# Patient Record
Sex: Female | Born: 1955 | Race: White | Hispanic: No | Smoking: Never smoker
Health system: Southern US, Community
[De-identification: ages and names within clinical notes are randomized; demographics above are authoritative.]

## PROBLEM LIST (undated history)

## (undated) DIAGNOSIS — IMO0002 Reserved for concepts with insufficient information to code with codable children: Secondary | ICD-10-CM

## (undated) DIAGNOSIS — I10 Essential (primary) hypertension: Secondary | ICD-10-CM

## (undated) DIAGNOSIS — T7840XA Allergy, unspecified, initial encounter: Secondary | ICD-10-CM

## (undated) DIAGNOSIS — Z01419 Encounter for gynecological examination (general) (routine) without abnormal findings: Secondary | ICD-10-CM

## (undated) DIAGNOSIS — E785 Hyperlipidemia, unspecified: Secondary | ICD-10-CM

## (undated) HISTORY — DX: Hyperlipidemia, unspecified: E78.5

## (undated) HISTORY — DX: Gilbert syndrome: E80.4

## (undated) HISTORY — DX: Essential (primary) hypertension: I10

## (undated) HISTORY — DX: Reserved for concepts with insufficient information to code with codable children: IMO0002

## (undated) HISTORY — PX: FRACTURE SURGERY: SHX138

## (undated) HISTORY — PX: TUBAL LIGATION: SHX77

## (undated) HISTORY — DX: Allergy, unspecified, initial encounter: T78.40XA

## (undated) HISTORY — DX: Encounter for gynecological examination (general) (routine) without abnormal findings: Z01.419

## (undated) HISTORY — PX: OTHER SURGICAL HISTORY: SHX169

---

## 2010-11-09 ENCOUNTER — Encounter: Payer: Self-pay | Admitting: Family Medicine

## 2010-11-09 ENCOUNTER — Ambulatory Visit (INDEPENDENT_AMBULATORY_CARE_PROVIDER_SITE_OTHER): Payer: BC Managed Care – PPO | Admitting: Family Medicine

## 2010-11-09 VITALS — BP 114/78 | HR 93 | Temp 97.3°F | Ht 66.0 in | Wt 135.0 lb

## 2010-11-09 DIAGNOSIS — E876 Hypokalemia: Secondary | ICD-10-CM

## 2010-11-09 DIAGNOSIS — Z0000 Encounter for general adult medical examination without abnormal findings: Secondary | ICD-10-CM

## 2010-11-09 DIAGNOSIS — Z23 Encounter for immunization: Secondary | ICD-10-CM

## 2010-11-09 LAB — CBC WITH DIFFERENTIAL/PLATELET
Basophils Relative: 0.7 % (ref 0.0–3.0)
Eosinophils Relative: 1.6 % (ref 0.0–5.0)
Hemoglobin: 14.9 g/dL (ref 12.0–15.0)
Lymphocytes Relative: 30.1 % (ref 12.0–46.0)
Monocytes Relative: 4.2 % (ref 3.0–12.0)
Neutro Abs: 3.8 10*3/uL (ref 1.4–7.7)
RBC: 4.61 Mil/uL (ref 3.87–5.11)
WBC: 6 10*3/uL (ref 4.5–10.5)

## 2010-11-09 LAB — BASIC METABOLIC PANEL
BUN: 16 mg/dL (ref 6–23)
GFR: 87.18 mL/min (ref >60.00)
Potassium: 2.9 mEq/L — ABNORMAL LOW (ref 3.5–5.1)

## 2010-11-09 LAB — TSH: TSH: 1.29 u[IU]/mL (ref 0.35–5.50)

## 2010-11-09 LAB — POCT URINALYSIS DIPSTICK
Bilirubin, UA: NEGATIVE
Ketones, UA: NEGATIVE
Leukocytes, UA: NEGATIVE
pH, UA: 7

## 2010-11-09 LAB — LIPID PANEL
HDL: 74.9 mg/dL (ref >39.00)
VLDL: 16 mg/dL (ref 0.0–40.0)

## 2010-11-09 LAB — HEPATIC FUNCTION PANEL
AST: 20 U/L (ref 0–37)
Total Bilirubin: 2.8 mg/dL — ABNORMAL HIGH (ref 0.3–1.2)

## 2010-11-09 MED ORDER — INDAPAMIDE 1.25 MG PO TABS: 1.2500 mg | ORAL_TABLET | Freq: Two times a day (BID) | ORAL | Status: DC

## 2010-11-09 NOTE — Progress Notes (Signed)
  Subjective:    Patient ID: Megan Middleton, female    DOB: 18-Apr-1955, 55 y.o.   MRN: [id number]  HPI 55 yr old female here to establish and for a cpx. She and her husband moved here from [place] last July. 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 She feels fine and has no complaints.    Review of Systems  Constitutional: Negative.   HENT: Negative.   Eyes: Negative.   Respiratory: Negative.   Cardiovascular: Negative.   Gastrointestinal: Negative.   Genitourinary: Negative for dysuria, urgency, frequency, hematuria, flank pain, decreased urine volume, enuresis, difficulty urinating, pelvic pain and dyspareunia.  Musculoskeletal: Negative.   Skin: Negative.   Neurological: Negative.   Hematological: Negative.   Psychiatric/Behavioral: Negative.        Objective:   Physical Exam  Constitutional: She is oriented to person, place, and time. She appears well-developed and well-nourished. No distress.  HENT:  Head: Normocephalic and atraumatic.  Right Ear: External ear normal.  Left Ear: External ear normal.  Nose: Nose normal.  Mouth/Throat: Oropharynx is clear and moist. No oropharyngeal exudate.  Eyes: Conjunctivae and EOM are normal. Pupils are equal, round, and reactive to light. No scleral icterus.  Neck: Normal range of motion. Neck supple. No JVD present. No thyromegaly present.  Cardiovascular: Normal rate, regular rhythm, normal heart sounds and intact distal pulses.  Exam reveals no gallop and no friction rub.   No murmur heard.      EKG normal except for LVH  Pulmonary/Chest: Effort normal and breath sounds normal. No respiratory distress. She has no wheezes. She has no rales. She exhibits no tenderness.  Abdominal: Soft. Bowel sounds are normal. She exhibits no distension and no mass. There is no tenderness. There is no rebound and no guarding.  Musculoskeletal: Normal range of motion. She exhibits no edema and no tenderness.    Lymphadenopathy:    She has no cervical adenopathy.  Neurological: She is alert and oriented to person, place, and time. She has normal reflexes. No cranial nerve deficit. She exhibits normal muscle tone. Coordination normal.  Skin: Skin is warm and dry. No rash noted. No erythema.  Psychiatric: She has a normal mood and affect. Her behavior is normal. Judgment and thought content normal.          Assessment & Plan:  Well exam. Get fasting labs today

## 2010-11-09 NOTE — Progress Notes (Signed)
Addended by: Aniceto Boss A on: 11/09/2010 04:16 PM   Modules accepted: Orders

## 2010-11-11 ENCOUNTER — Telehealth: Payer: Self-pay | Admitting: Family Medicine

## 2010-11-11 ENCOUNTER — Encounter: Payer: Self-pay | Admitting: Family Medicine

## 2010-11-11 MED ORDER — POTASSIUM CHLORIDE 20 MEQ PO PACK: 20.0000 meq | PACK | Freq: Every day | ORAL | Status: DC

## 2010-11-11 MED ORDER — POTASSIUM CHLORIDE 20 MEQ PO PACK: 20.0000 meq | PACK | Freq: Two times a day (BID) | ORAL | Status: DC

## 2010-11-11 NOTE — Progress Notes (Signed)
Addended by: Aniceto Boss A on: 11/11/2010 12:26 PM   Modules accepted: Orders

## 2010-11-11 NOTE — Telephone Encounter (Signed)
Message copied by Baldemar Friday on Fri Nov 11, 2010 12:26 PM ------      Message from: Gershon Crane A      Created: Thu Nov 10, 2010  1:25 PM       Normal except low potassium. Call in Klor-con 20 mEq a day for one year. Recheck a BMET in 30 days.

## 2010-11-11 NOTE — Telephone Encounter (Signed)
I spoke with pt and gave results, put a copy in mail, put a future lab order in and sent script e-scribe.

## 2010-11-14 ENCOUNTER — Other Ambulatory Visit: Payer: Self-pay | Admitting: *Deleted

## 2010-11-14 MED ORDER — POTASSIUM CHLORIDE CRYS ER 20 MEQ PO TBCR: 20.0000 meq | EXTENDED_RELEASE_TABLET | Freq: Every day | ORAL | Status: DC

## 2010-11-14 NOTE — Progress Notes (Signed)
Per patient request and VO Dr Clent Ridges, changed therapy to tablet form/SLS

## 2010-11-15 ENCOUNTER — Telehealth: Payer: Self-pay | Admitting: *Deleted

## 2010-11-15 NOTE — Telephone Encounter (Signed)
Request for lab results to be mailed. Done. Pt informed.

## 2010-12-05 ENCOUNTER — Ambulatory Visit: Payer: BC Managed Care – PPO | Admitting: Family Medicine

## 2010-12-08 ENCOUNTER — Other Ambulatory Visit: Payer: Self-pay | Admitting: Obstetrics and Gynecology

## 2010-12-08 DIAGNOSIS — N6009 Solitary cyst of unspecified breast: Secondary | ICD-10-CM

## 2010-12-09 ENCOUNTER — Other Ambulatory Visit (INDEPENDENT_AMBULATORY_CARE_PROVIDER_SITE_OTHER): Payer: BC Managed Care – PPO

## 2010-12-09 DIAGNOSIS — E876 Hypokalemia: Secondary | ICD-10-CM

## 2010-12-09 LAB — BASIC METABOLIC PANEL
BUN: 16 mg/dL (ref 6–23)
CO2: 30 mEq/L (ref 19–32)
Calcium: 9.6 mg/dL (ref 8.4–10.5)
Chloride: 100 mEq/L (ref 96–112)
Creatinine, Ser: 0.8 mg/dL (ref 0.4–1.2)

## 2010-12-12 ENCOUNTER — Other Ambulatory Visit: Payer: Self-pay | Admitting: Family Medicine

## 2010-12-12 MED ORDER — POTASSIUM CHLORIDE CRYS ER 20 MEQ PO TBCR: 20.0000 meq | EXTENDED_RELEASE_TABLET | Freq: Two times a day (BID) | ORAL | Status: DC

## 2010-12-12 NOTE — Progress Notes (Signed)
Quick Note:  Pt informed on home VM, will send to pt pharmacy ______

## 2011-01-02 ENCOUNTER — Ambulatory Visit
Admission: RE | Admit: 2011-01-02 | Discharge: 2011-01-02 | Disposition: A | Discharge status: Home / Self Care | Payer: BC Managed Care – PPO | Source: Ambulatory Visit | Attending: Obstetrics and Gynecology | Admitting: Obstetrics and Gynecology

## 2011-01-02 DIAGNOSIS — N6009 Solitary cyst of unspecified breast: Secondary | ICD-10-CM

## 2011-04-13 ENCOUNTER — Telehealth: Payer: Self-pay | Admitting: Family Medicine

## 2011-04-13 DIAGNOSIS — E876 Hypokalemia: Secondary | ICD-10-CM

## 2011-04-13 NOTE — Telephone Encounter (Signed)
Pt is sch for 05-15-2011

## 2011-04-13 NOTE — Telephone Encounter (Signed)
Pt stated she suppose to have a repeated BMET in 6 month from 11-2010. Can I sch? Pt potassium was low.

## 2011-04-13 NOTE — Telephone Encounter (Signed)
I went ahead and ordered the test, if you could just schedule her. Thanks

## 2011-05-04 ENCOUNTER — Other Ambulatory Visit: Payer: BC Managed Care – PPO

## 2011-05-11 ENCOUNTER — Other Ambulatory Visit (INDEPENDENT_AMBULATORY_CARE_PROVIDER_SITE_OTHER): Payer: BC Managed Care – PPO

## 2011-05-11 DIAGNOSIS — E876 Hypokalemia: Secondary | ICD-10-CM

## 2011-05-11 LAB — BASIC METABOLIC PANEL
BUN: 16 mg/dL (ref 6–23)
Chloride: 100 mEq/L (ref 96–112)
Creatinine, Ser: 0.7 mg/dL (ref 0.4–1.2)
Glucose, Bld: 91 mg/dL (ref 70–99)
Potassium: 3.5 mEq/L (ref 3.5–5.1)

## 2011-05-15 ENCOUNTER — Other Ambulatory Visit: Payer: BC Managed Care – PPO

## 2011-05-16 NOTE — Progress Notes (Signed)
Quick Note:  Pt informed ______ 

## 2011-11-17 ENCOUNTER — Other Ambulatory Visit: Payer: Self-pay | Admitting: Obstetrics and Gynecology

## 2011-11-17 DIAGNOSIS — Z1231 Encounter for screening mammogram for malignant neoplasm of breast: Secondary | ICD-10-CM

## 2011-11-24 ENCOUNTER — Other Ambulatory Visit: Payer: Self-pay | Admitting: Family Medicine

## 2011-12-05 ENCOUNTER — Encounter: Payer: Self-pay | Admitting: Family Medicine

## 2011-12-05 ENCOUNTER — Ambulatory Visit (INDEPENDENT_AMBULATORY_CARE_PROVIDER_SITE_OTHER): Payer: BC Managed Care – PPO | Admitting: Family Medicine

## 2011-12-05 VITALS — BP 118/78 | HR 85 | Temp 98.2°F | Ht 67.0 in | Wt 138.0 lb

## 2011-12-05 DIAGNOSIS — Z0000 Encounter for general adult medical examination without abnormal findings: Secondary | ICD-10-CM

## 2011-12-05 LAB — BASIC METABOLIC PANEL
Chloride: 101 mEq/L (ref 96–112)
Creatinine, Ser: 0.7 mg/dL (ref 0.4–1.2)
Potassium: 3.3 mEq/L — ABNORMAL LOW (ref 3.5–5.1)

## 2011-12-05 LAB — HEPATIC FUNCTION PANEL
ALT: 15 U/L (ref 0–35)
AST: 23 U/L (ref 0–37)
Bilirubin, Direct: 0.2 mg/dL (ref 0.0–0.3)
Total Bilirubin: 1.7 mg/dL — ABNORMAL HIGH (ref 0.3–1.2)

## 2011-12-05 LAB — LIPID PANEL
Total CHOL/HDL Ratio: 4
VLDL: 33.4 mg/dL (ref 0.0–40.0)

## 2011-12-05 LAB — CBC WITH DIFFERENTIAL/PLATELET
Basophils Absolute: 0.1 10*3/uL (ref 0.0–0.1)
Basophils Relative: 1.2 % (ref 0.0–3.0)
Eosinophils Relative: 2.4 % (ref 0.0–5.0)
HCT: 42.8 % (ref 36.0–46.0)
Hemoglobin: 14.4 g/dL (ref 12.0–15.0)
Lymphs Abs: 1.9 10*3/uL (ref 0.7–4.0)
Monocytes Relative: 4.9 % (ref 3.0–12.0)
Neutro Abs: 4.3 10*3/uL (ref 1.4–7.7)
RBC: 4.5 Mil/uL (ref 3.87–5.11)
RDW: 12.4 % (ref 11.5–14.6)

## 2011-12-05 LAB — TSH: TSH: 1.58 u[IU]/mL (ref 0.35–5.50)

## 2011-12-05 LAB — POCT URINALYSIS DIPSTICK
Ketones, UA: NEGATIVE
Protein, UA: NEGATIVE
Spec Grav, UA: 1.01

## 2011-12-05 MED ORDER — INDAPAMIDE 1.25 MG PO TABS: 2.5000 mg | ORAL_TABLET | Freq: Every day | ORAL | Status: DC

## 2011-12-05 NOTE — Progress Notes (Signed)
  Subjective:    Patient ID: Megan Middleton, female    DOB: 23-Jul-1955, 56 y.o.   MRN: [id number]  HPI 56 yr old female for a cpx. She feels well and has no concerns.    Review of Systems  Constitutional: Negative.  Negative for fever, diaphoresis, activity change, appetite change, fatigue and unexpected weight change.  HENT: Negative.  Negative for hearing loss, ear pain, nosebleeds, congestion, sore throat, trouble swallowing, neck pain, neck stiffness, voice change and tinnitus.   Eyes: Negative.  Negative for photophobia, pain, discharge, redness and visual disturbance.  Respiratory: Negative.  Negative for apnea, cough, choking, chest tightness, shortness of breath, wheezing and stridor.   Cardiovascular: Negative.  Negative for chest pain, palpitations and leg swelling.  Gastrointestinal: Negative.  Negative for nausea, vomiting, abdominal pain, diarrhea, constipation, blood in stool, abdominal distention and rectal pain.  Genitourinary: Negative.  Negative for dysuria, urgency, frequency, hematuria, flank pain, vaginal bleeding, vaginal discharge, enuresis, difficulty urinating, vaginal pain and menstrual problem.  Musculoskeletal: Negative.  Negative for myalgias, back pain, joint swelling, arthralgias and gait problem.  Skin: Negative.  Negative for color change, pallor, rash and wound.  Neurological: Negative.  Negative for dizziness, tremors, seizures, syncope, speech difficulty, weakness, light-headedness, numbness and headaches.  Hematological: Negative.  Negative for adenopathy. Does not bruise/bleed easily.  Psychiatric/Behavioral: Negative.  Negative for hallucinations, behavioral problems, confusion, sleep disturbance, dysphoric mood and agitation. The patient is not nervous/anxious.        Objective:   Physical Exam  Constitutional: She appears well-developed and well-nourished. No distress.  HENT:  Head: Normocephalic and atraumatic.  Right Ear: External ear normal.    Left Ear: External ear normal.  Nose: Nose normal.  Mouth/Throat: Oropharynx is clear and moist. No oropharyngeal exudate.  Eyes: Conjunctivae normal and EOM are normal. Pupils are equal, round, and reactive to light. Right eye exhibits no discharge. Left eye exhibits no discharge. No scleral icterus.  Neck: Normal range of motion. Neck supple. No JVD present. No thyromegaly present.  Cardiovascular: Normal rate, regular rhythm, normal heart sounds and intact distal pulses.  Exam reveals no gallop and no friction rub.   No murmur heard. Pulmonary/Chest: Effort normal and breath sounds normal. No stridor. No respiratory distress. She has no wheezes. She has no rales. She exhibits no tenderness.  Abdominal: Soft. Normal appearance and bowel sounds are normal. She exhibits no distension, no abdominal bruit, no ascites and no mass. There is no hepatosplenomegaly. There is no tenderness. There is no rigidity, no rebound and no guarding. No hernia.  Genitourinary: Rectum normal, vagina normal and uterus normal. No breast swelling, tenderness, discharge or bleeding. Cervix exhibits no motion tenderness, no discharge and no friability. Right adnexum displays no mass, no tenderness and no fullness. Left adnexum displays no mass, no tenderness and no fullness. No erythema, tenderness or bleeding around the vagina. No vaginal discharge found.  Musculoskeletal: Normal range of motion. She exhibits no edema and no tenderness.  Lymphadenopathy:    She has no cervical adenopathy.  Neurological: She is alert. She has normal reflexes. No cranial nerve deficit. She exhibits normal muscle tone. Coordination normal.  Skin: Skin is warm and dry. No rash noted. She is not diaphoretic. No erythema. No pallor.  Psychiatric: She has a normal mood and affect. Her behavior is normal. Judgment and thought content normal.          Assessment & Plan:  Well exam

## 2011-12-07 ENCOUNTER — Encounter: Payer: Self-pay | Admitting: Family Medicine

## 2011-12-07 MED ORDER — POTASSIUM CHLORIDE CRYS ER 20 MEQ PO TBCR
20.0000 meq | EXTENDED_RELEASE_TABLET | Freq: Every day | ORAL | Status: DC
Start: 2011-12-07 — End: 2012-12-05

## 2011-12-07 MED ORDER — POTASSIUM CHLORIDE CRYS ER 20 MEQ PO TBCR: 20.0000 meq | EXTENDED_RELEASE_TABLET | Freq: Every day | ORAL | Status: DC

## 2011-12-07 NOTE — Addendum Note (Signed)
Addended by: Aniceto Boss A on: 12/07/2011 11:28 AM   Modules accepted: Orders

## 2011-12-07 NOTE — Progress Notes (Signed)
Quick Note:  I spoke with pt and called in script, also put a copy of results in mail. ______

## 2011-12-07 NOTE — Addendum Note (Signed)
Addended by: Aniceto Boss A on: 12/07/2011 11:41 AM   Modules accepted: Orders

## 2012-01-03 ENCOUNTER — Ambulatory Visit: Payer: BC Managed Care – PPO

## 2012-01-19 ENCOUNTER — Ambulatory Visit
Admission: RE | Admit: 2012-01-19 | Discharge: 2012-01-19 | Disposition: A | Discharge status: Home / Self Care | Payer: BC Managed Care – PPO | Source: Ambulatory Visit | Attending: Obstetrics and Gynecology | Admitting: Obstetrics and Gynecology

## 2012-01-19 DIAGNOSIS — Z1231 Encounter for screening mammogram for malignant neoplasm of breast: Secondary | ICD-10-CM

## 2012-02-22 ENCOUNTER — Other Ambulatory Visit: Payer: Self-pay | Admitting: Family Medicine

## 2012-10-15 ENCOUNTER — Other Ambulatory Visit: Payer: Self-pay

## 2012-10-15 DIAGNOSIS — Z1231 Encounter for screening mammogram for malignant neoplasm of breast: Secondary | ICD-10-CM

## 2012-11-28 ENCOUNTER — Other Ambulatory Visit: Payer: Self-pay

## 2012-12-05 ENCOUNTER — Encounter: Payer: Self-pay | Admitting: Family Medicine

## 2012-12-05 ENCOUNTER — Ambulatory Visit (INDEPENDENT_AMBULATORY_CARE_PROVIDER_SITE_OTHER): Payer: BC Managed Care – PPO | Admitting: Family Medicine

## 2012-12-05 VITALS — BP 126/80 | HR 82 | Temp 98.4°F | Ht 66.5 in | Wt 138.0 lb

## 2012-12-05 DIAGNOSIS — Z0000 Encounter for general adult medical examination without abnormal findings: Secondary | ICD-10-CM

## 2012-12-05 DIAGNOSIS — E785 Hyperlipidemia, unspecified: Secondary | ICD-10-CM | POA: Insufficient documentation

## 2012-12-05 DIAGNOSIS — I10 Essential (primary) hypertension: Secondary | ICD-10-CM

## 2012-12-05 LAB — HEPATIC FUNCTION PANEL
ALT: 18 U/L (ref 0–35)
AST: 22 U/L (ref 0–37)
Albumin: 4.6 g/dL (ref 3.5–5.2)
Alkaline Phosphatase: 78 U/L (ref 39–117)
Bilirubin, Direct: 0.2 mg/dL (ref 0.0–0.3)
Total Protein: 7.5 g/dL (ref 6.0–8.3)

## 2012-12-05 LAB — CBC WITH DIFFERENTIAL/PLATELET
Basophils Absolute: 0 10*3/uL (ref 0.0–0.1)
Basophils Relative: 0.8 % (ref 0.0–3.0)
Eosinophils Absolute: 0.1 10*3/uL (ref 0.0–0.7)
Lymphocytes Relative: 32.6 % (ref 12.0–46.0)
Lymphs Abs: 1.8 10*3/uL (ref 0.7–4.0)
MCHC: 33.8 g/dL (ref 30.0–36.0)
MCV: 93.7 fl (ref 78.0–100.0)
Monocytes Absolute: 0.3 10*3/uL (ref 0.1–1.0)
Monocytes Relative: 5.2 % (ref 3.0–12.0)
Neutrophils Relative %: 58.7 % (ref 43.0–77.0)
RBC: 4.59 Mil/uL (ref 3.87–5.11)
RDW: 12.8 % (ref 11.5–14.6)

## 2012-12-05 LAB — POCT URINALYSIS DIPSTICK
Bilirubin, UA: NEGATIVE
Blood, UA: NEGATIVE
Leukocytes, UA: NEGATIVE
Nitrite, UA: NEGATIVE
Protein, UA: NEGATIVE
Urobilinogen, UA: 0.2
pH, UA: 7

## 2012-12-05 LAB — LIPID PANEL
Total CHOL/HDL Ratio: 3
Triglycerides: 76 mg/dL (ref 0.0–149.0)

## 2012-12-05 LAB — BASIC METABOLIC PANEL
CO2: 34 mEq/L — ABNORMAL HIGH (ref 19–32)
Calcium: 9.9 mg/dL (ref 8.4–10.5)
Chloride: 98 mEq/L (ref 96–112)
Glucose, Bld: 89 mg/dL (ref 70–99)
Sodium: 140 mEq/L (ref 135–145)

## 2012-12-05 LAB — TSH: TSH: 1.19 u[IU]/mL (ref 0.35–5.50)

## 2012-12-05 MED ORDER — POTASSIUM CHLORIDE CRYS ER 20 MEQ PO TBCR: 20.0000 meq | EXTENDED_RELEASE_TABLET | Freq: Every day | ORAL | Status: DC

## 2012-12-05 MED ORDER — INDAPAMIDE 1.25 MG PO TABS: ORAL_TABLET | ORAL | Status: DC

## 2012-12-05 NOTE — Progress Notes (Signed)
Pre visit review using our clinic review tool, if applicable. No additional management support is needed unless otherwise documented below in the visit note. 

## 2012-12-05 NOTE — Progress Notes (Signed)
  Subjective:    Patient ID: Megan Middleton, female    DOB: 30-Jun-1955, 57 y.o.   MRN: [id number]  HPI 57 yr old female for a cpx. She feels great.    Review of Systems  Constitutional: Negative.   HENT: Negative.   Eyes: Negative.   Respiratory: Negative.   Cardiovascular: Negative.   Gastrointestinal: Negative.   Genitourinary: Negative for dysuria, urgency, frequency, hematuria, flank pain, decreased urine volume, enuresis, difficulty urinating, pelvic pain and dyspareunia.  Musculoskeletal: Negative.   Skin: Negative.   Neurological: Negative.   Psychiatric/Behavioral: Negative.        Objective:   Physical Exam  Constitutional: She is oriented to person, place, and time. She appears well-developed and well-nourished. No distress.  HENT:  Head: Normocephalic and atraumatic.  Right Ear: External ear normal.  Left Ear: External ear normal.  Nose: Nose normal.  Mouth/Throat: Oropharynx is clear and moist. No oropharyngeal exudate.  Eyes: Conjunctivae and EOM are normal. Pupils are equal, round, and reactive to light. No scleral icterus.  Neck: Normal range of motion. Neck supple. No JVD present. No thyromegaly present.  Cardiovascular: Normal rate, regular rhythm, normal heart sounds and intact distal pulses.  Exam reveals no gallop and no friction rub.   No murmur heard. Pulmonary/Chest: Effort normal and breath sounds normal. No respiratory distress. She has no wheezes. She has no rales. She exhibits no tenderness.  Abdominal: Soft. Bowel sounds are normal. She exhibits no distension and no mass. There is no tenderness. There is no rebound and no guarding.  Musculoskeletal: Normal range of motion. She exhibits no edema and no tenderness.  Lymphadenopathy:    She has no cervical adenopathy.  Neurological: She is alert and oriented to person, place, and time. She has normal reflexes. No cranial nerve deficit. She exhibits normal muscle tone. Coordination normal.  Skin: Skin  is warm and dry. No rash noted. No erythema.  Psychiatric: She has a normal mood and affect. Her behavior is normal. Judgment and thought content normal.          Assessment & Plan:  Well exam. Get fasting labs

## 2012-12-11 ENCOUNTER — Other Ambulatory Visit: Payer: Self-pay | Admitting: Family Medicine

## 2013-01-21 ENCOUNTER — Ambulatory Visit
Admission: RE | Admit: 2013-01-21 | Discharge: 2013-01-21 | Disposition: A | Discharge status: Home / Self Care | Payer: BC Managed Care – PPO | Source: Ambulatory Visit

## 2013-01-21 DIAGNOSIS — Z1231 Encounter for screening mammogram for malignant neoplasm of breast: Secondary | ICD-10-CM

## 2013-01-23 DIAGNOSIS — D126 Benign neoplasm of colon, unspecified: Secondary | ICD-10-CM

## 2013-01-23 HISTORY — DX: Benign neoplasm of colon, unspecified: D12.6

## 2013-08-06 ENCOUNTER — Encounter: Payer: Self-pay | Admitting: Gastroenterology

## 2013-09-24 ENCOUNTER — Ambulatory Visit (AMBULATORY_SURGERY_CENTER): Payer: Self-pay | Admitting: *Deleted

## 2013-09-24 VITALS — Ht 67.5 in | Wt 141.0 lb

## 2013-09-24 DIAGNOSIS — Z1211 Encounter for screening for malignant neoplasm of colon: Secondary | ICD-10-CM

## 2013-09-24 MED ORDER — MOVIPREP 100 G PO SOLR: ORAL | Status: DC

## 2013-09-24 NOTE — Progress Notes (Signed)
Patient denies any allergies to eggs or soy. Patient denies any problems with anesthesia/sedation. Patient denies any oxygen use at home and does not take any diet/weight loss medications. EMMI education assisgned to patient on colonoscopy, this was explained and instructions given to patient. 

## 2013-10-02 ENCOUNTER — Encounter: Payer: Self-pay | Admitting: Gastroenterology

## 2013-10-08 ENCOUNTER — Encounter: Payer: Self-pay | Admitting: Gastroenterology

## 2013-10-08 ENCOUNTER — Ambulatory Visit (AMBULATORY_SURGERY_CENTER): Payer: BC Managed Care – PPO | Admitting: Gastroenterology

## 2013-10-08 VITALS — BP 120/82 | HR 67 | Temp 97.4°F | Resp 19 | Ht 67.5 in | Wt 141.0 lb

## 2013-10-08 DIAGNOSIS — D126 Benign neoplasm of colon, unspecified: Secondary | ICD-10-CM

## 2013-10-08 DIAGNOSIS — D123 Benign neoplasm of transverse colon: Secondary | ICD-10-CM

## 2013-10-08 DIAGNOSIS — D128 Benign neoplasm of rectum: Secondary | ICD-10-CM

## 2013-10-08 DIAGNOSIS — D129 Benign neoplasm of anus and anal canal: Secondary | ICD-10-CM

## 2013-10-08 DIAGNOSIS — Z1211 Encounter for screening for malignant neoplasm of colon: Secondary | ICD-10-CM

## 2013-10-08 HISTORY — PX: COLONOSCOPY: SHX174

## 2013-10-08 MED ORDER — SODIUM CHLORIDE 0.9 % IV SOLN: 500.0000 mL | INTRAVENOUS | Status: DC

## 2013-10-08 NOTE — Op Note (Signed)
[place]  [address]. [place], [id number]   COLONOSCOPY PROCEDURE REPORT  PATIENT: Megan Middleton, Megan Middleton  MR#: [id number] BIRTHDATE: Jan 23, 1956 , 68  yrs. old GENDER: Female ENDOSCOPIST: Ladene Artist, MD, [facility] REFERRED LT:[id number] Raymon Mutton, M.D. PROCEDURE DATE:  10/08/2013 PROCEDURE:   Colonoscopy with biopsy and snare polypectomy First Screening Colonoscopy - Avg.  risk and is 50 yrs.  old or older Yes.  Prior Negative Screening - Now for repeat screening. N/A  History of Adenoma - Now for follow-up colonoscopy & has been > or = to 3 yrs.  N/A  Polyps Removed Today? Yes. ASA CLASS:   Class II INDICATIONS:average risk screening. MEDICATIONS: MAC sedation, administered by CRNA and propofol (Diprivan) 400mg  IV DESCRIPTION OF PROCEDURE:   After the risks benefits and alternatives of the procedure were thoroughly explained, informed consent was obtained.  A digital rectal exam revealed no abnormalities of the rectum.   The LB [id number]  endoscope was introduced through the anus and advanced to the cecum, which was identified by both the appendix and ileocecal valve. No adverse events experienced.   The quality of the prep was excellent, using MoviPrep  The instrument was then slowly withdrawn as the colon was fully examined.  COLON FINDINGS: A sessile polyp measuring 6 mm in size was found in the transverse colon.  A polypectomy was performed with a cold snare.  The resection was complete and the polyp tissue was completely retrieved.   A sessile polyp measuring 3 mm in size was found in the rectum.  A polypectomy was performed with cold forceps.  The resection was complete and the polyp tissue was completely retrieved.   The colon was otherwise normal.  There was no diverticulosis, inflammation, polyps or cancers unless previously stated.  Retroflexed views revealed small internal hemorrhoids. The time to cecum=2 minutes 48 seconds.  Withdrawal time=10  minutes 40 seconds.  The scope was withdrawn and the procedure completed.  COMPLICATIONS: There were no complications.  ENDOSCOPIC IMPRESSION: 1.   Sessile polyp measuring 6 mm in the transverse colon; polypectomy performed with a cold snare 2.   Sessile polyp measuring 3 mm in the rectum; polypectomy performed with cold forceps 3.   Small internal hemorrhoids  RECOMMENDATIONS: 1.  Await pathology results 2.  Repeat colonoscopy in 5 years if polyp(s) adenomatous; otherwise 10 years  eSigned:  Ladene Artist, MD, [facility] 10/08/2013 8:54 AM

## 2013-10-08 NOTE — Progress Notes (Signed)
Called to room to assist during endoscopic procedure.  Patient ID and intended procedure confirmed with present staff. Received instructions for my participation in the procedure from the performing physician.  

## 2013-10-08 NOTE — Patient Instructions (Signed)

## 2013-10-08 NOTE — Progress Notes (Signed)
Patient awakening,vss,report to rn 

## 2013-10-09 ENCOUNTER — Telehealth: Payer: Self-pay | Admitting: *Deleted

## 2013-10-09 NOTE — Telephone Encounter (Signed)
  Follow up Call-  Call back number 10/08/2013  Post procedure Call Back phone  # home [phone]  Permission to leave phone message Yes     Patient questions:  Do you have a fever, pain , or abdominal swelling? No. Pain Score  0 *  Have you tolerated food without any problems? Yes.    Have you been able to return to your normal activities? Yes.    Do you have any questions about your discharge instructions: Diet   No. Medications  No. Follow up visit  No.  Do you have questions or concerns about your Care? No.  Actions: * If pain score is 4 or above: No action needed, pain <4.  Pt. Stated that we are great and she thanked [place].

## 2013-10-13 ENCOUNTER — Encounter: Payer: Self-pay | Admitting: Gastroenterology

## 2013-12-13 ENCOUNTER — Other Ambulatory Visit: Payer: Self-pay | Admitting: Family Medicine

## 2013-12-15 ENCOUNTER — Encounter: Payer: BC Managed Care – PPO | Admitting: Family Medicine

## 2013-12-16 ENCOUNTER — Telehealth: Payer: Self-pay | Admitting: Family Medicine

## 2013-12-16 NOTE — Telephone Encounter (Signed)
Pt needs refill on klor-con [facility] [employer]

## 2013-12-17 MED ORDER — POTASSIUM CHLORIDE CRYS ER 20 MEQ PO TBCR: 20.0000 meq | EXTENDED_RELEASE_TABLET | Freq: Every day | ORAL | Status: DC

## 2013-12-17 NOTE — Telephone Encounter (Signed)
I sent script e-scribe. 

## 2014-01-13 NOTE — Telephone Encounter (Signed)
Cindy please close phone note

## 2014-01-22 ENCOUNTER — Ambulatory Visit (INDEPENDENT_AMBULATORY_CARE_PROVIDER_SITE_OTHER): Payer: BC Managed Care – PPO | Admitting: Family Medicine

## 2014-01-22 ENCOUNTER — Encounter: Payer: Self-pay | Admitting: Family Medicine

## 2014-01-22 VITALS — BP 135/85 | HR 81 | Temp 99.0°F | Ht 67.5 in | Wt 139.0 lb

## 2014-01-22 DIAGNOSIS — Z0000 Encounter for general adult medical examination without abnormal findings: Secondary | ICD-10-CM

## 2014-01-22 LAB — BASIC METABOLIC PANEL
BUN: 17 mg/dL (ref 6–23)
CALCIUM: 9.6 mg/dL (ref 8.4–10.5)
CO2: 32 meq/L (ref 19–32)
Chloride: 97 mEq/L (ref 96–112)
Creatinine, Ser: 0.7 mg/dL (ref 0.4–1.2)
GFR: 84.88 mL/min (ref >60.00)
GLUCOSE: 96 mg/dL (ref 70–99)
Potassium: 3.5 mEq/L (ref 3.5–5.1)
SODIUM: 137 meq/L (ref 135–145)

## 2014-01-22 LAB — POCT URINALYSIS DIPSTICK
Bilirubin, UA: NEGATIVE
GLUCOSE UA: NEGATIVE
Ketones, UA: NEGATIVE
Leukocytes, UA: NEGATIVE
Nitrite, UA: NEGATIVE
PROTEIN UA: NEGATIVE
RBC UA: NEGATIVE
SPEC GRAV UA: 1.01
UROBILINOGEN UA: 0.2
pH, UA: 6

## 2014-01-22 LAB — CBC WITH DIFFERENTIAL/PLATELET
BASOS ABS: 0.1 10*3/uL (ref 0.0–0.1)
BASOS PCT: 0.8 % (ref 0.0–3.0)
EOS ABS: 0.2 10*3/uL (ref 0.0–0.7)
Eosinophils Relative: 2.5 % (ref 0.0–5.0)
HCT: 44.1 % (ref 36.0–46.0)
Hemoglobin: 15 g/dL (ref 12.0–15.0)
LYMPHS PCT: 22 % (ref 12.0–46.0)
Lymphs Abs: 1.9 10*3/uL (ref 0.7–4.0)
MCHC: 33.9 g/dL (ref 30.0–36.0)
MCV: 92.3 fl (ref 78.0–100.0)
MONO ABS: 0.4 10*3/uL (ref 0.1–1.0)
Monocytes Relative: 5 % (ref 3.0–12.0)
NEUTROS PCT: 69.7 % (ref 43.0–77.0)
Neutro Abs: 5.9 10*3/uL (ref 1.4–7.7)
Platelets: 279 10*3/uL (ref 150.0–400.0)
RBC: 4.78 Mil/uL (ref 3.87–5.11)
RDW: 13.1 % (ref 11.5–15.5)
WBC: 8.5 10*3/uL (ref 4.0–10.5)

## 2014-01-22 LAB — HEPATIC FUNCTION PANEL
ALT: 16 U/L (ref 0–35)
AST: 18 U/L (ref 0–37)
Albumin: 4.4 g/dL (ref 3.5–5.2)
Alkaline Phosphatase: 85 U/L (ref 39–117)
BILIRUBIN TOTAL: 3.6 mg/dL — AB (ref 0.2–1.2)
Bilirubin, Direct: 0.1 mg/dL (ref 0.0–0.3)
Total Protein: 7.5 g/dL (ref 6.0–8.3)

## 2014-01-22 LAB — LIPID PANEL
Cholesterol: 235 mg/dL — ABNORMAL HIGH (ref 0–200)
HDL: 58.7 mg/dL (ref >39.00)
LDL CALC: 155 mg/dL — AB (ref 0–99)
NonHDL: 176.3
TRIGLYCERIDES: 105 mg/dL (ref 0.0–149.0)
Total CHOL/HDL Ratio: 4
VLDL: 21 mg/dL (ref 0.0–40.0)

## 2014-01-22 LAB — TSH: TSH: 0.64 u[IU]/mL (ref 0.35–4.50)

## 2014-01-22 MED ORDER — INDAPAMIDE 1.25 MG PO TABS: ORAL_TABLET | ORAL | Status: DC

## 2014-01-22 MED ORDER — POTASSIUM CHLORIDE CRYS ER 20 MEQ PO TBCR
20.0000 meq | EXTENDED_RELEASE_TABLET | Freq: Every day | ORAL | Status: DC
Start: 2014-01-22 — End: 2015-01-31

## 2014-01-22 NOTE — Progress Notes (Signed)
   Subjective:    Patient ID: Megan Middleton, female    DOB: [birth year], 58 y.o.   MRN: [id number]  HPI 58 yr old female for a cpx. She feels well.    Review of Systems  Constitutional: Negative.  Negative for fever, diaphoresis, activity change, appetite change, fatigue and unexpected weight change.  HENT: Negative.  Negative for congestion, ear pain, hearing loss, nosebleeds, sore throat, tinnitus, trouble swallowing and voice change.   Eyes: Negative.  Negative for photophobia, pain, discharge, redness and visual disturbance.  Respiratory: Negative.  Negative for apnea, cough, choking, chest tightness, shortness of breath, wheezing and stridor.   Cardiovascular: Negative.  Negative for chest pain, palpitations and leg swelling.  Gastrointestinal: Negative.  Negative for nausea, vomiting, abdominal pain, diarrhea, constipation, blood in stool, abdominal distention and rectal pain.  Genitourinary: Negative.  Negative for dysuria, urgency, frequency, hematuria, flank pain, decreased urine volume, vaginal bleeding, vaginal discharge, enuresis, difficulty urinating, vaginal pain, menstrual problem, pelvic pain and dyspareunia.  Musculoskeletal: Negative.  Negative for myalgias, back pain, joint swelling, arthralgias, gait problem, neck pain and neck stiffness.  Skin: Negative.  Negative for color change, pallor, rash and wound.  Neurological: Negative.  Negative for dizziness, tremors, seizures, syncope, speech difficulty, weakness, light-headedness, numbness and headaches.  Hematological: Negative for adenopathy. Does not bruise/bleed easily.  Psychiatric/Behavioral: Negative.  Negative for hallucinations, behavioral problems, confusion, sleep disturbance, dysphoric mood and agitation. The patient is not nervous/anxious.        Objective:   Physical Exam  Constitutional: She is oriented to person, place, and time. She appears well-developed and well-nourished. No distress.  HENT:  Head:  Normocephalic and atraumatic.  Right Ear: External ear normal.  Left Ear: External ear normal.  Nose: Nose normal.  Mouth/Throat: Oropharynx is clear and moist. No oropharyngeal exudate.  Eyes: Conjunctivae and EOM are normal. Pupils are equal, round, and reactive to light. No scleral icterus.  Neck: Normal range of motion. Neck supple. No JVD present. No thyromegaly present.  Cardiovascular: Normal rate, regular rhythm, normal heart sounds and intact distal pulses.  Exam reveals no gallop and no friction rub.   No murmur heard. EKG normal   Pulmonary/Chest: Effort normal and breath sounds normal. No respiratory distress. She has no wheezes. She has no rales. She exhibits no tenderness.  Abdominal: Soft. Bowel sounds are normal. She exhibits no distension and no mass. There is no tenderness. There is no rebound and no guarding.  Musculoskeletal: Normal range of motion. She exhibits no edema or tenderness.  Lymphadenopathy:    She has no cervical adenopathy.  Neurological: She is alert and oriented to person, place, and time. She has normal reflexes. No cranial nerve deficit. She exhibits normal muscle tone. Coordination normal.  Skin: Skin is warm and dry. No rash noted. No erythema.  Psychiatric: She has a normal mood and affect. Her behavior is normal. Judgment and thought content normal.          Assessment & Plan:  Well exam. Get fasting labs

## 2014-01-22 NOTE — Progress Notes (Signed)
Pre visit review using our clinic review tool, if applicable. No additional management support is needed unless otherwise documented below in the visit note. 

## 2014-02-05 ENCOUNTER — Other Ambulatory Visit: Payer: Self-pay | Admitting: Obstetrics and Gynecology

## 2014-02-05 DIAGNOSIS — R928 Other abnormal and inconclusive findings on diagnostic imaging of breast: Secondary | ICD-10-CM

## 2014-02-10 ENCOUNTER — Ambulatory Visit
Admission: RE | Admit: 2014-02-10 | Discharge: 2014-02-10 | Disposition: A | Discharge status: Home / Self Care | Payer: BLUE CROSS/BLUE SHIELD | Source: Ambulatory Visit | Attending: Obstetrics and Gynecology | Admitting: Obstetrics and Gynecology

## 2014-02-10 DIAGNOSIS — R928 Other abnormal and inconclusive findings on diagnostic imaging of breast: Secondary | ICD-10-CM

## 2014-03-08 ENCOUNTER — Emergency Department (HOSPITAL_BASED_OUTPATIENT_CLINIC_OR_DEPARTMENT_OTHER)
Admission: EM | Admit: 2014-03-08 | Discharge: 2014-03-08 | Disposition: A | Discharge status: Home / Self Care | Payer: BLUE CROSS/BLUE SHIELD | Attending: Emergency Medicine | Admitting: Emergency Medicine

## 2014-03-08 ENCOUNTER — Encounter (HOSPITAL_BASED_OUTPATIENT_CLINIC_OR_DEPARTMENT_OTHER): Payer: Self-pay

## 2014-03-08 ENCOUNTER — Emergency Department (HOSPITAL_BASED_OUTPATIENT_CLINIC_OR_DEPARTMENT_OTHER): Payer: BLUE CROSS/BLUE SHIELD

## 2014-03-08 DIAGNOSIS — Z8639 Personal history of other endocrine, nutritional and metabolic disease: Secondary | ICD-10-CM | POA: Diagnosis not present

## 2014-03-08 DIAGNOSIS — Y9289 Other specified places as the place of occurrence of the external cause: Secondary | ICD-10-CM | POA: Insufficient documentation

## 2014-03-08 DIAGNOSIS — Y998 Other external cause status: Secondary | ICD-10-CM | POA: Insufficient documentation

## 2014-03-08 DIAGNOSIS — W25XXXA Contact with sharp glass, initial encounter: Secondary | ICD-10-CM | POA: Insufficient documentation

## 2014-03-08 DIAGNOSIS — Z79899 Other long term (current) drug therapy: Secondary | ICD-10-CM | POA: Insufficient documentation

## 2014-03-08 DIAGNOSIS — S51011A Laceration without foreign body of right elbow, initial encounter: Secondary | ICD-10-CM | POA: Insufficient documentation

## 2014-03-08 DIAGNOSIS — S40021A Contusion of right upper arm, initial encounter: Secondary | ICD-10-CM | POA: Diagnosis not present

## 2014-03-08 DIAGNOSIS — I10 Essential (primary) hypertension: Secondary | ICD-10-CM | POA: Diagnosis not present

## 2014-03-08 DIAGNOSIS — Y9389 Activity, other specified: Secondary | ICD-10-CM | POA: Diagnosis not present

## 2014-03-08 MED ORDER — LIDOCAINE HCL (PF) 1 % IJ SOLN
5.0000 mL | Freq: Once | INTRAMUSCULAR | Status: AC
  Administered 2014-03-08: 5 mL

## 2014-03-08 MED ORDER — LIDOCAINE HCL (PF) 1 % IJ SOLN
INTRAMUSCULAR | Status: AC
  Administered 2014-03-08: 5 mL
  Filled 2014-03-08: qty 5

## 2014-03-08 MED ORDER — TETANUS-DIPHTH-ACELL PERTUSSIS 5-2.5-18.5 LF-MCG/0.5 IM SUSP
0.5000 mL | Freq: Once | INTRAMUSCULAR | Status: DC
  Filled 2014-03-08: qty 0.5

## 2014-03-08 NOTE — ED Notes (Signed)
D/c home with family- ice pack given for home use

## 2014-03-08 NOTE — Discharge Instructions (Signed)
Please read and follow all provided instructions.  Your diagnoses today include:  1. Traumatic ecchymosis of right upper arm, initial encounter   2. Laceration of right elbow, initial encounter     Tests performed today include:  X-ray of the affected area that did not show any foreign bodies or broken bones  Vital signs. See below for your results today.   Medications prescribed:   None  Take any prescribed medications only as directed.   Home care instructions:  Follow any educational materials and wound care instructions contained in this packet.   Keep affected area above the level of your heart when possible to minimize swelling. Wash area gently twice a day with warm soapy water. Do not apply alcohol or hydrogen peroxide. Cover the area if it draining or weeping.   Follow-up instructions: Suture Removal: Return to the Emergency Department or see your primary care care doctor in 10 days for a recheck of your wound and removal of your sutures or staples.    Return instructions:  Return to the Emergency Department if you have:  Fever  Worsening pain  Worsening swelling of the wound  Pus draining from the wound  Redness of the skin that moves away from the wound, especially if it streaks away from the affected area   Any other emergent concerns  Your vital signs today were: BP 157/58 mmHg   Pulse 76   Temp(Src) 98.5 F (36.9 C) (Oral)   Resp 16   Ht 5\' 8"  (1.727 m)   Wt 137 lb (62.143 kg)   BMI 20.84 kg/m2   SpO2 100% If your blood pressure (BP) was elevated above 135/85 this visit, please have this repeated by your doctor within one month. --------------

## 2014-03-08 NOTE — ED Provider Notes (Signed)
CSN: [id number]     Arrival date & time 03/08/14  1203 History   First MD Initiated Contact with Patient 03/08/14 1222     Chief Complaint  Patient presents with  . Fall     (Consider location/radiation/quality/duration/timing/severity/associated sxs/prior Treatment) HPI Comments: Patient presents with complaint of right elbow injury and laceration as well as right upper arm bruise. Approximately 9:50 AM, patient was carrying a crockpot. She tripped and fell landing on her right upper arm and elbow. She is uncertain if she was cut by glass from the broken crockpot or just hit it on the floor. No other injury. No known head or neck injury. Wound was rinsed and wrapped. Patient now presents for further evaluation and wound closure. Tetanus is not up-to-date.  The history is provided by the patient.    Past Medical History  Diagnosis Date  . Hyperlipidemia   . Hypertension   . Gilberts syndrome   . Gynecological examination     sees Dr. [name]    Past Surgical History  Procedure Laterality Date  . Colonoscopy  10-08-13    per Dr. Fuller Plan, adenomatous polyps, repear in 5 yrs  . Tubal ligation     Family History  Problem Relation Age of Onset  . Hypertension Father   . Breast cancer Sister   . Lymphoma Mother   . Leukemia Mother   . Colon cancer Neg Hx   . Esophageal cancer Neg Hx   . Rectal cancer Neg Hx   . Stomach cancer Neg Hx    History  Substance Use Topics  . Smoking status: Never Smoker   . Smokeless tobacco: Never Used  . Alcohol Use: 0.0 oz/week    0 Standard drinks or equivalent per week     Comment: occ   OB History    No data available     Review of Systems  Constitutional: Negative for activity change.  Musculoskeletal: Positive for joint swelling and arthralgias. Negative for back pain and neck pain.  Skin: Positive for color change and wound.  Neurological: Negative for weakness and numbness.    Allergies  Review of patient's allergies  indicates no known allergies.  Home Medications   Prior to Admission medications   Medication Sig Start Date End Date Taking? Authorizing Provider  calcium carbonate (OS-CAL) 600 MG TABS Take 600 mg by mouth daily. With vitamin d    Historical Provider, MD  fish oil-omega-3 fatty acids 1000 MG capsule Take 2 g by mouth daily. Triple strength omega complex    Historical Provider, MD  indapamide (LOZOL) 1.25 MG tablet TAKE 1 TABLET (1.25 MG TOTAL) BY MOUTH 2 (TWO) TIMES DAILY. 01/22/14   Laurey Morale, MD  Multiple Vitamin (MULTIVITAMIN) tablet Take 1 tablet by mouth daily.      Historical Provider, MD  potassium chloride SA (KLOR-CON M20) 20 MEQ tablet Take 1 tablet (20 mEq total) by mouth daily. 01/22/14   Laurey Morale, MD   BP 157/58 mmHg  Pulse 76  Temp(Src) 98.5 F (36.9 C) (Oral)  Resp 16  Ht 5\' 8"  (1.727 m)  Wt 137 lb (62.143 kg)  BMI 20.84 kg/m2  SpO2 100%   Physical Exam  Constitutional: She appears well-developed and well-nourished.  HENT:  Head: Normocephalic and atraumatic.  Eyes: Pupils are equal, round, and reactive to light.  Neck: Normal range of motion. Neck supple.  Cardiovascular: Exam reveals no decreased pulses.   Pulses:      Radial pulses are  2+ on the right side.  Musculoskeletal: She exhibits tenderness. She exhibits no edema.       Right shoulder: Normal. She exhibits normal range of motion and no tenderness.       Right elbow: She exhibits swelling and laceration (2 cm, linear, full skin thickness, hemostatic, clean). She exhibits normal range of motion, no effusion and no deformity. Tenderness found. Olecranon process (mild) tenderness noted.       Right wrist: Normal.       Cervical back: Normal. She exhibits normal range of motion and no tenderness.       Right upper arm: She exhibits tenderness (over ecchymosis).       Right forearm: Normal.       Right hand: Normal.  Wound base explored, no foreign bodies or debris seen. Wound extends to the  olecranon process. No deformity of the bone detected.  Neurological: She is alert. No sensory deficit.  Motor, sensation, and vascular distal to the injury is fully intact.   Skin: Skin is warm and dry.  Psychiatric: She has a normal mood and affect.  Nursing note and vitals reviewed.   ED Course  Procedures (including critical care time) Labs Review Labs Reviewed - No data to display  Imaging Review Dg Elbow Complete Right  03/08/2014   CLINICAL DATA:  Acute fall with right elbow laceration from glass with pain. Initial encounter.  EXAM: RIGHT ELBOW - COMPLETE 3+ VIEW  COMPARISON:  None.  FINDINGS: There is no evidence of acute fracture, subluxation or dislocation.  There is no evidence of joint effusion.  Posterior soft tissue swelling with gas is identified compatible with soft tissue injury/ laceration.  No radiopaque foreign bodies are identified.  IMPRESSION: Posterior soft tissue injury/laceration without acute bony abnormality or radiopaque foreign body.   Electronically Signed   By: Margarette Canada M.D.   On: 03/08/2014 13:39     EKG Interpretation None      12:51 PM Patient seen and examined. Work-up initiated. Will x-ray and clean wound/close elbow laceration.    Vital signs reviewed and are as follows: BP 157/58 mmHg  Pulse 76  Temp(Src) 98.5 F (36.9 C) (Oral)  Resp 16  Ht 5\' 8"  (1.727 m)  Wt 137 lb (62.143 kg)  BMI 20.84 kg/m2  SpO2 100%   2:21 PM x-ray negative. Patient now remembers that she had Tdap 3 years ago. Wound cleaned and repaired.  LACERATION REPAIR Performed by: Katherine Mantle PA-S Authorized by: Faustino Congress Consent: Verbal consent obtained. Risks and benefits: risks, benefits and alternatives were discussed Consent given by: patient Patient identity confirmed: provided demographic data Prepped and Draped in normal sterile fashion Wound explored  Laceration Location: right elbow  Laceration Length: 2cm  No Foreign Bodies seen  or palpated  Anesthesia: local infiltration  Local anesthetic: lidocaine 1% without epinephrine  Anesthetic total: 4 ml  Irrigation method: skin scrub with dermal cleanser Amount of cleaning: standard  Skin closure: 4-0 Ethilon  Number of sutures: 2  Technique: simple interrupted  Patient tolerance: Patient tolerated the procedure well with no immediate complications.   2:22 PM Patient counseled on wound care. Patient counseled on need to return or see PCP/urgent care for suture removal in 10 days. Patient was urged to return to the Emergency Department urgently with worsening pain, swelling, expanding erythema especially if it streaks away from the affected area, fever, or if they have any other concerns. Patient verbalized understanding.     MDM  Final diagnoses:  Traumatic ecchymosis of right upper arm, initial encounter  Laceration of right elbow, initial encounter   Patient with injury and laceration as described. Wound repaired without complication. No neurovascular compromise suspected. No foreign bodies visualized or seen on x-ray. Tetanus is up-to-date. Do not suspect other injury including head or neck injury.    Carlisle Cater, PA-C 03/08/14 [place], MD 03/08/14 [phone]

## 2014-03-08 NOTE — ED Notes (Signed)
MD at bedside. 

## 2014-03-08 NOTE — ED Notes (Signed)
Pa at bedside to repair laceration

## 2014-03-08 NOTE — ED Notes (Signed)
Pt reports slipped and fell in the kitchen while carrying crock pot.  Pt has lac to right elbow and unsure if from hitting the floor or glass from the crock pot. Denies LOC or hitting her head.

## 2014-03-19 ENCOUNTER — Ambulatory Visit (INDEPENDENT_AMBULATORY_CARE_PROVIDER_SITE_OTHER): Payer: BLUE CROSS/BLUE SHIELD | Admitting: Family Medicine

## 2014-03-19 ENCOUNTER — Encounter: Payer: Self-pay | Admitting: Family Medicine

## 2014-03-19 VITALS — BP 126/81 | HR 82 | Temp 98.6°F | Ht 68.0 in | Wt 139.0 lb

## 2014-03-19 DIAGNOSIS — S51011D Laceration without foreign body of right elbow, subsequent encounter: Secondary | ICD-10-CM

## 2014-03-19 NOTE — Progress Notes (Signed)
   Subjective:    Patient ID: Megan Middleton, female    DOB: 29-Jun-1955, 59 y.o.   MRN: [id number]  HPI Here to check her right elbow after she fell and landed on her kitchen floor on 03-08-14. She had 2 sutures placed in the ED that day. She also bruised the right upper arm and this ahs been healing well.    Review of Systems  Constitutional: Negative.   Skin: Positive for wound.       Objective:   Physical Exam  Constitutional: She appears well-developed and well-nourished.  Musculoskeletal:  The right upper arm has an ecchymotic area but is not swollen or tender. The elbow is not tender and has full ROM. The wound is healing well          Assessment & Plan:  Both sutures were removed. Recheck prn

## 2014-03-19 NOTE — Progress Notes (Signed)
Pre visit review using our clinic review tool, if applicable. No additional management support is needed unless otherwise documented below in the visit note. 

## 2014-04-13 ENCOUNTER — Ambulatory Visit: Payer: BLUE CROSS/BLUE SHIELD | Admitting: Family Medicine

## 2014-08-18 ENCOUNTER — Telehealth: Payer: Self-pay | Admitting: Family Medicine

## 2014-08-18 NOTE — Telephone Encounter (Signed)
Pt call to say that she need a written statement saying that she can not buy the following med over the counter  potassium chloride SA (KLOR-CON M20) 20 MEQ tablet

## 2014-08-18 NOTE — Telephone Encounter (Signed)
I do not understand this request, of course you cannot buy this OTC (not at this dose)

## 2014-08-19 NOTE — Telephone Encounter (Signed)
The letter is ready  

## 2014-08-19 NOTE — Telephone Encounter (Signed)
Pt insurance company told her that she could buy the potassium chloride sa klor-con m20 over the counter. The pharmacy told her that she could not and that is what she told her insurance company. Her insurance company told her they needed a note from her doctor stating that the medicine can not be bought over the counter.   Hope this message is a lot clearer

## 2014-08-20 NOTE — Telephone Encounter (Signed)
I spoke with pt and put in mail, per pt request.

## 2014-09-14 ENCOUNTER — Encounter: Payer: Self-pay | Admitting: Family Medicine

## 2014-09-14 ENCOUNTER — Ambulatory Visit (INDEPENDENT_AMBULATORY_CARE_PROVIDER_SITE_OTHER): Payer: BLUE CROSS/BLUE SHIELD | Admitting: Family Medicine

## 2014-09-14 VITALS — BP 137/86 | HR 88 | Temp 99.0°F | Ht 66.0 in | Wt 140.0 lb

## 2014-09-14 DIAGNOSIS — R591 Generalized enlarged lymph nodes: Secondary | ICD-10-CM

## 2014-09-14 NOTE — Progress Notes (Signed)
Pre visit review using our clinic review tool, if applicable. No additional management support is needed unless otherwise documented below in the visit note. 

## 2014-09-14 NOTE — Progress Notes (Signed)
   Subjective:    Patient ID: Megan Middleton, female    DOB: [birth year], 59 y.o.   MRN: [id number]  HPI Here to check a small lump in front of the right ear that she noticed about 6 months ago. This has never been tender and does not bother her. It has not grown in size at all. She recently went to Urgent Care for a sinus infection and they felt this lump. They were a little worried and told her to see [place]. She has not had any other swellings in the area.    Review of Systems  Constitutional: Negative.   HENT: Positive for congestion, postnasal drip and sinus pressure. Negative for sore throat, trouble swallowing and voice change.   Eyes: Negative.   Respiratory: Negative.   Cardiovascular: Negative.   Skin: Negative for rash.       Objective:   Physical Exam  Constitutional: She is oriented to person, place, and time. She appears well-developed and well-nourished. No distress.  HENT:  Right Ear: External ear normal.  Left Ear: External ear normal.  Nose: Nose normal.  Mouth/Throat: Oropharynx is clear and moist.  Single small mobile firm non-tender mass in front of the right ear.   Eyes: Conjunctivae are normal.  Neck: Neck supple. No thyromegaly present.  Cardiovascular: Normal rate, regular rhythm, normal heart sounds and intact distal pulses.   Pulmonary/Chest: Effort normal and breath sounds normal.  Lymphadenopathy:    She has no cervical adenopathy.  Neurological: She is alert and oriented to person, place, and time.          Assessment & Plan:  This is a benign preauricular lymph node. I reassured her that no further workup is needed unless it starts to get larger or becomes tender, or if other nodes swell up. Recheck prn

## 2015-01-13 ENCOUNTER — Other Ambulatory Visit: Payer: Self-pay

## 2015-01-13 DIAGNOSIS — Z1231 Encounter for screening mammogram for malignant neoplasm of breast: Secondary | ICD-10-CM

## 2015-01-27 ENCOUNTER — Encounter: Payer: BLUE CROSS/BLUE SHIELD | Admitting: Family Medicine

## 2015-01-31 ENCOUNTER — Other Ambulatory Visit: Payer: Self-pay | Admitting: Family Medicine

## 2015-03-10 ENCOUNTER — Ambulatory Visit
Admission: RE | Admit: 2015-03-10 | Discharge: 2015-03-10 | Disposition: A | Discharge status: Home / Self Care | Payer: BLUE CROSS/BLUE SHIELD | Source: Ambulatory Visit

## 2015-03-10 DIAGNOSIS — Z1231 Encounter for screening mammogram for malignant neoplasm of breast: Secondary | ICD-10-CM

## 2015-03-22 ENCOUNTER — Ambulatory Visit (INDEPENDENT_AMBULATORY_CARE_PROVIDER_SITE_OTHER): Payer: BLUE CROSS/BLUE SHIELD | Admitting: Family

## 2015-03-22 ENCOUNTER — Encounter: Payer: Self-pay | Admitting: Family

## 2015-03-22 ENCOUNTER — Telehealth: Payer: Self-pay | Admitting: Family

## 2015-03-22 VITALS — BP 154/73 | HR 80 | Temp 98.4°F | Resp 16 | Ht 67.8 in | Wt 135.8 lb

## 2015-03-22 DIAGNOSIS — E876 Hypokalemia: Secondary | ICD-10-CM

## 2015-03-22 DIAGNOSIS — Z0000 Encounter for general adult medical examination without abnormal findings: Secondary | ICD-10-CM | POA: Diagnosis not present

## 2015-03-22 LAB — URINALYSIS, ROUTINE W REFLEX MICROSCOPIC
BILIRUBIN URINE: NEGATIVE
Hgb urine dipstick: NEGATIVE
Ketones, ur: NEGATIVE
LEUKOCYTES UA: NEGATIVE
Nitrite: NEGATIVE
RBC / HPF: NONE SEEN (ref 0–?)
Specific Gravity, Urine: <=1.005 — AB (ref 1.000–1.030)
Total Protein, Urine: NEGATIVE
Urine Glucose: NEGATIVE
Urobilinogen, UA: 0.2 (ref 0.0–1.0)
WBC, UA: NONE SEEN (ref 0–?)
pH: 6.5 (ref 5.0–8.0)

## 2015-03-22 LAB — HEPATIC FUNCTION PANEL
ALT: 14 U/L (ref 0–35)
AST: 19 U/L (ref 0–37)
Albumin: 5 g/dL (ref 3.5–5.2)
Alkaline Phosphatase: 76 U/L (ref 39–117)
BILIRUBIN DIRECT: 0.3 mg/dL (ref 0.0–0.3)
Total Bilirubin: 2.9 mg/dL — ABNORMAL HIGH (ref 0.2–1.2)
Total Protein: 7.5 g/dL (ref 6.0–8.3)

## 2015-03-22 LAB — CBC WITH DIFFERENTIAL/PLATELET
BASOS ABS: 0 10*3/uL (ref 0.0–0.1)
Basophils Relative: 0.7 % (ref 0.0–3.0)
EOS ABS: 0.1 10*3/uL (ref 0.0–0.7)
Eosinophils Relative: 2.2 % (ref 0.0–5.0)
HEMATOCRIT: 42.4 % (ref 36.0–46.0)
Hemoglobin: 14.6 g/dL (ref 12.0–15.0)
LYMPHS ABS: 1.3 10*3/uL (ref 0.7–4.0)
LYMPHS PCT: 23.1 % (ref 12.0–46.0)
MCHC: 34.4 g/dL (ref 30.0–36.0)
MCV: 91.3 fl (ref 78.0–100.0)
MONOS PCT: 4.2 % (ref 3.0–12.0)
Monocytes Absolute: 0.2 10*3/uL (ref 0.1–1.0)
NEUTROS ABS: 3.9 10*3/uL (ref 1.4–7.7)
NEUTROS PCT: 69.8 % (ref 43.0–77.0)
PLATELETS: 256 10*3/uL (ref 150.0–400.0)
RBC: 4.64 Mil/uL (ref 3.87–5.11)
RDW: 13.3 % (ref 11.5–15.5)
WBC: 5.6 10*3/uL (ref 4.0–10.5)

## 2015-03-22 LAB — BASIC METABOLIC PANEL
BUN: 13 mg/dL (ref 6–23)
CALCIUM: 9.7 mg/dL (ref 8.4–10.5)
CO2: 31 mEq/L (ref 19–32)
Chloride: 100 mEq/L (ref 96–112)
Creatinine, Ser: 0.71 mg/dL (ref 0.40–1.20)
GFR: 88.69 mL/min (ref >60.00)
Glucose, Bld: 92 mg/dL (ref 70–99)
Potassium: 3.2 mEq/L — ABNORMAL LOW (ref 3.5–5.1)
SODIUM: 141 meq/L (ref 135–145)

## 2015-03-22 LAB — LIPID PANEL
CHOLESTEROL: 220 mg/dL — AB (ref 0–200)
HDL: 74.6 mg/dL (ref >39.00)
LDL Cholesterol: 123 mg/dL — ABNORMAL HIGH (ref 0–99)
NonHDL: 145.04
Total CHOL/HDL Ratio: 3
Triglycerides: 108 mg/dL (ref 0.0–149.0)
VLDL: 21.6 mg/dL (ref 0.0–40.0)

## 2015-03-22 LAB — TSH: TSH: 1.37 u[IU]/mL (ref 0.35–4.50)

## 2015-03-22 NOTE — Telephone Encounter (Signed)
Notified pt and she voices understanding. States she takes potassium 5 out of 7 days. Per verbal from PCP, pt should take 2 tablets today then resume 1 tablet daily every day.  Lab appt scheduled for 03/29/15 at 7:30am. Future order entered.

## 2015-03-22 NOTE — Progress Notes (Signed)
Pre visit review using our clinic review tool, if applicable. No additional management support is needed unless otherwise documented below in the visit note. 

## 2015-03-22 NOTE — Patient Instructions (Addendum)
Please check blood pressure once daily for 1 week and e-mail me your readings in 1 week.  Continue healthy diet and exercise. Follow up in 3 months for follow up blood pressure check.

## 2015-03-22 NOTE — Telephone Encounter (Addendum)
Please let pt know that I reviewed her lab work.  Bilirubin is up due to hx of gilbert's syndrome.  Not worrisome.  K+ low, likely due to diuretic use. Is patient taking K dur? If not taking take 2 tabs by mouth today then one tablet daily.  If taking once daily already, increase to bid.  Repeat BMET in 1 week.  Dx hypokalemia. Cholesterol mildly elevated- please work on low fat/low cholesterol diet and continue regular exercise.

## 2015-03-22 NOTE — Assessment & Plan Note (Signed)
Immunizations reviewed and up to date. Declines dexa scan.  Colo, pap,  mammo up to date.

## 2015-03-22 NOTE — Progress Notes (Addendum)
Subjective:    Patient ID: Megan Middleton, female    DOB: [birth year], 60 y.o.   MRN: [id number]  HPI  Megan Middleton is a 60 yr old female who presents today to establish care. She was previously followed by Megan Middleton at our [place] location.   Patient presents today for complete physical.  Immunizations: Tdap and zoster up to date.  Diet: reports diet is healthy Exercise: walks regularly, has weights at home.  Colonoscopy: 2015- adenomatous polyps, rec 2020 follow up colo per Megan Middleton Dexa: due- declines Pap Smear: 03/09/14 Mammogram:03/10/15- normal Dental: up to date Eye: up to date  She uses Indapamide.    BP Readings from Last 3 Encounters:  03/22/15 154/73  09/14/14 137/86  03/19/14 126/81    Assessment & Middleton:   Past Medical History  Diagnosis Date  . Hyperlipidemia   . Hypertension   . Gilberts syndrome   . Gynecological examination     sees Megan Middleton   . Squamous cell carcinoma ([place])     Sees Derm Megan Middleton    Social History   Social History  . Marital Status: Married    Spouse Name: N/A  . Number of Children: N/A  . Years of Education: N/A   Occupational History  . Not on file.   Social History Main Topics  . Smoking status: Never Smoker   . Smokeless tobacco: Never Used  . Alcohol Use: 0.0 oz/week    0 Standard drinks or equivalent per week     Comment: occ  . Drug Use: No  . Sexual Activity: Not on file   Other Topics Concern  . Not on file   Social History Narrative    Past Surgical History  Procedure Laterality Date  . Colonoscopy  10-08-13    per Megan Middleton, adenomatous polyps, repear in 5 yrs  . Tubal ligation      Family History  Problem Relation Age of Onset  . Hypertension Father   . Breast cancer Sister   . Lymphoma Mother   . Leukemia Mother   . Colon cancer Neg Hx   . Esophageal cancer Neg Hx   . Rectal cancer Neg Hx   . Stomach cancer Neg Hx     No Known Allergies  Current Outpatient  Prescriptions on File Prior to Visit  Medication Sig Dispense Refill  . calcium carbonate (OS-CAL) 600 MG TABS Take 600 mg by mouth daily. With vitamin d    . fish oil-omega-3 fatty acids 1000 MG capsule Take 2 g by mouth daily. Triple strength omega complex    . indapamide (LOZOL) 1.25 MG tablet TAKE 1 TABLET (1.25 MG TOTAL) BY MOUTH 2 (TWO) TIMES DAILY. 60 tablet 11  . KLOR-CON M20 20 MEQ tablet TAKE 1 TABLET (20 MEQ TOTAL) BY MOUTH DAILY. 30 tablet 11  . Multiple Vitamin (MULTIVITAMIN) tablet Take 1 tablet by mouth daily.       No current facility-administered medications on file prior to visit.    BP 154/73 mmHg  Pulse 80  Temp(Src) 98.4 F (36.9 C) (Oral)  Resp 16  Ht 5' 7.8" (1.722 m)  Wt 135 lb 12.8 oz (61.598 kg)  BMI 20.77 kg/m2  SpO2 100%        Review of Systems  Constitutional: Negative for unexpected weight change.  HENT: Negative for hearing loss and rhinorrhea.   Eyes: Negative for visual disturbance.  Respiratory: Negative for cough.   Cardiovascular: Negative for leg swelling.  Gastrointestinal: Negative for diarrhea, constipation and blood in stool.  Genitourinary: Negative for dysuria and frequency.  Musculoskeletal: Negative for myalgias and arthralgias.  Skin: Negative for rash.  Neurological: Negative for headaches.  Hematological: Negative for adenopathy.  Psychiatric/Behavioral:       Denies depression/anxiety   Past Medical History  Diagnosis Date  . Hyperlipidemia   . Hypertension   . Gilberts syndrome   . Gynecological examination     sees Megan Middleton   . Squamous cell carcinoma ([place])     Sees Derm Megan Middleton    Social History   Social History  . Marital Status: Married    Spouse Name: N/A  . Number of Children: N/A  . Years of Education: N/A   Occupational History  . Not on file.   Social History Main Topics  . Smoking status: Never Smoker   . Smokeless tobacco: Never Used  . Alcohol Use: 0.0 oz/week    0  Standard drinks or equivalent per week     Comment: occ  . Drug Use: No  . Sexual Activity: Not on file   Other Topics Concern  . 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 Colonoscopy  10-08-13    per Megan Middleton, adenomatous polyps, repear in 5 yrs  . Tubal ligation      Family History  Problem Relation Age of Onset  . Hypertension Father   . Breast cancer Sister   . Lymphoma Mother   . Leukemia Mother   . Colon cancer Neg Hx   . Esophageal cancer Neg Hx   . Rectal cancer Neg Hx   . Stomach cancer Neg Hx     No Known Allergies  Current Outpatient Prescriptions on File Prior to Visit  Medication Sig Dispense Refill  . calcium carbonate (OS-CAL) 600 MG TABS Take 600 mg by mouth daily. With vitamin d    . fish oil-omega-3 fatty acids 1000 MG capsule Take 2 g by mouth daily. Triple strength omega complex    . indapamide (LOZOL) 1.25 MG tablet TAKE 1 TABLET (1.25 MG TOTAL) BY MOUTH 2 (TWO) TIMES DAILY. 60 tablet 11  . KLOR-CON M20 20 MEQ tablet TAKE 1 TABLET (20 MEQ TOTAL) BY MOUTH DAILY. 30 tablet 11  . Multiple Vitamin (MULTIVITAMIN) tablet Take 1 tablet by mouth daily.       No current facility-administered medications on file prior to visit.    BP 154/73 mmHg  Pulse 80  Temp(Src) 98.4 F (36.9 C) (Oral)  Resp 16  Ht 5' 7.8" (1.722 m)  Wt 135 lb 12.8 oz (61.598 kg)  BMI 20.77 kg/m2  SpO2 100%       Objective:   Physical Exam  Physical Exam  Constitutional: She is oriented to person, place, and time. She appears well-developed and well-nourished. No distress.  HENT:  Head: Normocephalic and atraumatic.  Right Ear: Tympanic membrane and ear canal normal.  Left Ear: Tympanic membrane and ear  canal normal.  Mouth/Throat: Oropharynx is clear and moist.  Eyes: Pupils are equal, round, and reactive to light. No scleral icterus.  Neck: Normal range of motion. No thyromegaly present.  Cardiovascular: Normal rate and regular rhythm.  No murmur heard. Pulmonary/Chest: Effort normal and breath sounds normal. No respiratory distress. He has no wheezes. She has no rales. She exhibits no tenderness.  Abdominal: Soft. Bowel sounds are normal. He exhibits no distension and no mass. There is no tenderness. There is no rebound and no guarding.  Musculoskeletal: She exhibits no edema.  Lymphadenopathy:    She has no cervical adenopathy.  Neurological: She is alert and oriented to person, place, and time. She has normal reflexes. She exhibits normal muscle tone. Coordination normal.  Skin: Skin is warm and dry.  Psychiatric: She has a normal mood and affect. Her behavior is normal. Judgment and thought content normal.  Breast/pelvic: deferred to GYN         Assessment & Middleton:         Assessment & Middleton:  EKG tracing is personally reviewed.  EKG notes NSR.  No acute changes.

## 2015-03-29 ENCOUNTER — Telehealth: Payer: Self-pay | Admitting: Family

## 2015-03-29 ENCOUNTER — Other Ambulatory Visit (INDEPENDENT_AMBULATORY_CARE_PROVIDER_SITE_OTHER): Payer: BLUE CROSS/BLUE SHIELD

## 2015-03-29 ENCOUNTER — Telehealth: Payer: Self-pay | Admitting: *Deleted

## 2015-03-29 DIAGNOSIS — E876 Hypokalemia: Secondary | ICD-10-CM | POA: Diagnosis not present

## 2015-03-29 LAB — BASIC METABOLIC PANEL
BUN: 14 mg/dL (ref 6–23)
CO2: 30 mEq/L (ref 19–32)
CREATININE: 0.75 mg/dL (ref 0.40–1.20)
Calcium: 9.7 mg/dL (ref 8.4–10.5)
Chloride: 98 mEq/L (ref 96–112)
GFR: 83.25 mL/min (ref >60.00)
Glucose, Bld: 95 mg/dL (ref 70–99)
Potassium: 3.2 mEq/L — ABNORMAL LOW (ref 3.5–5.1)
Sodium: 138 mEq/L (ref 135–145)

## 2015-03-29 NOTE — Telephone Encounter (Signed)
Please let pt know that K+ is low. Please confirm pt is taking 8mEQ Kdur daily.  If so,  I would like her to increase K+ to 43meq po bid and repeat bmet in 1 week.

## 2015-03-29 NOTE — Telephone Encounter (Signed)
Received mailed letter with list of BP readings; forwarded to provider/SLS 03/06

## 2015-03-30 MED ORDER — POTASSIUM CHLORIDE CRYS ER 20 MEQ PO TBCR: 20.0000 meq | EXTENDED_RELEASE_TABLET | Freq: Two times a day (BID) | ORAL | Status: DC

## 2015-03-30 NOTE — Telephone Encounter (Signed)
Spoke with Pt, informed her of lab results, she is using KCl daily, instructed to increase to bid (Rx sent), and recheck labs in 1 week. Pt has scheduled lab appt for 04/05/2015 at 0700. Pt questioned whether Melissa has reviewed BP she mailed/dropped off yesterday, informed her I see in chart where they have been forwarded to her but do not believe she has reviewed. Pt verbalized understanding.

## 2015-04-05 ENCOUNTER — Other Ambulatory Visit (INDEPENDENT_AMBULATORY_CARE_PROVIDER_SITE_OTHER): Payer: BLUE CROSS/BLUE SHIELD

## 2015-04-05 ENCOUNTER — Other Ambulatory Visit: Payer: BLUE CROSS/BLUE SHIELD

## 2015-04-05 ENCOUNTER — Telehealth: Payer: Self-pay | Admitting: Family

## 2015-04-05 DIAGNOSIS — E876 Hypokalemia: Secondary | ICD-10-CM

## 2015-04-05 LAB — BASIC METABOLIC PANEL
BUN: 20 mg/dL (ref 6–23)
CALCIUM: 9.5 mg/dL (ref 8.4–10.5)
CHLORIDE: 101 meq/L (ref 96–112)
CO2: 30 meq/L (ref 19–32)
Creatinine, Ser: 0.74 mg/dL (ref 0.40–1.20)
GFR: 84.54 mL/min (ref >60.00)
GLUCOSE: 99 mg/dL (ref 70–99)
Potassium: 3.3 mEq/L — ABNORMAL LOW (ref 3.5–5.1)
Sodium: 141 mEq/L (ref 135–145)

## 2015-04-05 MED ORDER — SPIRONOLACTONE 25 MG PO TABS: 25.0000 mg | ORAL_TABLET | Freq: Every day | ORAL | Status: DC

## 2015-04-05 NOTE — Telephone Encounter (Signed)
See mychart.  

## 2015-04-05 NOTE — Telephone Encounter (Addendum)
Spoke with pt. She reports that she has had some issues with swelling in the past prior to being placed on diuretic. Advised pt as follows due to hypokalemia:  Take Kdur tonight, then d/c kdur, d/c indapamide Begin Aldactone in AM. Send me BP readings via mychart. Repeat bmet in 2 weeks.   Pt verbalizes understanding.

## 2015-04-05 NOTE — Addendum Note (Signed)
Addended by: [name] on: 04/05/2015 03:31 PM   Modules accepted: Orders, Medications

## 2015-04-19 ENCOUNTER — Telehealth: Payer: Self-pay | Admitting: *Deleted

## 2015-04-19 ENCOUNTER — Other Ambulatory Visit (INDEPENDENT_AMBULATORY_CARE_PROVIDER_SITE_OTHER): Payer: BLUE CROSS/BLUE SHIELD

## 2015-04-19 DIAGNOSIS — E876 Hypokalemia: Secondary | ICD-10-CM

## 2015-04-19 LAB — BASIC METABOLIC PANEL
BUN: 20 mg/dL (ref 6–23)
CO2: 29 mEq/L (ref 19–32)
Calcium: 9.8 mg/dL (ref 8.4–10.5)
Chloride: 103 mEq/L (ref 96–112)
Creatinine, Ser: 0.81 mg/dL (ref 0.40–1.20)
GFR: 76.16 mL/min (ref >60.00)
Glucose, Bld: 103 mg/dL — ABNORMAL HIGH (ref 70–99)
Potassium: 3.6 mEq/L (ref 3.5–5.1)
SODIUM: 141 meq/L (ref 135–145)

## 2015-04-19 NOTE — Telephone Encounter (Signed)
Patient brought in listing of blood pressure readings; forwarded to provider/SLS 03/27

## 2015-04-29 ENCOUNTER — Telehealth: Payer: Self-pay | Admitting: Family

## 2015-04-29 NOTE — Telephone Encounter (Signed)
[id number] [address], [place] [phone] (Phone) [phone] (Fax)         Reason for call:  [facility] called stating there's a drug interaction regarding the following medication KLOR-CON M20 20 MEQ tablet and spironolactone (ALDACTONE) 25 MG tablet. Please advise

## 2015-04-29 NOTE — Telephone Encounter (Signed)
Called pharmacy and they stated that they had spoken to the patient.  Pt informed them that she is no longer on Klor-Con.  Nurse verified the same.  No further concerns voiced.

## 2015-06-11 ENCOUNTER — Telehealth: Payer: Self-pay | Admitting: Family

## 2015-06-18 ENCOUNTER — Ambulatory Visit: Payer: BLUE CROSS/BLUE SHIELD | Admitting: Family

## 2015-06-22 ENCOUNTER — Ambulatory Visit: Payer: BLUE CROSS/BLUE SHIELD | Admitting: Family

## 2015-06-25 ENCOUNTER — Encounter: Payer: Self-pay | Admitting: Family

## 2015-06-25 ENCOUNTER — Ambulatory Visit (INDEPENDENT_AMBULATORY_CARE_PROVIDER_SITE_OTHER): Payer: BLUE CROSS/BLUE SHIELD | Admitting: Family

## 2015-06-25 VITALS — BP 130/78 | HR 75 | Temp 98.5°F | Resp 18 | Ht 67.5 in | Wt 126.0 lb

## 2015-06-25 DIAGNOSIS — I10 Essential (primary) hypertension: Secondary | ICD-10-CM

## 2015-06-25 LAB — BASIC METABOLIC PANEL
BUN: 16 mg/dL (ref 6–23)
CALCIUM: 9.6 mg/dL (ref 8.4–10.5)
CO2: 30 meq/L (ref 19–32)
CREATININE: 0.8 mg/dL (ref 0.40–1.20)
Chloride: 102 mEq/L (ref 96–112)
GFR: 77.21 mL/min (ref >60.00)
Glucose, Bld: 102 mg/dL — ABNORMAL HIGH (ref 70–99)
Potassium: 3.7 mEq/L (ref 3.5–5.1)
Sodium: 141 mEq/L (ref 135–145)

## 2015-06-25 NOTE — Patient Instructions (Signed)
Continue aldactone 

## 2015-06-25 NOTE — Progress Notes (Signed)
Subjective:    Patient ID: Megan Middleton, female    DOB: Oct 17, 1955, 60 y.o.   MRN: [id number]  HPI  [name] is a 60 yr old female who presents today for follow up.  HTN- currently maintained on aldactone.  Denies CP/SOB or swelling.Feels well on medication. Home BP's have been stable.  Her husband passed away in 28-Apr-2022.  She reports that she is doing well and trying to stay busy.    BP Readings from Last 3 Encounters:  06/25/15 130/78  03/22/15 154/73  09/14/14 137/86   Review of Systems    see HPI  Past Medical History  Diagnosis Date  . Hyperlipidemia   . Hypertension   . Gilberts syndrome   . Gynecological examination     sees Dr. [name]   . Squamous cell carcinoma ([place])     Sees Derm [name]     Social History   Social History  . Marital Status: Married    Spouse Name: N/A  . Number of Children: N/A  . Years of Education: N/A   Occupational History  . Not on file.   Social History Main Topics  . Smoking status: Never Smoker   . Smokeless tobacco: Never Used  . Alcohol Use: 0.0 oz/week    0 Standard drinks or equivalent per week     Comment: occ  . Drug Use: No  . Sexual Activity: Not on file   Other Topics Concern  . 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 Colonoscopy  10-08-13    per Dr. Fuller Plan, adenomatous polyps, repear in 5 yrs  . Tubal ligation      Family History  Problem Relation Age of Onset  . Hypertension Father   . Breast cancer Sister   . Lymphoma Mother   . Leukemia Mother   . Colon cancer Neg Hx   . Esophageal cancer Neg Hx   . Rectal cancer Neg Hx   . Stomach cancer Neg Hx     No Known Allergies  Current Outpatient Prescriptions on  File Prior to Visit  Medication Sig Dispense Refill  . calcium carbonate (OS-CAL) 600 MG TABS Take 600 mg by mouth daily. With vitamin d    . fish oil-omega-3 fatty acids 1000 MG capsule Take 2 g by mouth daily. Triple strength omega complex    . Lactobacillus Rhamnosus, GG, (CULTURELLE PO) Take 1 tablet by mouth daily.    . Multiple Vitamin (MULTIVITAMIN) tablet Take 1 tablet by mouth daily.      Marland Kitchen spironolactone (ALDACTONE) 25 MG tablet Take 1 tablet (25 mg total) by mouth daily. 30 tablet 3   No current facility-administered medications on file prior to visit.    BP 130/78 mmHg  Pulse 75  Temp(Src) 98.5 F (36.9 C) (Oral)  Resp 18  Ht 5' 7.5" (1.715 m)  Wt 126 lb (57.153 kg)  BMI 19.43 kg/m2  SpO2 100%    Objective:   Physical Exam  Constitutional: She is oriented to person, place, and time. She appears well-developed and well-nourished.  HENT:  Head: Normocephalic and atraumatic.  Cardiovascular: Normal rate, regular rhythm and normal heart sounds.   No murmur  heard. Pulmonary/Chest: Effort normal and breath sounds normal. No respiratory distress. She has no wheezes.  Musculoskeletal: She exhibits no edema.  Neurological: She is alert and oriented to person, place, and time.  Skin: Skin is warm and dry.  Psychiatric: She has a normal mood and affect. Her behavior is normal. Judgment and thought content normal.          Assessment & Plan:

## 2015-06-25 NOTE — Assessment & Plan Note (Signed)
Improved on aldactone. Continue same. Repeat bmet to reassess K+.

## 2015-06-25 NOTE — Addendum Note (Signed)
Addended by: Emi Holes on: 06/25/2015 03:23 PM   Modules accepted: Miquel Dunn

## 2015-06-25 NOTE — Progress Notes (Signed)
Pre visit review using our clinic review tool, if applicable. No additional management support is needed unless otherwise documented below in the visit note. 

## 2015-07-21 ENCOUNTER — Other Ambulatory Visit: Payer: Self-pay | Admitting: Family

## 2015-08-10 NOTE — Telephone Encounter (Signed)
Error

## 2015-12-02 ENCOUNTER — Telehealth: Payer: Self-pay | Admitting: Family

## 2015-12-02 NOTE — Telephone Encounter (Signed)
Patient Name: Megan Middleton  DOB: 1955-09-08    Initial Comment BP is elevated 122/69 at 815- it has fluctuated over the last few days - She is schedule for MOnday   Nurse Assessment  Nurse: Raphael Gibney, RN, Vanita Ingles Date/Time (Eastern Time): 12/02/2015 9:45:29 AM  Confirm and document reason for call. If symptomatic, describe symptoms. You must click the next button to save text entered. ---Caller states she had stomach virus last week. At that time, she was not eating or drinking much. Felt like she had a "bolt of lightening in her eye" last week. She ate and drank and felt better. saw eye doctor this week and eye exam was normal. BP was elevated at the eye doctor Does not take antihypertensives. BP has been fluctuating. BP was 122/69 at 8:15 am. BP 149/79 last night.  Has the patient traveled out of the country within the last 30 days? ---Not Applicable  Does the patient have any new or worsening symptoms? ---Yes  Will a triage be completed? ---Yes  Related visit to physician within the last 2 weeks? ---No  Does the PT have any chronic conditions? (i.e. diabetes, asthma, etc.) ---No  Is this a behavioral health or substance abuse call? ---No     Guidelines    Guideline Title Affirmed Question Affirmed Notes  High Blood Pressure Wants doctor to measure BP    Final Disposition User   See PCP within 2 Maudry Diego, RN, Vanita Ingles    Comments  pt reports she has appt scheduled for Monday.   Disagree/Comply: Comply

## 2015-12-02 NOTE — Telephone Encounter (Signed)
Pt has an appt scheduled with her PCP on Monday, 12/06/15, at 11 am.

## 2015-12-06 ENCOUNTER — Ambulatory Visit (INDEPENDENT_AMBULATORY_CARE_PROVIDER_SITE_OTHER): Payer: BLUE CROSS/BLUE SHIELD | Admitting: Family

## 2015-12-06 ENCOUNTER — Encounter: Payer: Self-pay | Admitting: Family

## 2015-12-06 VITALS — BP 142/70 | HR 94 | Temp 97.9°F | Resp 16 | Ht 68.0 in | Wt 125.4 lb

## 2015-12-06 DIAGNOSIS — R634 Abnormal weight loss: Secondary | ICD-10-CM

## 2015-12-06 DIAGNOSIS — H539 Unspecified visual disturbance: Secondary | ICD-10-CM | POA: Diagnosis not present

## 2015-12-06 DIAGNOSIS — I10 Essential (primary) hypertension: Secondary | ICD-10-CM | POA: Diagnosis not present

## 2015-12-06 MED ORDER — AMLODIPINE BESYLATE 2.5 MG PO TABS: 2.5000 mg | ORAL_TABLET | Freq: Every day | ORAL | 3 refills | Status: DC

## 2015-12-06 NOTE — Patient Instructions (Signed)
Add amlodipine 2.5 mg once daily. Continue amlodipine.

## 2015-12-06 NOTE — Progress Notes (Signed)
Subjective:    Patient ID: Megan Middleton, female    DOB: May 02, 1955, 60 y.o.   MRN: [id number]  HPI  Megan Middleton is a 60 yr old with HTN.  She brings with her today a record of her home blood pressures. About half of her systolic pressures are above 140.  Reports that a few weeks back she had a 15 second episode of "flashing lights" in her left eye. Did not have any associated numbness/weakness, drooping, slurred speech or loss of vision. Had a eye exam which was reportedly normal. Reports that the eye doctor told her that it may have been due to a migraine.  BP Readings from Last 3 Encounters:  12/06/15 (!) 142/70  06/25/15 130/78  03/22/15 (!) 154/73   She has had some weight loss since her husband died. She attributes this to not entertaining and "having happy hour" as she used to when her husband was alive.   Wt Readings from Last 3 Encounters:  12/06/15 125 lb 6.4 oz (56.9 kg)  06/25/15 126 lb (57.2 kg)  03/22/15 135 lb 12.8 oz (61.6 kg)     Review of Systems See HPI Past Medical History:  Diagnosis Date  . Gilberts syndrome   . Gynecological examination    sees Dr. [name]   . Hyperlipidemia   . Hypertension   . Squamous cell carcinoma    Sees Derm [name]     Social History   Social History  . Marital status: Married    Spouse name: N/A  . Number of children: N/A  . Years of education: N/A   Occupational History  . Not on file.   Social History Main Topics  . Smoking status: Never Smoker  . Smokeless tobacco: Never Used  . Alcohol use 0.0 oz/week     Comment: occ  . Drug use: No  . Sexual activity: Not on file   Other Topics Concern  . 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 COLONOSCOPY  10-08-13   per Dr. Fuller Plan, adenomatous polyps, repear in 5 yrs  . TUBAL LIGATION      Family History  Problem Relation Age of Onset  . Hypertension Father   . Breast cancer Sister   . Lymphoma Mother   . Leukemia Mother   . Colon cancer Neg Hx   . Esophageal cancer Neg Hx   . Rectal cancer Neg Hx   . Stomach cancer Neg Hx     No Known Allergies  Current Outpatient Prescriptions on File Prior to Visit  Medication Sig Dispense Refill  . calcium carbonate (OS-CAL) 600 MG TABS Take 600 mg by mouth daily. With vitamin d    . fish oil-omega-3 fatty acids 1000 MG capsule Take 2 g by mouth daily. Triple strength omega complex    . Lactobacillus Rhamnosus, GG, (CULTURELLE PO) Take 1 tablet by mouth daily.    . Multiple Vitamin (MULTIVITAMIN) tablet Take 1 tablet by mouth daily.      Marland Kitchen spironolactone (ALDACTONE) 25 MG tablet TAKE 1 TABLET (25 MG TOTAL) BY MOUTH DAILY. 30 tablet 5   No current facility-administered medications on file prior to visit.  BP (!) 142/70 (BP Location: Right Arm, Patient Position: Sitting, Cuff Size: Normal)   Pulse 94   Temp 97.9 F (36.6 C) (Oral)   Resp 16   Ht 5\' 8"  (1.727 m)   Wt 125 lb 6.4 oz (56.9 kg)   SpO2 100% Comment: RA  BMI 19.07 kg/m       Objective:   Physical Exam  Constitutional: She is oriented to person, place, and time. She appears well-developed and well-nourished.  HENT:  Head: Normocephalic and atraumatic.  Cardiovascular: Normal rate, regular rhythm and normal heart sounds.   No murmur heard. Pulmonary/Chest: Effort normal and breath sounds normal. No respiratory distress. She has no wheezes.  Neurological: She is alert and oriented to person, place, and time.  Psychiatric: She has a normal mood and affect. Her behavior is normal. Judgment and thought content normal.          Assessment & Plan:  Weight loss- stress and situational per hx. We discussed adding an ensure or protein shake  to help keep her calories up.

## 2015-12-06 NOTE — Assessment & Plan Note (Signed)
SBP slightly above goal. Continue aldactone. Add amlodipine 2.5 mg once daily.

## 2015-12-06 NOTE — Progress Notes (Signed)
Pre visit review using our clinic review tool, if applicable. No additional management support is needed unless otherwise documented below in the visit note. 

## 2015-12-06 NOTE — Assessment & Plan Note (Signed)
Sounds like an optical migraine (mild). I reviewed signs/symptoms of stroke with her and advised her to go to the ER if she has these symptoms. Let me know if she has recurrent symptoms as she described.

## 2016-01-03 ENCOUNTER — Encounter: Payer: Self-pay | Admitting: Family

## 2016-01-03 ENCOUNTER — Ambulatory Visit (INDEPENDENT_AMBULATORY_CARE_PROVIDER_SITE_OTHER): Payer: BLUE CROSS/BLUE SHIELD | Admitting: Family

## 2016-01-03 VITALS — BP 125/74 | HR 78 | Temp 98.5°F | Resp 16 | Ht 68.0 in | Wt 123.8 lb

## 2016-01-03 DIAGNOSIS — I10 Essential (primary) hypertension: Secondary | ICD-10-CM | POA: Diagnosis not present

## 2016-01-03 DIAGNOSIS — R634 Abnormal weight loss: Secondary | ICD-10-CM

## 2016-01-03 LAB — BASIC METABOLIC PANEL
BUN: 22 mg/dL (ref 6–23)
CO2: 30 mEq/L (ref 19–32)
Calcium: 9.4 mg/dL (ref 8.4–10.5)
Chloride: 101 mEq/L (ref 96–112)
Creatinine, Ser: 0.84 mg/dL (ref 0.40–1.20)
GFR: 72.86 mL/min (ref >60.00)
GLUCOSE: 100 mg/dL — AB (ref 70–99)
POTASSIUM: 4 meq/L (ref 3.5–5.1)
SODIUM: 139 meq/L (ref 135–145)

## 2016-01-03 NOTE — Patient Instructions (Addendum)
Please eat healthy high calories snacks such as nuts, nut butters, dried fruits and keep an eye on your weight. Let me know if you have any further weight loss.  Continue current blood pressure medications.  Complete lab work prior to leaving.

## 2016-01-03 NOTE — Progress Notes (Signed)
Subjective:    Patient ID: Megan Middleton, female    DOB: May 19, 1955, 60 y.o.   MRN: [id number]  HPI  Megan Middleton is a 60 yr old female who presents today for follow up of her blood pressure. Last visit we added amlodipine 2.5mg  daily to her regimen. She denies CP/SOB or swelling. Reports good compliance with blood pressure medications. Denies visual changes.   BP Readings from Last 3 Encounters:  01/03/16 125/74  12/06/15 (!) 142/70  06/25/15 130/78    Weight loss- reports that she was recently diagnosed with strep throat and is on abx for this.  Weighs every day the gym.   Wt Readings from Last 3 Encounters:  01/03/16 123 lb 12.8 oz (56.2 kg)  12/06/15 125 lb 6.4 oz (56.9 kg)  06/25/15 126 lb (57.2 kg)      Review of Systems See HPI  Past Medical History:  Diagnosis Date  . Gilberts syndrome   . Gynecological examination    sees Dr. Paula Middleton   . Hyperlipidemia   . Hypertension   . Squamous cell carcinoma    Sees Derm Megan Middleton     Social History   Social History  . Marital status: Married    Spouse name: N/A  . Number of children: N/A  . Years of education: N/A   Occupational History  . Not on file.   Social History Main Topics  . Smoking status: Never Smoker  . Smokeless tobacco: Never Used  . Alcohol use 0.0 oz/week     Comment: occ  . Drug use: No  . Sexual activity: Not on file   Other Topics Concern  . 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 COLONOSCOPY  10-08-13   per Dr. Fuller Middleton, adenomatous polyps, repear in 5 yrs  . TUBAL LIGATION      Family History  Problem Relation Age of Onset  . Hypertension Father   . Breast cancer Sister   . Lymphoma Mother     . Leukemia Mother   . Colon cancer Neg Hx   . Esophageal cancer Neg Hx   . Rectal cancer Neg Hx   . Stomach cancer Neg Hx     No Known Allergies  Current Outpatient Prescriptions on File Prior to Visit  Medication Sig Dispense Refill  . amLODipine (NORVASC) 2.5 MG tablet Take 1 tablet (2.5 mg total) by mouth daily. 30 tablet 3  . calcium carbonate (OS-CAL) 600 MG TABS Take 600 mg by mouth daily. With vitamin d    . fish oil-omega-3 fatty acids 1000 MG capsule Take 2 g by mouth daily. Triple strength omega complex    . Lactobacillus Rhamnosus, GG, (CULTURELLE PO) Take 1 tablet by mouth daily.    . Multiple Vitamin (MULTIVITAMIN) tablet Take 1 tablet by mouth daily.      Marland Kitchen spironolactone (ALDACTONE) 25 MG tablet TAKE 1 TABLET (25 MG TOTAL) BY MOUTH DAILY. 30 tablet 5   No current facility-administered medications on file prior to visit.     BP 125/74 (BP Location: Right Arm, Cuff Size: Normal)   Pulse 78   Temp 98.5 F (36.9 C) (Oral)   Resp 16  Ht 5\' 8"  (1.727 m)   Wt 123 lb 12.8 oz (56.2 kg)   SpO2 100%   BMI 18.82 kg/m       Objective:   Physical Exam  Constitutional: She is oriented to person, place, and time. She appears well-developed and well-nourished.  HENT:  Head: Normocephalic and atraumatic.  Cardiovascular: Normal rate, regular rhythm and normal heart sounds.   No murmur heard. Pulmonary/Chest: Effort normal and breath sounds normal. No respiratory distress. She has no wheezes.  Musculoskeletal: She exhibits no edema.  Neurological: She is alert and oriented to person, place, and time.  Psychiatric: She has a normal mood and affect. Her behavior is normal. Judgment and thought content normal.          Assessment & Middleton:   Weight loss- Advised pt to monitor her weight closely and call if she develops further weight loss. We also discussed adding high calorie healthy snacks such as dried fruits/nuts.

## 2016-01-03 NOTE — Assessment & Plan Note (Signed)
BP is well controlled on current regimen. Obtain follow up bmet.

## 2016-01-03 NOTE — Progress Notes (Signed)
Pre visit review using our clinic review tool, if applicable. No additional management support is needed unless otherwise documented below in the visit note. 

## 2016-01-05 ENCOUNTER — Ambulatory Visit: Payer: BLUE CROSS/BLUE SHIELD | Admitting: Family

## 2016-01-25 ENCOUNTER — Other Ambulatory Visit: Payer: Self-pay | Admitting: Family

## 2016-01-25 NOTE — Telephone Encounter (Signed)
Refill sent per [facility] refill protocol/SLS  

## 2016-01-31 ENCOUNTER — Other Ambulatory Visit: Payer: Self-pay | Admitting: Obstetrics and Gynecology

## 2016-01-31 DIAGNOSIS — Z1231 Encounter for screening mammogram for malignant neoplasm of breast: Secondary | ICD-10-CM

## 2016-02-08 ENCOUNTER — Telehealth: Payer: Self-pay | Admitting: Family

## 2016-02-08 NOTE — Telephone Encounter (Signed)
Caller name: Relation to [id number] Call back number: [phone] Pharmacy: [id number] - [place], [place] [phone] (Phone) [phone] (Fax)       Reason for call:  Patient states grandchild has been dx with the flu advised to contact PCP and request Tamiflu, please advise

## 2016-02-09 ENCOUNTER — Other Ambulatory Visit: Payer: Self-pay | Admitting: Internal Medicine

## 2016-02-09 DIAGNOSIS — J09X2 Influenza due to identified novel influenza A virus with other respiratory manifestations: Secondary | ICD-10-CM

## 2016-02-09 MED ORDER — OSELTAMIVIR PHOSPHATE 75 MG PO CAPS: 75.0000 mg | ORAL_CAPSULE | Freq: Two times a day (BID) | ORAL | 0 refills | Status: DC

## 2016-02-10 NOTE — Telephone Encounter (Signed)
Chart reviewed- tamiflu was sent by oncall physician.

## 2016-02-15 ENCOUNTER — Telehealth: Payer: Self-pay | Admitting: Family

## 2016-02-15 NOTE — Telephone Encounter (Signed)
Patient called stating that she will be volunteering at [facility] soon. She is requesting a copy of her immunization records for this. Please advise  Phone: [phone]

## 2016-02-15 NOTE — Telephone Encounter (Signed)
Spoke with pt and advised her to see if she could print immunizations off of mychart. If she is unable to do that she will call us back. Also thinks she may need a tb skin test and I advised her we could schedule her a nurse visit to complete that. She will call us back with any further needs.

## 2016-03-20 ENCOUNTER — Ambulatory Visit
Admission: RE | Admit: 2016-03-20 | Discharge: 2016-03-20 | Disposition: A | Discharge status: Home / Self Care | Payer: BLUE CROSS/BLUE SHIELD | Source: Ambulatory Visit | Attending: Obstetrics and Gynecology | Admitting: Obstetrics and Gynecology

## 2016-03-20 DIAGNOSIS — Z1231 Encounter for screening mammogram for malignant neoplasm of breast: Secondary | ICD-10-CM

## 2016-03-27 ENCOUNTER — Other Ambulatory Visit: Payer: Self-pay | Admitting: Family

## 2016-04-03 ENCOUNTER — Ambulatory Visit (INDEPENDENT_AMBULATORY_CARE_PROVIDER_SITE_OTHER): Payer: BLUE CROSS/BLUE SHIELD | Admitting: Family

## 2016-04-03 ENCOUNTER — Encounter: Payer: Self-pay | Admitting: Family

## 2016-04-03 VITALS — BP 137/66 | HR 79 | Temp 98.5°F | Resp 16 | Ht 68.0 in | Wt 125.6 lb

## 2016-04-03 DIAGNOSIS — I10 Essential (primary) hypertension: Secondary | ICD-10-CM | POA: Diagnosis not present

## 2016-04-03 DIAGNOSIS — R739 Hyperglycemia, unspecified: Secondary | ICD-10-CM

## 2016-04-03 LAB — BASIC METABOLIC PANEL
BUN: 19 mg/dL (ref 6–23)
CHLORIDE: 99 meq/L (ref 96–112)
CO2: 30 meq/L (ref 19–32)
CREATININE: 0.85 mg/dL (ref 0.40–1.20)
Calcium: 9.8 mg/dL (ref 8.4–10.5)
GFR: 71.82 mL/min (ref >60.00)
GLUCOSE: 84 mg/dL (ref 70–99)
Potassium: 3.6 mEq/L (ref 3.5–5.1)
Sodium: 139 mEq/L (ref 135–145)

## 2016-04-03 NOTE — Assessment & Plan Note (Signed)
BP stable on current medications. Continue same. Obtain follow up bmet.

## 2016-04-03 NOTE — Patient Instructions (Signed)
Please complete lab work prior to leaving.   

## 2016-04-03 NOTE — Progress Notes (Signed)
Pre visit review using our clinic review tool, if applicable. No additional management support is needed unless otherwise documented below in the visit note. 

## 2016-04-03 NOTE — Progress Notes (Signed)
Subjective:    Patient ID: Megan Middleton, female    DOB: 28-Feb-1955, 61 y.o.   MRN: [id number]  HPI  Megan Middleton is a 61 yr old female who presents today for follow up.  HTN-  Currently maintained on amlodipine and aldactone. Denies cp/sob/swelling.  BP Readings from Last 3 Encounters:  04/03/16 137/66  01/03/16 125/74  12/06/15 (!) 142/70   She was treated with zpak and prednisone for sinusitus. Reports that this is improved.    Wt Readings from Last 3 Encounters:  04/03/16 125 lb 9.6 oz (57 kg)  01/03/16 123 lb 12.8 oz (56.2 kg)  12/06/15 125 lb 6.4 oz (56.9 kg)    Review of Systems See HPI  Past Medical History:  Diagnosis Date  . Gilberts syndrome   . Gynecological examination    sees Dr. [name]   . Hyperlipidemia   . Hypertension   . Squamous cell carcinoma    Sees Derm [name]     Social History   Social History  . Marital status: Widowed    Spouse name: N/A  . Number of children: N/A  . Years of education: N/A   Occupational History  . Not on file.   Social History Main Topics  . Smoking status: Never Smoker  . Smokeless tobacco: Never Used  . Alcohol use 0.0 oz/week     Comment: occ  . Drug use: No  . Sexual activity: Not on file   Other Topics Concern  . 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 COLONOSCOPY  10-08-13   per Dr. Fuller Plan, adenomatous polyps, repear in 5 yrs  . TUBAL LIGATION      Family History  Problem Relation Age of Onset  . Hypertension Father   . Breast cancer Sister   . Lymphoma Mother   . Leukemia Mother   . Colon cancer Neg Hx   . Esophageal cancer Neg Hx   . Rectal cancer Neg Hx   . Stomach cancer Neg Hx     No Known  Allergies  Current Outpatient Prescriptions on File Prior to Visit  Medication Sig Dispense Refill  . amLODipine (NORVASC) 2.5 MG tablet TAKE 1 TABLET (2.5 MG TOTAL) BY MOUTH DAILY. 30 tablet 3  . calcium carbonate (OS-CAL) 600 MG TABS Take 600 mg by mouth daily. With vitamin d    . fish oil-omega-3 fatty acids 1000 MG capsule Take 2 g by mouth daily. Triple strength omega complex    . Lactobacillus Rhamnosus, GG, (CULTURELLE PO) Take 1 tablet by mouth daily.    . Multiple Vitamin (MULTIVITAMIN) tablet Take 1 tablet by mouth daily.      Marland Kitchen spironolactone (ALDACTONE) 25 MG tablet TAKE 1 TABLET (25 MG TOTAL) BY MOUTH DAILY. 30 tablet 5   No current facility-administered medications on file prior to visit.     BP 137/66 (BP Location: Right Arm, Cuff Size: Normal)   Pulse 79   Temp 98.5 F (36.9 C) (Oral)   Resp 16   Ht 5\' 8"  (1.727 m)   Wt 125 lb 9.6 oz (57 kg)   SpO2 100% Comment: room air  BMI 19.10 kg/m  Objective:   Physical Exam  Constitutional: She is oriented to person, place, and time. She appears well-developed and well-nourished.  HENT:  Head: Normocephalic.  Left Ear: External ear normal.  Cardiovascular: Normal rate, regular rhythm and normal heart sounds.   No murmur heard. Pulmonary/Chest: Effort normal and breath sounds normal. No respiratory distress. She has no wheezes.  Neurological: She is alert and oriented to person, place, and time.  Psychiatric: She has a normal mood and affect. Her behavior is normal. Judgment and thought content normal.          Assessment & Plan:  Hyperglycemia- mild. Obtain A1C.

## 2016-04-04 LAB — HEMOGLOBIN A1C
Hgb A1c MFr Bld: 5.2 % (ref <5.7)
Mean Plasma Glucose: 103 mg/dL

## 2016-07-04 ENCOUNTER — Encounter: Payer: BLUE CROSS/BLUE SHIELD | Admitting: Family

## 2016-07-24 ENCOUNTER — Other Ambulatory Visit: Payer: Self-pay | Admitting: Family

## 2016-08-02 ENCOUNTER — Encounter: Payer: Self-pay | Admitting: Family

## 2016-08-02 ENCOUNTER — Ambulatory Visit (HOSPITAL_BASED_OUTPATIENT_CLINIC_OR_DEPARTMENT_OTHER)
Admission: RE | Admit: 2016-08-02 | Discharge: 2016-08-02 | Disposition: A | Discharge status: Home / Self Care | Payer: BLUE CROSS/BLUE SHIELD | Source: Ambulatory Visit | Attending: Family | Admitting: Family

## 2016-08-02 ENCOUNTER — Ambulatory Visit (INDEPENDENT_AMBULATORY_CARE_PROVIDER_SITE_OTHER): Payer: BLUE CROSS/BLUE SHIELD | Admitting: Family

## 2016-08-02 VITALS — BP 131/78 | HR 85 | Temp 98.3°F | Resp 16 | Ht 67.0 in | Wt 126.2 lb

## 2016-08-02 DIAGNOSIS — Z0000 Encounter for general adult medical examination without abnormal findings: Secondary | ICD-10-CM | POA: Insufficient documentation

## 2016-08-02 DIAGNOSIS — M8580 Other specified disorders of bone density and structure, unspecified site: Secondary | ICD-10-CM | POA: Insufficient documentation

## 2016-08-02 DIAGNOSIS — E2839 Other primary ovarian failure: Secondary | ICD-10-CM

## 2016-08-02 LAB — TSH: TSH: 1.1 u[IU]/mL (ref 0.35–4.50)

## 2016-08-02 LAB — CBC WITH DIFFERENTIAL/PLATELET
Basophils Absolute: 0.1 10*3/uL (ref 0.0–0.1)
Basophils Relative: 1.1 % (ref 0.0–3.0)
Eosinophils Absolute: 0.1 10*3/uL (ref 0.0–0.7)
Eosinophils Relative: 2.1 % (ref 0.0–5.0)
HCT: 41.7 % (ref 36.0–46.0)
Hemoglobin: 14.4 g/dL (ref 12.0–15.0)
Lymphocytes Relative: 30.8 % (ref 12.0–46.0)
Lymphs Abs: 1.6 10*3/uL (ref 0.7–4.0)
MCHC: 34.6 g/dL (ref 30.0–36.0)
MCV: 92.7 fl (ref 78.0–100.0)
Monocytes Absolute: 0.3 10*3/uL (ref 0.1–1.0)
Monocytes Relative: 4.9 % (ref 3.0–12.0)
Neutro Abs: 3.2 10*3/uL (ref 1.4–7.7)
Neutrophils Relative %: 61.1 % (ref 43.0–77.0)
Platelets: 245 10*3/uL (ref 150.0–400.0)
RBC: 4.5 Mil/uL (ref 3.87–5.11)
RDW: 12.7 % (ref 11.5–15.5)
WBC: 5.3 10*3/uL (ref 4.0–10.5)

## 2016-08-02 LAB — URINALYSIS, ROUTINE W REFLEX MICROSCOPIC
BILIRUBIN URINE: NEGATIVE
HGB URINE DIPSTICK: NEGATIVE
KETONES UR: NEGATIVE
LEUKOCYTES UA: NEGATIVE
NITRITE: NEGATIVE
RBC / HPF: NONE SEEN (ref 0–?)
Specific Gravity, Urine: <=1.005 — AB (ref 1.000–1.030)
TOTAL PROTEIN, URINE-UPE24: NEGATIVE
URINE GLUCOSE: NEGATIVE
UROBILINOGEN UA: 0.2 (ref 0.0–1.0)
pH: 7 (ref 5.0–8.0)

## 2016-08-02 LAB — HEPATIC FUNCTION PANEL
ALT: 14 U/L (ref 0–35)
AST: 16 U/L (ref 0–37)
Albumin: 4.7 g/dL (ref 3.5–5.2)
Alkaline Phosphatase: 76 U/L (ref 39–117)
Bilirubin, Direct: 0.4 mg/dL — ABNORMAL HIGH (ref 0.0–0.3)
Total Bilirubin: 2.8 mg/dL — ABNORMAL HIGH (ref 0.2–1.2)
Total Protein: 7 g/dL (ref 6.0–8.3)

## 2016-08-02 LAB — LIPID PANEL
CHOLESTEROL: 187 mg/dL (ref 0–200)
HDL: 78.4 mg/dL (ref >39.00)
LDL Cholesterol: 89 mg/dL (ref 0–99)
NonHDL: 108.51
TRIGLYCERIDES: 99 mg/dL (ref 0.0–149.0)
Total CHOL/HDL Ratio: 2
VLDL: 19.8 mg/dL (ref 0.0–40.0)

## 2016-08-02 LAB — BASIC METABOLIC PANEL
BUN: 17 mg/dL (ref 6–23)
CHLORIDE: 101 meq/L (ref 96–112)
CO2: 30 mEq/L (ref 19–32)
Calcium: 9.9 mg/dL (ref 8.4–10.5)
Creatinine, Ser: 0.82 mg/dL (ref 0.40–1.20)
GFR: 74.78 mL/min (ref >60.00)
Glucose, Bld: 100 mg/dL — ABNORMAL HIGH (ref 70–99)
Potassium: 3.7 mEq/L (ref 3.5–5.1)
Sodium: 140 mEq/L (ref 135–145)

## 2016-08-02 NOTE — Patient Instructions (Addendum)
Please complete lab work prior to leaving. Continue healthy diet, regular exercise.

## 2016-08-02 NOTE — Progress Notes (Addendum)
Subjective:    Patient ID: Megan Midd[place], female    DOB: 02-Oct-1955, 61 y.o.   MRN: [id number]  HPI  Patient presents today for complete physical.  Immunizations: tetanus 9/12 Diet: healthy Exercise: gym 4-5 times a week Colonoscopy: 9/15- due for 5 year follow up Dexa:  >6 years ago.  Pap Smear:  2016, saw GYN in February Mammogram: 03/21/16  Wt Readings from Last 3 Encounters:  08/02/16 126 lb 3.2 oz (57.2 kg)  04/03/16 125 lb 9.6 oz (57 kg)  01/03/16 123 lb 12.8 oz (56.2 kg)      Review of Systems  Constitutional: Negative for unexpected weight change.  HENT: Negative for hearing loss and rhinorrhea.   Eyes: Negative for visual disturbance.  Respiratory: Negative for cough.   Cardiovascular: Negative for leg swelling.  Gastrointestinal: Negative for constipation and diarrhea.  Genitourinary: Negative for dysuria and frequency.  Musculoskeletal: Negative for arthralgias and myalgias.  Skin: Negative for rash.  Hematological: Negative for adenopathy.  Psychiatric/Behavioral:       Denies depression/anxiety   Past Medical History:  Diagnosis Date  . Gilberts syndrome   . Gynecological examination    sees Dr. Paula Midd[place]   . Hyperlipidemia   . Hypertension   . Squamous cell carcinoma    Sees Derm [place]     Social History   Social History  . Marital status: Widowed    Spouse name: N/A  . Number of children: N/A  . Years of education: N/A   Occupational History  . Not on file.   Social History Main Topics  . Smoking status: Never Smoker  . Smokeless tobacco: Never Used  . Alcohol use 0.0 oz/week     Comment: occ / social  . Drug use: No  . Sexual activity: Not on file   Other Topics Concern  . 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 COLONOSCOPY  10-08-13   per Dr. Fuller Plan, adenomatous polyps, repear in 5 yrs  . TUBAL LIGATION      Family History  Problem Relation Age of Onset  . Hypertension Father   . Breast cancer Sister   . Lymphoma Mother   . Leukemia Mother   . Colon cancer Neg Hx   . Esophageal cancer Neg Hx   . Rectal cancer Neg Hx   . Stomach cancer Neg Hx     No Known Allergies  Current Outpatient Prescriptions on File Prior to Visit  Medication Sig Dispense Refill  . amLODipine (NORVASC) 2.5 MG tablet TAKE 1 TABLET (2.5 MG TOTAL) BY MOUTH DAILY. 30 tablet 5  . calcium carbonate (OS-CAL) 600 MG TABS Take 600 mg by mouth daily. With vitamin d    . fish oil-omega-3 fatty acids 1000 MG capsule Take 2 g by mouth daily. Triple strength omega complex    . Lactobacillus Rhamnosus, GG, (CULTURELLE PO) Take 1 tablet by mouth daily.    . Multiple Vitamin (MULTIVITAMIN) tablet Take 1 tablet by mouth daily.      Marland Kitchen spironolactone (ALDACTONE) 25 MG tablet TAKE 1 TABLET (25 MG TOTAL) BY MOUTH DAILY. 30 tablet 5   No current facility-administered medications on file prior to visit.  BP 131/78 (BP Location: Right Arm, Cuff Size: Normal)   Pulse 85   Temp 98.3 F (36.8 C) (Oral)   Resp 16   Ht 5\' 7"  (1.702 m)   Wt 126 lb 3.2 oz (57.2 kg)   SpO2 99%   BMI 19.77 kg/m       Objective:   Physical Exam Physical Exam  Constitutional: She is oriented to person, place, and time. She appears well-developed and well-nourished. No distress.  HENT:  Head: Normocephalic and atraumatic.  Right Ear: Tympanic membrane and ear canal normal.  Left Ear: Tympanic membrane and ear canal normal.  Mouth/Throat: Oropharynx is clear and moist.  Eyes: Pupils are equal, round, and reactive to light. No scleral icterus.  Neck: Normal range of motion. No thyromegaly present.  Cardiovascular: Normal rate and regular rhythm.   No murmur  heard. Pulmonary/Chest: Effort normal and breath sounds normal. No respiratory distress. He has no wheezes. She has no rales. She exhibits no tenderness.  Abdominal: Soft. Bowel sounds are normal. She exhibits no distension and no mass. There is no tenderness. There is no rebound and no guarding.  Musculoskeletal: She exhibits no edema.  Lymphadenopathy:    She has no cervical adenopathy.  Neurological: She is alert and oriented to person, place, and time. She has normal patellar reflexes. She exhibits normal muscle tone. Coordination normal.  Skin: Skin is warm and dry.  Psychiatric: She has a normal mood and affect. Her behavior is normal. Judgment and thought content normal.  Breast/pelvic: deferred        Assessment & Plan:        Assessment & Plan:  Preventative care- Encourage patient to continue healthy diet and regular exercise. We also discussed that she is a candidate for the shingles vaccine.(shingrix). We are currently out of stock due to a Producer, television/film/video. I advised her to contact me in a few months to check back in if we have it in stock we can arrange a nurse visit. Her tetanus is up-to-date. Mammogram and Pap are up to date per GYN. Will obtain routine lab work. We'll also obtain screening DEXA scan.EKG tracing is personally reviewed.  EKG notes NSR.  No acute changes.

## 2016-08-21 ENCOUNTER — Telehealth: Payer: Self-pay | Admitting: Family

## 2016-08-21 NOTE — Telephone Encounter (Signed)
[place]-- please advise? 

## 2016-08-21 NOTE — Telephone Encounter (Signed)
Caller name:Amery Abair Relationship to patient: Can be [place]:  Reason for call:Patient had Dexa Scan done on 08/02/16 here at the [facility]. Dx was not coded as preventative, insurance is not paying. Please advise.

## 2016-08-23 NOTE — Telephone Encounter (Signed)
Can you pls speak with [place] and see if we can resubmit as preventative care? Might need to speak with [facility] as well. Thanks.

## 2016-08-23 NOTE — Telephone Encounter (Signed)
Advised pt of status.

## 2016-08-23 NOTE — Telephone Encounter (Signed)
Request has been submitted to Glass blower/designer.

## 2016-10-16 ENCOUNTER — Telehealth: Payer: Self-pay | Admitting: Family

## 2016-10-16 NOTE — Telephone Encounter (Signed)
[place] from Patient billing called in because she said that pt came in on 08/02/16 and had a bone density. She said that it was billing in hospital billing instead of professional billing. She said that visit need to be re-coded so that it is covered by insurance.   She would like for [place] to follow up with pt once assist.

## 2016-10-18 NOTE — Telephone Encounter (Signed)
Pt called in to follow up. Advised that office manager is not in the office today but would definitely assist when she is back.   Pt would like to have a call back whenever for update.

## 2016-10-30 ENCOUNTER — Ambulatory Visit (INDEPENDENT_AMBULATORY_CARE_PROVIDER_SITE_OTHER): Payer: BLUE CROSS/BLUE SHIELD | Admitting: Family

## 2016-10-30 ENCOUNTER — Encounter: Payer: Self-pay | Admitting: Family

## 2016-10-30 VITALS — BP 121/61 | HR 87 | Temp 98.4°F | Resp 16 | Ht 67.0 in | Wt 124.4 lb

## 2016-10-30 DIAGNOSIS — Z23 Encounter for immunization: Secondary | ICD-10-CM

## 2016-10-30 DIAGNOSIS — R197 Diarrhea, unspecified: Secondary | ICD-10-CM | POA: Diagnosis not present

## 2016-10-30 NOTE — Progress Notes (Signed)
Subjective:    Patient ID: [place], female    DOB: 16-Nov-1955, 61 y.o.   MRN: [id number]  HPI  [name] is a 61 yr old female who presents today with chief complaint of diarrhea.  She reports diarrhea in AM x 1 week.  She does have a history of IBS. Only occurs in the AM.  Reports that she had balls of stool after liquid stool.  She has not traveled anywhere.  Denies recent antibiotic use.  Last BM was 2-3 hours ago and was completely formed. However this stool was preceded by watery stool.   Denies abdominal pain, fever, blood in stool, or nausea/vomitting.  Reports stool is foul smelling.  Uses imodium prn which helps.    Review of Systems See HPI  Past Medical History:  Diagnosis Date  . Gilberts syndrome   . Gynecological examination    sees Dr. [name]   . Hyperlipidemia   . Hypertension   . Squamous cell carcinoma    Sees Derm [name]     Social History   Social History  . Marital status: Widowed    Spouse name: N/A  . Number of children: N/A  . Years of education: N/A   Occupational History  . Not on file.   Social History Main Topics  . Smoking status: Never Smoker  . Smokeless tobacco: Never Used  . Alcohol use 0.0 oz/week     Comment: occ / social  . Drug use: No  . Sexual activity: Not on file   Other Topics Concern  . 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 COLONOSCOPY  10-08-13   per Dr. Fuller Plan, adenomatous polyps, repear in 5 yrs  . TUBAL LIGATION      Family History  Problem Relation Age of Onset  . Hypertension Father   . Breast cancer Sister   . Lymphoma Mother   . Leukemia Mother   . Colon cancer Neg Hx   . Esophageal cancer Neg Hx   . Rectal  cancer Neg Hx   . Stomach cancer Neg Hx     No Known Allergies  Current Outpatient Prescriptions on File Prior to Visit  Medication Sig Dispense Refill  . amLODipine (NORVASC) 2.5 MG tablet TAKE 1 TABLET (2.5 MG TOTAL) BY MOUTH DAILY. 30 tablet 5  . calcium carbonate (OS-CAL) 600 MG TABS Take 600 mg by mouth daily. With vitamin d    . fish oil-omega-3 fatty acids 1000 MG capsule Take 2 g by mouth daily. Triple strength omega complex    . Lactobacillus Rhamnosus, GG, (CULTURELLE PO) Take 1 tablet by mouth daily.    . Multiple Vitamin (MULTIVITAMIN) tablet Take 1 tablet by mouth daily.      Marland Kitchen spironolactone (ALDACTONE) 25 MG tablet TAKE 1 TABLET (25 MG TOTAL) BY MOUTH DAILY. 30 tablet 5   No current facility-administered medications on file prior to visit.     BP 121/61 (BP Location: Right Arm, Cuff Size: Normal)   Pulse 87   Temp 98.4 F (36.9 C) (Oral)   Resp 16   Ht 5\' 7"  (1.702 m)   Wt 124 lb 6.4 oz (56.4 kg)  SpO2 98%   BMI 19.48 kg/m       Objective:   Physical Exam  Constitutional: She is oriented to person, place, and time. She appears well-developed and well-nourished.  HENT:  Head: Normocephalic and atraumatic.  Cardiovascular: Normal rate, regular rhythm and normal heart sounds.   No murmur heard. Pulmonary/Chest: Effort normal and breath sounds normal. No respiratory distress. She has no wheezes.  Abdominal: Soft. Bowel sounds are normal. She exhibits no distension. There is no tenderness. There is no rebound and no guarding.  Musculoskeletal: She exhibits no edema.  Neurological: She is alert and oriented to person, place, and time.  Skin: Skin is warm and dry.  Psychiatric: She has a normal mood and affect. Her behavior is normal. Judgment and thought content normal.          Assessment & Plan:  Diarrhea-  I suspect that this is due to IBS. However will evaluate with c diff PCR, O and P and culture. Pt is advised as follows:   You may continue imodium  as needed. If black/bloody stools or if you develop severe abdominal pain. Drink plenty of fluids. Call if not improved in 1 week, sooner if diarrhea worsens.

## 2016-10-30 NOTE — Addendum Note (Signed)
Addended by: Kelle Darting A on: 10/30/2016 07:38 PM   Modules accepted: Orders

## 2016-10-30 NOTE — Patient Instructions (Signed)
Complete stool samples and return to lab at your earliest convenience. You may continue imodium as needed. If black/bloody stools or if you develop severe abdominal pain. Drink plenty of fluids. Call if not improved in 1 week, sooner if diarrhea worsens.

## 2016-10-31 ENCOUNTER — Other Ambulatory Visit: Payer: BLUE CROSS/BLUE SHIELD

## 2016-11-01 LAB — OVA AND PARASITE EXAMINATION
CONCENTRATE RESULT: NONE SEEN
MICRO NUMBER:: 81123156
SPECIMEN QUALITY: ADEQUATE
TRICHROME RESULT: NONE SEEN

## 2016-11-01 LAB — CLOSTRIDIUM DIFFICILE BY PCR: CDIFFPCR: NOT DETECTED

## 2016-11-03 LAB — STOOL CULTURE
MICRO NUMBER: 81123152
MICRO NUMBER:: 81123150
MICRO NUMBER:: 81123151
SHIGA RESULT:: NOT DETECTED
SPECIMEN QUALITY: ADEQUATE
SPECIMEN QUALITY: ADEQUATE
SPECIMEN QUALITY:: ADEQUATE

## 2016-11-20 NOTE — Telephone Encounter (Signed)
Relation to pt: self  Call back number: [phone]    Reason for call:  Patient checking on the status of message below, please advise

## 2017-01-03 ENCOUNTER — Other Ambulatory Visit: Payer: Self-pay | Admitting: Family

## 2017-01-31 ENCOUNTER — Ambulatory Visit: Payer: BLUE CROSS/BLUE SHIELD | Admitting: Family

## 2017-02-05 ENCOUNTER — Ambulatory Visit: Payer: Self-pay | Admitting: Family

## 2017-02-07 ENCOUNTER — Other Ambulatory Visit: Payer: Self-pay | Admitting: Obstetrics and Gynecology

## 2017-02-07 DIAGNOSIS — Z1231 Encounter for screening mammogram for malignant neoplasm of breast: Secondary | ICD-10-CM

## 2017-02-19 ENCOUNTER — Telehealth: Payer: Self-pay | Admitting: Family

## 2017-02-19 MED ORDER — OSELTAMIVIR PHOSPHATE 75 MG PO CAPS: 75.0000 mg | ORAL_CAPSULE | Freq: Every day | ORAL | 0 refills | Status: AC

## 2017-02-19 NOTE — Telephone Encounter (Signed)
Left  Message  For  Patient to  Call back   For  More information and  Or  Any  Symptoms

## 2017-02-19 NOTE — Telephone Encounter (Signed)
Pt is experiencing congestion and headache.  Her grandson  Has been dx with the flu and she is with him daily.

## 2017-02-19 NOTE — Telephone Encounter (Signed)
Patient advised rx was sent in to her pharmacy and to call if new symptoms or fever >101.

## 2017-02-19 NOTE — Telephone Encounter (Signed)
Pt   Placed  On  3   Ways  Conversation    With    [place]  An  Office  Visit

## 2017-02-19 NOTE — Telephone Encounter (Signed)
Copied from [place] [id number]. Topic: Quick Communication - See Telephone Encounter >> Feb 19, 2017  9:24 AM Burnis Medin, NT wrote: CRM for notification. See Telephone encounter for: Patient is calling to see if the doctor can give her a prescription for Tam flu. Patient said she has been exposed to the flu. Pt uses [id number] [address], [place] [phone] (Phone) [phone] (Fax)    02/19/17.

## 2017-02-19 NOTE — Telephone Encounter (Signed)
Rx sent for tamiflu. Please advise pt to schedule office visit if fever >101, weakness, or if symptoms are not improved in 3-4 days.

## 2017-02-27 ENCOUNTER — Ambulatory Visit: Payer: Self-pay | Admitting: Family

## 2017-04-05 ENCOUNTER — Encounter: Payer: Self-pay | Admitting: Family Medicine

## 2017-04-05 ENCOUNTER — Ambulatory Visit (INDEPENDENT_AMBULATORY_CARE_PROVIDER_SITE_OTHER): Payer: BLUE CROSS/BLUE SHIELD | Admitting: Family Medicine

## 2017-04-05 VITALS — BP 120/80 | HR 113 | Temp 100.1°F | Ht 67.5 in | Wt 130.1 lb

## 2017-04-05 DIAGNOSIS — J0100 Acute maxillary sinusitis, unspecified: Secondary | ICD-10-CM

## 2017-04-05 MED ORDER — DOXYCYCLINE HYCLATE 100 MG PO TABS: 100.0000 mg | ORAL_TABLET | Freq: Two times a day (BID) | ORAL | 0 refills | Status: DC

## 2017-04-05 MED ORDER — METHYLPREDNISOLONE ACETATE 80 MG/ML IJ SUSP
80.0000 mg | Freq: Once | INTRAMUSCULAR | Status: AC
Start: 2017-04-05 — End: 2017-04-05
  Administered 2017-04-05: 80 mg via INTRAMUSCULAR

## 2017-04-05 NOTE — Progress Notes (Signed)
Pre visit review using our clinic review tool, if applicable. No additional management support is needed unless otherwise documented below in the visit note. 

## 2017-04-05 NOTE — Progress Notes (Signed)
Chief Complaint  Patient presents with  . Cough    congestion  . Headache    [place] here for URI complaints.  Duration: 3 days  Associated symptoms: subjective fever, sinus congestion, sinus pain, rhinorrhea, sore throat, dental pain, and cough Denies: itchy watery eyes, ear pain, ear drainage, wheezing, shortness of breath and myalgia Treatment to date: Zyrtec Sick contacts: None known   ROS:  Const: +fevers HEENT: As noted in HPI Lungs: No SOB  Past Medical History:  Diagnosis Date  . Gilberts syndrome   . Gynecological examination    sees Dr. [name]   . Hyperlipidemia   . Hypertension   . Squamous cell carcinoma    Sees Derm [name]     BP 073/71 (BP Location: Left Arm, Patient Position: Sitting, Cuff Size: Normal)   Pulse (!) 113   Temp 100.1 F (37.8 C) (Oral)   Ht 5' 7.5" (1.715 m)   Wt 130 lb 2 oz (59 kg)   SpO2 96%   BMI 20.08 kg/m  General: Awake, alert, appears stated age HEENT: AT, [place], ears patent b/l and TM's neg, +TTP over R max sinus, nares patent w/o discharge, pharynx pink and without exudates, MMM Neck: No masses or asymmetry Heart: RRR Lungs: CTAB, no accessory muscle use Psych: Age appropriate judgment and insight, normal mood and affect  Acute maxillary sinusitis, recurrence not specified - Plan: methylPREDNISolone acetate (DEPO-MEDROL) injection 80 mg  Orders as above. Tx given hx of fever and temp today.  Continue to push fluids, practice good hand hygiene, cover mouth when coughing. F/u prn. If starting to experience increasing fevers, shaking, or shortness of breath, seek immediate care. Pt voiced understanding and agreement to the plan.  [place], DO 04/05/17 11:43 AM

## 2017-04-05 NOTE — Patient Instructions (Signed)
Continue to push fluids, practice good hand hygiene, and cover your mouth if you cough.  If you start having increasing fevers, shaking or shortness of breath, seek immediate care.  Ibuprofen 400-600 mg (2-3 over the counter strength tabs) every 6 hours as needed for pain.  OK to take Tylenol 1000 mg (2 extra strength tabs) or 975 mg (3 regular strength tabs) every 6 hours as needed.  Let us know if you need anything.  

## 2017-04-09 ENCOUNTER — Ambulatory Visit: Payer: BLUE CROSS/BLUE SHIELD | Admitting: Family

## 2017-04-09 ENCOUNTER — Ambulatory Visit: Payer: Self-pay | Admitting: *Deleted

## 2017-04-09 ENCOUNTER — Encounter: Payer: Self-pay | Admitting: Family

## 2017-04-09 ENCOUNTER — Ambulatory Visit (INDEPENDENT_AMBULATORY_CARE_PROVIDER_SITE_OTHER): Payer: BLUE CROSS/BLUE SHIELD | Admitting: Family

## 2017-04-09 VITALS — BP 143/72 | HR 88 | Temp 98.6°F | Resp 16 | Ht 67.5 in | Wt 128.8 lb

## 2017-04-09 DIAGNOSIS — J329 Chronic sinusitis, unspecified: Secondary | ICD-10-CM | POA: Diagnosis not present

## 2017-04-09 MED ORDER — AMOXICILLIN-POT CLAVULANATE 875-125 MG PO TABS: 1.0000 | ORAL_TABLET | Freq: Two times a day (BID) | ORAL | 0 refills | Status: DC

## 2017-04-09 MED ORDER — FLUTICASONE PROPIONATE 50 MCG/ACT NA SUSP: 2.0000 | Freq: Every day | NASAL | 6 refills | Status: DC

## 2017-04-09 MED ORDER — LORATADINE 10 MG PO TABS: 10.0000 mg | ORAL_TABLET | Freq: Every day | ORAL | 11 refills | Status: DC

## 2017-04-09 NOTE — Telephone Encounter (Signed)
Notified pt. She will take the 4pm appt and 6:20pm appt has been cancelled.

## 2017-04-09 NOTE — Progress Notes (Signed)
Subjective:    Patient ID: [place], female    DOB: [birth year], 62 y.o.   MRN: [id number]  HPI   Reports frequent sinus infections. Sometimes goes to walk-in clinics.    Was seen on 3/14 and given doxycycline. Reports that previous infection was about 5 weeks ago.  She was treated with prednisone and augmentin and symptoms resolved.  Reports chronic allergic rhinitis.  She reports + nasal congestion, maxillary facial pain.  She has used saline solution. Reports mucousy diarrhea.    Review of Systems See HPI  Past Medical History:  Diagnosis Date  . Gilberts syndrome   . Gynecological examination    sees Dr. [name]   . Hyperlipidemia   . Hypertension   . Squamous cell carcinoma    Sees Derm [name]     Social History   Socioeconomic History  . Marital status: Widowed    Spouse name: Not on file  . Number of children: Not on file  . Years of education: Not on file  . Highest education level: Not on file  Social Needs  . Financial resource strain: Not on file  . Food insecurity - worry: Not on file  . Food insecurity - inability: Not on file  . Transportation needs - medical: Not on file  . Transportation needs - non-medical: Not on file  Occupational History  . Not on file  Tobacco Use  . Smoking status: Never Smoker  . Smokeless tobacco: Never Used  Substance and Sexual Activity  . Alcohol use: Yes    Alcohol/week: 0.0 oz    Comment: occ / social  . Drug use: No  . Sexual activity: Not on file  Other Topics Concern  . 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 COLONOSCOPY  10-08-13   per Dr. Fuller Plan, adenomatous polyps, repear in 5 yrs  . TUBAL LIGATION      Family  History  Problem Relation Age of Onset  . Hypertension Father   . Breast cancer Sister   . Lymphoma Mother   . Leukemia Mother   . Colon cancer Neg Hx   . Esophageal cancer Neg Hx   . Rectal cancer Neg Hx   . Stomach cancer Neg Hx     No Known Allergies  Current Outpatient Medications on File Prior to Visit  Medication Sig Dispense Refill  . amLODipine (NORVASC) 2.5 MG tablet TAKE 1 TABLET (2.5 MG TOTAL) BY MOUTH DAILY. 30 tablet 5  . calcium carbonate (OS-CAL) 600 MG TABS Take 600 mg by mouth daily. With vitamin d    . fish oil-omega-3 fatty acids 1000 MG capsule Take 2 g by mouth daily. Triple strength omega complex    . Lactobacillus Rhamnosus, GG, (CULTURELLE PO) Take 1 tablet by mouth daily.    . Multiple Vitamin (MULTIVITAMIN) tablet Take 1 tablet by mouth daily.      Marland Kitchen spironolactone (ALDACTONE) 25 MG tablet TAKE 1 TABLET (25 MG TOTAL) BY MOUTH DAILY. 30 tablet 5  . doxycycline (VIBRA-TABS) 100 MG tablet Take 1 tablet (100 mg total) by mouth 2 (two) times daily for 7 days. (Patient not taking: Reported on 04/09/2017) 14 tablet 0  No current facility-administered medications on file prior to visit.     BP (!) 143/72 (BP Location: Left Arm, Cuff Size: Normal)   Pulse 88   Temp 98.6 F (37 C) (Oral)   Resp 16   Ht 5' 7.5" (1.715 m)   Wt 128 lb 12.8 oz (58.4 kg)   SpO2 99%   BMI 19.88 kg/m       Objective:   Physical Exam  Constitutional: She is oriented to person, place, and time. She appears well-developed and well-nourished.  HENT:  Head: Normocephalic and atraumatic.  Right Ear: Tympanic membrane and ear canal normal.  Left Ear: Tympanic membrane and ear canal normal.  Nose: Right sinus exhibits maxillary sinus tenderness. Right sinus exhibits no frontal sinus tenderness. Left sinus exhibits maxillary sinus tenderness. Left sinus exhibits no frontal sinus tenderness.  Mouth/Throat: No oropharyngeal exudate, posterior oropharyngeal edema or posterior  oropharyngeal erythema.  Cardiovascular: Normal rate, regular rhythm and normal heart sounds.  No murmur heard. Pulmonary/Chest: Effort normal and breath sounds normal. No respiratory distress. She has no wheezes.  Lymphadenopathy:    She has no cervical adenopathy.  Neurological: She is alert and oriented to person, place, and time.  Psychiatric: She has a normal mood and affect. Her behavior is normal. Judgment and thought content normal.          Assessment & Plan:  Recurrent sinusitis-Patient is advised as follows: Stop doxycycline, start augmentin. Start flonase 2 sprays each nostril once daily Call if new/worsening symptoms or if not improved in 3 days. You will be contacted about your referral to ENT.

## 2017-04-09 NOTE — Telephone Encounter (Signed)
  Reason for Disposition . Caller has NON-URGENT medication question about med that PCP prescribed and triager unable to answer question  Answer Assessment - Initial Assessment Questions 1. SYMPTOMS: "Do you have any symptoms?"       Pt was dx with sinus infection, and was given abx. She is not feeling better yet, and the abx is giving pt a stomach ache. She has appt to see pcp at 620, would like to talk to someone sooner and also see if she can possible get in sooner    2. SEVERITY: If symptoms are present, ask "Are they mild, moderate or severe?"     Patient is still having the sinus symptoms- the fever is gone and the cough is better- congestion still present.Patient wants to discuss changing the medication.  Protocols used: MEDICATION QUESTION CALL-A-AH

## 2017-04-09 NOTE — Telephone Encounter (Signed)
Melissa-- please see pt's concerns below. I have placed her in the 4pm slot as well but wanted to see if you had any suggestions before I contact pt.  Please advise?

## 2017-04-09 NOTE — Patient Instructions (Addendum)
Patient is advised as follows: Stop doxycycline, start augmentin. Start flonase 2 sprays each nostril once daily Call if new/worsening symptoms or if not improved in 3 days. You will be contacted about your referral to ENT.

## 2017-04-09 NOTE — Telephone Encounter (Signed)
Don't take any further abx until I see her this afternoon please.

## 2017-04-10 ENCOUNTER — Ambulatory Visit: Payer: Self-pay

## 2017-04-16 ENCOUNTER — Ambulatory Visit
Admission: RE | Admit: 2017-04-16 | Discharge: 2017-04-16 | Disposition: A | Discharge status: Home / Self Care | Payer: BLUE CROSS/BLUE SHIELD | Source: Ambulatory Visit | Attending: Obstetrics and Gynecology | Admitting: Obstetrics and Gynecology

## 2017-04-16 DIAGNOSIS — Z1231 Encounter for screening mammogram for malignant neoplasm of breast: Secondary | ICD-10-CM

## 2017-04-23 ENCOUNTER — Encounter: Payer: Self-pay | Admitting: Family

## 2017-04-23 ENCOUNTER — Ambulatory Visit (INDEPENDENT_AMBULATORY_CARE_PROVIDER_SITE_OTHER): Payer: BLUE CROSS/BLUE SHIELD | Admitting: Family

## 2017-04-23 VITALS — BP 128/86 | HR 91 | Temp 98.6°F | Resp 16 | Ht 67.0 in | Wt 129.0 lb

## 2017-04-23 DIAGNOSIS — I10 Essential (primary) hypertension: Secondary | ICD-10-CM

## 2017-04-23 DIAGNOSIS — J329 Chronic sinusitis, unspecified: Secondary | ICD-10-CM | POA: Diagnosis not present

## 2017-04-23 LAB — BASIC METABOLIC PANEL
BUN: 15 mg/dL (ref 6–23)
CHLORIDE: 100 meq/L (ref 96–112)
CO2: 30 meq/L (ref 19–32)
CREATININE: 0.8 mg/dL (ref 0.40–1.20)
Calcium: 9.4 mg/dL (ref 8.4–10.5)
GFR: 76.76 mL/min (ref >60.00)
GLUCOSE: 103 mg/dL — AB (ref 70–99)
POTASSIUM: 3.8 meq/L (ref 3.5–5.1)
Sodium: 139 mEq/L (ref 135–145)

## 2017-04-23 NOTE — Progress Notes (Signed)
Subjective:    Patient ID: Megan Middleton, female    DOB: 09-30-1955, 62 y.o.   MRN: [id number]  HPI  Patient is a 62 yr old female who presents today for follow up of her hypertension. She is maintained on amlodipine 2.5mg  once daily.   BP Readings from Last 3 Encounters:  04/23/17 128/86  04/09/17 (!) 143/72  04/05/17 120/80    Sinusitis- reports sinus pain/pressure improved. Continues to have some nasal drainage. Completed abx. Has an appointment with ENT on 4/17.     Review of Systems See HPI     Past Medical History:  Diagnosis Date  . Gilberts syndrome   . Gynecological examination    sees Dr. Paula Middleton   . Hyperlipidemia   . Hypertension   . Squamous cell carcinoma    Sees Derm Megan Middleton     Social History   Socioeconomic History  . Marital status: Widowed    Spouse name: Not on file  . Number of children: Not on file  . Years of education: Not on file  . Highest education level: Not on file  Occupational History  . Not on file  Social Needs  . Financial resource strain: Not on file  . Food insecurity:    Worry: Not on file    Inability: Not on file  . Transportation needs:    Medical: Not on file    Non-medical: Not on file  Tobacco Use  . Smoking status: Never Smoker  . Smokeless tobacco: Never Used  Substance and Sexual Activity  . Alcohol use: Yes    Alcohol/week: 0.0 oz    Comment: occ / social  . Drug use: No  . Sexual activity: Not on file  Lifestyle  . Physical activity:    Days per week: Not on file    Minutes per session: Not on file  . Stress: Not on file  Relationships  . Social connections:    Talks on phone: Not on file    Gets together: Not on file    Attends religious service: Not on file    Active member of club or organization: Not on file    Attends meetings of clubs or organizations: Not on file    Relationship status: Not on file  . Intimate partner violence:    Fear of current or ex partner: Not on  file    Emotionally abused: Not on file    Physically abused: Not on file    Forced sexual activity: Not on file  Other Topics Concern  . 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 COLONOSCOPY  10-08-13   per Dr. Fuller Middleton, adenomatous polyps, repear in 5 yrs  . TUBAL LIGATION      Family History  Problem Relation Age of Onset  . Hypertension Father   . Breast cancer Sister   . Lymphoma Mother   . Leukemia Mother   . Colon cancer Neg Hx   . Esophageal cancer Neg Hx   . Rectal cancer Neg Hx   . Stomach cancer Neg Hx     Allergies  Allergen Reactions  . Doxycycline     GI side effects  Current Outpatient Medications on File Prior to Visit  Medication Sig Dispense Refill  . amLODipine (NORVASC) 2.5 MG tablet TAKE 1 TABLET (2.5 MG TOTAL) BY MOUTH DAILY. 30 tablet 5  . calcium carbonate (OS-CAL) 600 MG TABS Take 600 mg by mouth daily. With vitamin d    . fish oil-omega-3 fatty acids 1000 MG capsule Take 2 g by mouth daily. Triple strength omega complex    . fluticasone (FLONASE) 50 MCG/ACT nasal spray Place 2 sprays into both nostrils daily. 16 g 6  . Lactobacillus Rhamnosus, GG, (CULTURELLE PO) Take 1 tablet by mouth daily.    Marland Kitchen loratadine (CLARITIN) 10 MG tablet Take 1 tablet (10 mg total) by mouth daily. 30 tablet 11  . Multiple Vitamin (MULTIVITAMIN) tablet Take 1 tablet by mouth daily.      Marland Kitchen spironolactone (ALDACTONE) 25 MG tablet TAKE 1 TABLET (25 MG TOTAL) BY MOUTH DAILY. 30 tablet 5   No current facility-administered medications on file prior to visit.     BP 128/86 (BP Location: Right Arm, Patient Position: Sitting, Cuff Size: Small)   Pulse 91   Temp 98.6 F (37 C) (Oral)   Resp 16   Ht 5\' 7"   (1.702 m)   Wt 129 lb (58.5 kg)   SpO2 100%   BMI 20.20 kg/m    Objective:   Physical Exam  Constitutional: She appears well-developed and well-nourished.  HENT:  Head: Normocephalic and atraumatic.  Right Ear: Tympanic membrane and ear canal normal.  Left Ear: Tympanic membrane and ear canal normal.  Mouth/Throat: No oropharyngeal exudate, posterior oropharyngeal edema or posterior oropharyngeal erythema.  Cardiovascular: Normal rate, regular rhythm and normal heart sounds.  No murmur heard. Pulmonary/Chest: Effort normal and breath sounds normal. No respiratory distress. She has no wheezes.  Psychiatric: She has a normal mood and affect. Her behavior is normal. Judgment and thought content normal.          Assessment & Middleton:  Sinusitis- resolved. Advised her to continue claritin, flonase for drainage and keep upcoming appointment with ENT.  HTN- bp stable, continue current meds. Obtain follow up bmet.

## 2017-04-23 NOTE — Patient Instructions (Signed)
Please complete lab work prior to leaving.   

## 2017-07-17 ENCOUNTER — Other Ambulatory Visit: Payer: Self-pay | Admitting: Family

## 2017-08-27 ENCOUNTER — Encounter: Payer: BLUE CROSS/BLUE SHIELD | Admitting: Family

## 2017-09-18 ENCOUNTER — Encounter: Payer: BLUE CROSS/BLUE SHIELD | Admitting: Family

## 2017-10-16 ENCOUNTER — Encounter: Payer: Self-pay | Admitting: Family

## 2017-10-16 ENCOUNTER — Ambulatory Visit (INDEPENDENT_AMBULATORY_CARE_PROVIDER_SITE_OTHER): Payer: BLUE CROSS/BLUE SHIELD | Admitting: Family

## 2017-10-16 DIAGNOSIS — Z23 Encounter for immunization: Secondary | ICD-10-CM | POA: Diagnosis not present

## 2017-10-16 DIAGNOSIS — Z0000 Encounter for general adult medical examination without abnormal findings: Secondary | ICD-10-CM | POA: Diagnosis not present

## 2017-10-16 LAB — CBC WITH DIFFERENTIAL/PLATELET
BASOS ABS: 0.1 10*3/uL (ref 0.0–0.1)
Basophils Relative: 1 % (ref 0.0–3.0)
Eosinophils Absolute: 0.2 10*3/uL (ref 0.0–0.7)
Eosinophils Relative: 2.2 % (ref 0.0–5.0)
HCT: 42.8 % (ref 36.0–46.0)
Hemoglobin: 14.7 g/dL (ref 12.0–15.0)
LYMPHS ABS: 1.8 10*3/uL (ref 0.7–4.0)
Lymphocytes Relative: 23.5 % (ref 12.0–46.0)
MCHC: 34.4 g/dL (ref 30.0–36.0)
MCV: 92.6 fl (ref 78.0–100.0)
Monocytes Absolute: 0.4 10*3/uL (ref 0.1–1.0)
Monocytes Relative: 5.2 % (ref 3.0–12.0)
NEUTROS ABS: 5.2 10*3/uL (ref 1.4–7.7)
Neutrophils Relative %: 68.1 % (ref 43.0–77.0)
PLATELETS: 241 10*3/uL (ref 150.0–400.0)
RBC: 4.62 Mil/uL (ref 3.87–5.11)
RDW: 13.1 % (ref 11.5–15.5)
WBC: 7.6 10*3/uL (ref 4.0–10.5)

## 2017-10-16 LAB — URINALYSIS, ROUTINE W REFLEX MICROSCOPIC
Bilirubin Urine: NEGATIVE
HGB URINE DIPSTICK: NEGATIVE
KETONES UR: NEGATIVE
Leukocytes, UA: NEGATIVE
NITRITE: NEGATIVE
RBC / HPF: NONE SEEN (ref 0–?)
SPECIFIC GRAVITY, URINE: 1.015 (ref 1.000–1.030)
Total Protein, Urine: NEGATIVE
URINE GLUCOSE: NEGATIVE
UROBILINOGEN UA: 0.2 (ref 0.0–1.0)
pH: 7.5 (ref 5.0–8.0)

## 2017-10-16 LAB — HEPATIC FUNCTION PANEL
ALT: 13 U/L (ref 0–35)
AST: 14 U/L (ref 0–37)
Albumin: 4.9 g/dL (ref 3.5–5.2)
Alkaline Phosphatase: 96 U/L (ref 39–117)
BILIRUBIN DIRECT: 0.3 mg/dL (ref 0.0–0.3)
BILIRUBIN TOTAL: 2.8 mg/dL — AB (ref 0.2–1.2)
Total Protein: 7.2 g/dL (ref 6.0–8.3)

## 2017-10-16 LAB — BASIC METABOLIC PANEL
BUN: 17 mg/dL (ref 6–23)
CALCIUM: 10.1 mg/dL (ref 8.4–10.5)
CO2: 31 meq/L (ref 19–32)
CREATININE: 0.79 mg/dL (ref 0.40–1.20)
Chloride: 100 mEq/L (ref 96–112)
GFR: 77.76 mL/min (ref >60.00)
Glucose, Bld: 90 mg/dL (ref 70–99)
Potassium: 4.1 mEq/L (ref 3.5–5.1)
Sodium: 140 mEq/L (ref 135–145)

## 2017-10-16 LAB — LIPID PANEL
CHOL/HDL RATIO: 3
Cholesterol: 209 mg/dL — ABNORMAL HIGH (ref 0–200)
HDL: 69.8 mg/dL (ref >39.00)
LDL CALC: 119 mg/dL — AB (ref 0–99)
NonHDL: 139.65
TRIGLYCERIDES: 103 mg/dL (ref 0.0–149.0)
VLDL: 20.6 mg/dL (ref 0.0–40.0)

## 2017-10-16 LAB — TSH: TSH: 1.45 u[IU]/mL (ref 0.35–4.50)

## 2017-10-16 NOTE — Patient Instructions (Addendum)
Continue healthy diet and regular exercise. Complete lab work prior to leaving. For podiatry you can try calling Dr. Jacqualyn Posey for your bunions- [phone].

## 2017-10-16 NOTE — Progress Notes (Signed)
Subjective:    Patient ID: Megan Middleton, female    DOB: [birth year], 62 y.o.   MRN: [id number]  HPI  Megan Middleton is a 62 yr old female who presents today for complete physical.  Immunizations: tdap up to date, has not had shingrix. Flu shot today.  Diet: healthy Exercise: exercises regularly Colonoscopy: 10/08/13, due 9/20 Dexa: 08/02/16 Pap Smear: done 01/26/14, sees Megan Middleton.   Repeated 07/19/17- [facility]IL[facility] Mammogram: due 04/17/18 Vision:  2018 Dental: up to date Wt Readings from Last 3 Encounters:  10/16/17 133 lb (60.3 kg)  04/23/17 129 lb (58.5 kg)  04/09/17 128 lb 12.8 oz (58.4 kg)     Review of Systems  Constitutional: Negative for unexpected weight change.  HENT: Positive for rhinorrhea.   Respiratory: Negative for shortness of breath.        Mild cough at night  Cardiovascular: Negative for chest pain and leg swelling.  Gastrointestinal: Negative for blood in stool, constipation, diarrhea and nausea.  Genitourinary: Negative for dysuria, frequency, hematuria and vaginal bleeding.  Musculoskeletal: Positive for myalgias. Negative for arthralgias.  Skin: Negative for rash.  Neurological: Negative for headaches.  Hematological: Negative for adenopathy.  Psychiatric/Behavioral:       Denies depression/anxiety   Past Medical History:  Diagnosis Date  . Gilberts syndrome   . Gynecological examination    sees Megan Middleton   . Hyperlipidemia   . Hypertension   . Squamous cell carcinoma    Sees Derm Megan Middleton     Social History   Socioeconomic History  . Marital status: Widowed    Spouse name: Not on file  . Number of children: Not on file  . Years of education: Not on file  . Highest education level: Not on file  Occupational History  . Not on file  Social Needs  . Financial resource strain: Not on file  . Food insecurity:    Worry: Not on file    Inability: Not on file  . Transportation needs:    Medical: Not on file    Non-medical: Not  on file  Tobacco Use  . Smoking status: Never Smoker  . Smokeless tobacco: Never Used  Substance and Sexual Activity  . Alcohol use: Yes    Alcohol/week: 0.0 standard drinks    Comment: occ / social  . Drug use: No  . Sexual activity: Not on file  Lifestyle  . Physical activity:    Days per week: Not on file    Minutes per session: Not on file  . Stress: Not on file  Relationships  . Social connections:    Talks on phone: Not on file    Gets together: Not on file    Attends religious service: Not on file    Active member of club or organization: Not on file    Attends meetings of clubs or organizations: Not on file    Relationship status: Not on file  . Intimate partner violence:    Fear of current or ex partner: Not on file    Emotionally abused: Not on file    Physically abused: Not on file    Forced sexual activity: Not on file  Other Topics Concern  . 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 COLONOSCOPY  10-08-13   per Megan Middleton, adenomatous polyps, repear in 5 yrs  . TUBAL LIGATION      Family History  Problem Relation Age of Onset  . Hypertension Father   . Breast cancer Sister   . Lymphoma Mother   . Leukemia Mother   . Colon cancer Neg Hx   . Esophageal cancer Neg Hx   . Rectal cancer Neg Hx   . Stomach cancer Neg Hx     Allergies  Allergen Reactions  . Megan Middleton     GI side effects    Current Outpatient Medications on File Prior to Visit  Medication Sig Dispense Refill  . amLODipine (NORVASC) 2.5 MG tablet TAKE 1 TABLET (2.5 MG TOTAL) BY MOUTH DAILY. 30 tablet 5  . calcium carbonate (OS-CAL) 600 MG TABS Take 600 mg by mouth daily. With vitamin d    . fish oil-omega-3 fatty  acids 1000 MG capsule Take 2 g by mouth daily. Triple strength omega complex    . fluticasone (FLONASE) 50 MCG/ACT nasal spray Place 2 sprays into both nostrils daily. 16 g 6  . Lactobacillus Rhamnosus, GG, (CULTURELLE PO) Take 1 tablet by mouth daily.    Marland Kitchen loratadine (CLARITIN) 10 MG tablet Take 1 tablet (10 mg total) by mouth daily. 30 tablet 11  . Multiple Vitamin (MULTIVITAMIN) tablet Take 1 tablet by mouth daily.      Marland Kitchen spironolactone (ALDACTONE) 25 MG tablet TAKE 1 TABLET (25 MG TOTAL) BY MOUTH DAILY. 30 tablet 5   No current facility-administered medications on file prior to visit.     BP 120/82 (BP Location: Left Arm, Patient Position: Sitting, Cuff Size: Small)   Pulse 84   Temp 98.4 F (36.9 C) (Oral)   Resp 16   Ht 5\' 7"  (1.702 m)   Wt 133 lb (60.3 kg)   SpO2 97%   BMI 20.83 kg/m        Objective:   Physical Exam   Physical Exam  Constitutional: She is oriented to person, place, and time. She appears well-developed and well-nourished. No distress.  HENT:  Head: Normocephalic and atraumatic.  Right Ear: Tympanic membrane and ear canal normal.  Left Ear: Tympanic membrane and ear canal normal.  Mouth/Throat: Oropharynx is clear and moist.  Eyes: Pupils are equal, round, and reactive to light. No scleral icterus.  Neck: Normal range of motion. No thyromegaly present.  Cardiovascular: Normal rate and regular rhythm.   No murmur heard. Pulmonary/Chest: Effort normal and breath sounds normal. No respiratory distress. He has no wheezes. She has no rales. She exhibits no tenderness.  Abdominal: Soft. Bowel sounds are normal. She exhibits no distension and no mass. There is no tenderness. There is no rebound and no guarding.  Musculoskeletal: She exhibits no edema.  Lymphadenopathy:    She has no cervical adenopathy.  Neurological: She is alert and oriented to person, place, and time. She has normal patellar reflexes. She exhibits normal muscle tone. Coordination normal.   Skin: Skin is warm and dry.  Psychiatric: She has a normal mood and affect. Her behavior is normal. Judgment and thought content normal.     Assessment & Middleton:    Preventative care- flu shot today, Shingrix #1 today.  Encouraged her to continue healthy diet, exercise.  Obtain routine lab work.  Pap/mammo/dexa/colo up to date. EKG tracing is personally reviewed.  EKG notes NSR.  No acute changes.  Assessment & Middleton:

## 2017-10-18 ENCOUNTER — Encounter: Payer: Self-pay | Admitting: Family

## 2017-12-10 ENCOUNTER — Other Ambulatory Visit: Payer: Self-pay | Admitting: Family

## 2017-12-18 ENCOUNTER — Ambulatory Visit: Payer: BLUE CROSS/BLUE SHIELD

## 2017-12-24 ENCOUNTER — Ambulatory Visit: Payer: BLUE CROSS/BLUE SHIELD

## 2017-12-24 ENCOUNTER — Ambulatory Visit (INDEPENDENT_AMBULATORY_CARE_PROVIDER_SITE_OTHER): Payer: BLUE CROSS/BLUE SHIELD

## 2017-12-24 DIAGNOSIS — Z23 Encounter for immunization: Secondary | ICD-10-CM

## 2018-03-05 ENCOUNTER — Other Ambulatory Visit: Payer: Self-pay | Admitting: Obstetrics and Gynecology

## 2018-03-05 DIAGNOSIS — Z1231 Encounter for screening mammogram for malignant neoplasm of breast: Secondary | ICD-10-CM

## 2018-04-19 ENCOUNTER — Ambulatory Visit: Payer: BLUE CROSS/BLUE SHIELD

## 2018-04-22 ENCOUNTER — Other Ambulatory Visit: Payer: Self-pay

## 2018-04-22 ENCOUNTER — Ambulatory Visit: Payer: BLUE CROSS/BLUE SHIELD | Admitting: Family

## 2018-05-27 ENCOUNTER — Ambulatory Visit: Payer: BLUE CROSS/BLUE SHIELD

## 2018-06-04 ENCOUNTER — Other Ambulatory Visit: Payer: Self-pay | Admitting: Family

## 2018-06-06 DIAGNOSIS — M79645 Pain in left finger(s): Secondary | ICD-10-CM | POA: Diagnosis not present

## 2018-06-07 ENCOUNTER — Ambulatory Visit
Admission: RE | Admit: 2018-06-07 | Discharge: 2018-06-07 | Disposition: A | Discharge status: Home / Self Care | Payer: Medicare Other | Source: Ambulatory Visit | Attending: Obstetrics and Gynecology | Admitting: Obstetrics and Gynecology

## 2018-06-07 ENCOUNTER — Other Ambulatory Visit: Payer: Self-pay

## 2018-06-07 DIAGNOSIS — N898 Other specified noninflammatory disorders of vagina: Secondary | ICD-10-CM | POA: Diagnosis not present

## 2018-06-07 DIAGNOSIS — Z1231 Encounter for screening mammogram for malignant neoplasm of breast: Secondary | ICD-10-CM

## 2018-06-10 ENCOUNTER — Telehealth (INDEPENDENT_AMBULATORY_CARE_PROVIDER_SITE_OTHER): Payer: Medicare Other | Admitting: Family

## 2018-06-10 ENCOUNTER — Other Ambulatory Visit: Payer: Self-pay

## 2018-06-10 DIAGNOSIS — R198 Other specified symptoms and signs involving the digestive system and abdomen: Secondary | ICD-10-CM | POA: Diagnosis not present

## 2018-06-10 DIAGNOSIS — M25512 Pain in left shoulder: Secondary | ICD-10-CM | POA: Diagnosis not present

## 2018-06-10 NOTE — Progress Notes (Signed)
Virtual Visit via Video Note  I connected with Megan Middleton on 06/10/18 at  8:20 AM EDT by a video enabled telemedicine application and verified that I am speaking with the correct person using two identifiers. This visit type was conducted due to national recommendations for restrictions regarding the COVID-19 Pandemic (e.g. social distancing).  This format is felt to be most appropriate for this patient at this time.   I discussed the limitations of evaluation and management by telemedicine and the availability of in person appointments. The patient expressed understanding and agreed to proceed.  Only the patient and myself were on today's video visit. The patient was at home and I was in my office at the time of today's visit.   History of Present Illness:  Patient is a 63 yr old female who presents today with chief complaint of brown discharge from the rectum. This occurred one day last week after walking.    Reports that she walks 2.5 miles twice a day.  Was unsure if it was coming from the vagina or the rectum. Has an appointment on 6/18, due for colonoscopy this year.  She called her GYN as well.  She reports that pelvic exam was normal.  Reports that since ten she has had no diarrhea or brownish discharge since that time.  Does note an increase in BM's but they are formed. Reports that on the day that it occurred she pushed the toilet tissue up into the vagina and it came out clean. She does have a hx of IBS and is due for follow up colonoscopy this year. She denies SOB or fatigue.   Observations/Objective:    Gen: Awake, alert, no acute distress Resp: Breathing is even and non-labored Psych: calm/pleasant demeanor Neuro: Alert and Oriented x 3, + facial symmetry, speech is clear.     Assessment and Plan:Follow Up Instructions:  Rectal discharge- New. will have pt complete IFOB x 3 and refer her back to GI for further evaluation. She has been cleared from GYN    I discussed  the assessment and treatment plan with the patient. The patient was provided an opportunity to ask questions and all were answered. The patient agreed with the plan and demonstrated an understanding of the instructions.   The patient was advised to call back or seek an in-person evaluation if the symptoms worsen or if the condition fails to improve as anticipated.    Nance Pear, NP

## 2018-06-11 DIAGNOSIS — L570 Actinic keratosis: Secondary | ICD-10-CM | POA: Diagnosis not present

## 2018-06-11 DIAGNOSIS — L820 Inflamed seborrheic keratosis: Secondary | ICD-10-CM | POA: Diagnosis not present

## 2018-06-11 DIAGNOSIS — Z859 Personal history of malignant neoplasm, unspecified: Secondary | ICD-10-CM | POA: Diagnosis not present

## 2018-06-14 ENCOUNTER — Encounter: Payer: Self-pay | Admitting: Family

## 2018-06-14 ENCOUNTER — Other Ambulatory Visit: Payer: Self-pay | Admitting: Family

## 2018-06-14 ENCOUNTER — Other Ambulatory Visit (INDEPENDENT_AMBULATORY_CARE_PROVIDER_SITE_OTHER): Payer: Medicare Other

## 2018-06-14 DIAGNOSIS — R198 Other specified symptoms and signs involving the digestive system and abdomen: Secondary | ICD-10-CM

## 2018-06-14 LAB — FECAL OCCULT BLOOD, IMMUNOCHEMICAL: Fecal Occult Bld: NEGATIVE

## 2018-06-18 ENCOUNTER — Encounter: Payer: Self-pay | Admitting: General Surgery

## 2018-06-19 ENCOUNTER — Ambulatory Visit (INDEPENDENT_AMBULATORY_CARE_PROVIDER_SITE_OTHER): Payer: Medicare Other | Admitting: Gastroenterology

## 2018-06-19 ENCOUNTER — Other Ambulatory Visit: Payer: Self-pay

## 2018-06-19 ENCOUNTER — Telehealth: Payer: Self-pay

## 2018-06-19 ENCOUNTER — Encounter: Payer: Self-pay | Admitting: Gastroenterology

## 2018-06-19 VITALS — Ht 67.0 in | Wt 136.0 lb

## 2018-06-19 DIAGNOSIS — R194 Change in bowel habit: Secondary | ICD-10-CM

## 2018-06-19 DIAGNOSIS — R1033 Periumbilical pain: Secondary | ICD-10-CM | POA: Diagnosis not present

## 2018-06-19 DIAGNOSIS — Z86010 Personal history of colonic polyps: Secondary | ICD-10-CM

## 2018-06-19 MED ORDER — DICYCLOMINE HCL 10 MG PO CAPS: 10.0000 mg | ORAL_CAPSULE | Freq: Three times a day (TID) | ORAL | 3 refills | Status: DC

## 2018-06-19 MED ORDER — NA SULFATE-K SULFATE-MG SULF 17.5-3.13-1.6 GM/177ML PO SOLN: 1.0000 | Freq: Once | ORAL | 0 refills | Status: AC

## 2018-06-19 NOTE — Progress Notes (Signed)
History of Present Illness: This is a 63 year old female referred by [name], NP for the evaluation of rectal discharge, change in bowel habits with softer stools, periumbilical abdominal pain for 2 weeks.  She notes 2 weeks ago she had a browning rectal discharge when urinating with no bowel movement.  Since that time she has noted more frequent stools in the morning and mild periumbilical abdominal pain that improves after a bowel movement.  She states she was diagnosed with irritable bowel syndrome years ago.  iFOB was negative.  She has a history of adenomatous colon polyps and is due for 5-year surveillance colonoscopy this year. Denies weight loss, constipation, diarrhea, change in stool caliber, melena, hematochezia, nausea, vomiting, dysphagia, reflux symptoms, chest pain.      Allergies  Allergen Reactions  . Doxycycline     GI side effects  . Morphine Other (See Comments) and Nausea Only    Low blood pressure   Outpatient Medications Prior to Visit  Medication Sig Dispense Refill  . amLODipine (NORVASC) 2.5 MG tablet TAKE 1 TABLET (2.5 MG TOTAL) BY MOUTH DAILY. 90 tablet 1  . calcium carbonate (OS-CAL) 600 MG TABS Take 600 mg by mouth daily. With vitamin d    . fish oil-omega-3 fatty acids 1000 MG capsule Take 2 g by mouth daily. Triple strength omega complex    . fluticasone (FLONASE) 50 MCG/ACT nasal spray Place 2 sprays into both nostrils daily. 16 g 6  . Lactobacillus Rhamnosus, GG, (CULTURELLE PO) Take 1 tablet by mouth daily.    Marland Kitchen loratadine (CLARITIN) 10 MG tablet Take 1 tablet (10 mg total) by mouth daily. (Patient not taking: Reported on 06/18/2018) 30 tablet 11  . montelukast (SINGULAIR) 10 MG tablet Take 10 mg by mouth at bedtime.    . Multiple Vitamin (MULTIVITAMIN) tablet Take 1 tablet by mouth daily.      Marland Kitchen spironolactone (ALDACTONE) 25 MG tablet TAKE 1 TABLET (25 MG TOTAL) BY MOUTH DAILY. 90 tablet 1   No facility-administered medications prior to  visit.    Past Medical History:  Diagnosis Date  . Gilberts syndrome   . Gynecological examination    sees Dr. [name]   . Hyperlipidemia   . Hypertension   . Squamous cell carcinoma    Sees Derm [name]  . Tubular adenoma of colon 2015   Past Surgical History:  Procedure Laterality Date  . COLONOSCOPY  10-08-13   per Dr. Fuller Plan, adenomatous polyps, repear in 5 yrs  . TUBAL LIGATION     Social History   Socioeconomic History  . Marital status: Widowed    Spouse name: Not on file  . Number of children: Not on file  . Years of education: Not on file  . Highest education level: Not on file  Occupational History  . Not on file  Social Needs  . Financial resource strain: Not on file  . Food insecurity:    Worry: Not on file    Inability: Not on file  . Transportation needs:    Medical: Not on file    Non-medical: Not on file  Tobacco Use  . Smoking status: Never Smoker  . Smokeless tobacco: Never Used  Substance and Sexual Activity  . Alcohol use: Yes    Alcohol/week: 0.0 standard drinks    Comment: occ / social  . Drug use: No  . Sexual activity: Not on file  Lifestyle  . Physical activity:    Days per  week: Not on file    Minutes per session: Not on file  . Stress: Not on file  Relationships  . Social connections:    Talks on phone: Not on file    Gets together: Not on file    Attends religious service: Not on file    Active member of club or organization: Not on file    Attends meetings of clubs or organizations: Not on file    Relationship status: Not on file  Other Topics Concern  . 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 Hypertension Father   . Breast cancer Sister   .  Lymphoma Mother   . Leukemia Mother   . Colon cancer Neg Hx   . Esophageal cancer Neg Hx   . Rectal cancer Neg Hx   . Stomach cancer Neg Hx        Review of Systems: Pertinent positive and negative review of systems were noted in the above HPI section. All other review of systems were otherwise negative.    Physical Exam: Telemedicine - not performed   Assessment and Recommendations:  1.  Change in bowel habits, mucus per rectum, periumbilical abdominal pain.  Personal history of adenomatous colon polyps due for surveillance colonoscopy.  Suspected IBS.  Rule out less likely possibilities of colorectal neoplasms, IBD.  Begin dicyclomine 10 mg 3 times daily AC as needed abdominal pain.  Schedule colonoscopy.  Patient relates a preference for mid June. The risks (including bleeding, perforation, infection, missed lesions, medication reactions and possible hospitalization or surgery if complications occur), benefits, and alternatives to colonoscopy with possible biopsy and possible polypectomy were discussed with the patient and they consent to proceed.     These services were provided via telemedicine, audio only per patient request.  The patient was at home and the provider was in the office, alone.  We discussed the limitations of evaluation and management by telemedicine and the availability of in person appointments.  Patient consented for this telemedicine visit and is aware of possible charges for this service.  The other person participating in the telemedicine service was a [place] who reviewed medications, allergies, past history and completed AVS.  Time spent on call: 13 minutes    cc: [name], NP [address] [place], [place] [id number]

## 2018-06-19 NOTE — Telephone Encounter (Signed)
Scheduled patient's colonoscopy; will mail instructions.  Patient aware

## 2018-06-19 NOTE — Telephone Encounter (Signed)
Pt return call and would like for a call back

## 2018-06-19 NOTE — Patient Instructions (Signed)
We have sent the following medications to your pharmacy for you to pick up at your convenience:  Dicyclomine  You have been scheduled for a colonoscopy. Please follow written instructions given to you at your visit today.  Please pick up your prep supplies at the pharmacy within the next 1-3 days. If you use inhalers (even only as needed), please bring them with you on the day of your procedure. Your physician has requested that you go to www.startemmi.com and enter the access code given to you at your visit today. This web site gives a general overview about your procedure. However, you should still follow specific instructions given to you by our office regarding your preparation for the procedure.

## 2018-07-01 ENCOUNTER — Telehealth: Payer: Self-pay

## 2018-07-01 NOTE — Telephone Encounter (Signed)
Covid-19 screening questions  Have you traveled in the last 14 days? 5/231/2020-06/26/2018 If yes where? [facility].  Do you now or have you had a fever in the last 14 days? No   Do you have any respiratory symptoms of shortness of breath or cough now or in the last 14 days? No.  Do you have any family members or close contacts with diagnosed or suspected Covid-19 in the past 14 days? No.  Have you been tested for Covid-19 and found to be positive? No.

## 2018-07-03 ENCOUNTER — Other Ambulatory Visit: Payer: Self-pay

## 2018-07-03 ENCOUNTER — Encounter: Payer: Self-pay | Admitting: Gastroenterology

## 2018-07-03 ENCOUNTER — Ambulatory Visit (AMBULATORY_SURGERY_CENTER): Payer: Medicare Other | Admitting: Gastroenterology

## 2018-07-03 VITALS — BP 104/64 | HR 80 | Temp 97.8°F | Resp 18 | Ht 67.0 in | Wt 136.0 lb

## 2018-07-03 DIAGNOSIS — Z86010 Personal history of colonic polyps: Secondary | ICD-10-CM

## 2018-07-03 DIAGNOSIS — K648 Other hemorrhoids: Secondary | ICD-10-CM

## 2018-07-03 DIAGNOSIS — R1033 Periumbilical pain: Secondary | ICD-10-CM

## 2018-07-03 DIAGNOSIS — I10 Essential (primary) hypertension: Secondary | ICD-10-CM | POA: Diagnosis not present

## 2018-07-03 DIAGNOSIS — R194 Change in bowel habit: Secondary | ICD-10-CM

## 2018-07-03 DIAGNOSIS — R109 Unspecified abdominal pain: Secondary | ICD-10-CM | POA: Diagnosis not present

## 2018-07-03 DIAGNOSIS — K5730 Diverticulosis of large intestine without perforation or abscess without bleeding: Secondary | ICD-10-CM

## 2018-07-03 MED ORDER — SODIUM CHLORIDE 0.9 % IV SOLN: 500.0000 mL | Freq: Once | INTRAVENOUS | Status: DC

## 2018-07-03 NOTE — Patient Instructions (Signed)
YOU HAD AN ENDOSCOPIC PROCEDURE TODAY AT [facility]:   Refer to the procedure report that was given to you for any specific questions about what was found during the examination.  If the procedure report does not answer your questions, please call your gastroenterologist to clarify.  If you requested that your care partner not be given the details of your procedure findings, then the procedure report has been included in a sealed envelope for you to review at your convenience later.  YOU SHOULD EXPECT: Some feelings of bloating in the abdomen. Passage of more gas than usual.  Walking can help get rid of the air that was put into your GI tract during the procedure and reduce the bloating. If you had a lower endoscopy (such as a colonoscopy or flexible sigmoidoscopy) you may notice spotting of blood in your stool or on the toilet paper. If you underwent a bowel prep for your procedure, you may not have a normal bowel movement for a few days.  Please Note:  You might notice some irritation and congestion in your nose or some drainage.  This is from the oxygen used during your procedure.  There is no need for concern and it should clear up in a day or so.  SYMPTOMS TO REPORT IMMEDIATELY:   Following lower endoscopy (colonoscopy or flexible sigmoidoscopy):  Excessive amounts of blood in the stool  Significant tenderness or worsening of abdominal pains  Swelling of the abdomen that is new, acute  Fever of 100F or higher   Please see handouts given to you on Diverticulosis and Hemorrhoids.     For urgent or emergent issues, a gastroenterologist can be reached at any hour by calling [phone].   DIET:  We do recommend a small meal at first, but then you may proceed to your regular diet.  Drink plenty of fluids but you should avoid alcoholic beverages for 24 hours.  ACTIVITY:  You should plan to take it easy for the rest of today and you should NOT DRIVE or use heavy machinery  until tomorrow (because of the sedation medicines used during the test).    FOLLOW UP: Our staff will call the number listed on your records 48-72 hours following your procedure to check on you and address any questions or concerns that you may have regarding the information given to you following your procedure. If we do not reach you, we will leave a message.  We will attempt to reach you two times.  During this call, we will ask if you have developed any symptoms of COVID 19. If you develop any symptoms (ie: fever, flu-like symptoms, shortness of breath, cough etc.) before then, please call [phone].  If you test positive for Covid 19 in the 2 weeks post procedure, please call and report this information to [place].    If any biopsies were taken you will be contacted by phone or by letter within the next 1-3 weeks.  Please call us at [phone] if you have not heard about the biopsies in 3 weeks.    SIGNATURES/CONFIDENTIALITY: You and/or your care partner have signed paperwork which will be entered into your electronic medical record.  These signatures attest to the fact that that the information above on your After Visit Summary has been reviewed and is understood.  Full responsibility of the confidentiality of this discharge information lies with you and/or your care-partner.   Thank you for letting us take care of your healthcare needs  today.

## 2018-07-03 NOTE — Progress Notes (Signed)
Temps-cw Vital signs-jb

## 2018-07-03 NOTE — Progress Notes (Signed)
PT taken to the car per [facility]. SM

## 2018-07-03 NOTE — Op Note (Signed)
[facility] Patient Name: Megan Middleton Procedure Date: 07/03/2018 7:37 AM MRN: [id number] Endoscopist: Ladene Artist , MD Age: 63 Referring MD:  Date of Birth: 1956-01-15 Gender: Female Account #: [id number] Procedure:                Colonoscopy Indications:              Surveillance: Personal history of adenomatous                            polyps on last colonoscopy 5 years ago. Change in                            bowel habits. Periumbilical abdominal pain. Medicines:                Monitored Anesthesia Care Procedure:                Pre-Anesthesia Assessment:                           - Prior to the procedure, a History and Physical                            was performed, and patient medications and                            allergies were reviewed. The patient's tolerance of                            previous anesthesia was also reviewed. The risks                            and benefits of the procedure and the sedation                            options and risks were discussed with the patient.                            All questions were answered, and informed consent                            was obtained. Prior Anticoagulants: The patient has                            taken no previous anticoagulant or antiplatelet                            agents. ASA Grade Assessment: II - A patient with                            mild systemic disease. After reviewing the risks                            and benefits, the patient was deemed in  satisfactory condition to undergo the procedure.                           After obtaining informed consent, the colonoscope                            was passed under direct vision. Throughout the                            procedure, the patient's blood pressure, pulse, and                            oxygen saturations were monitored continuously. The                            Colonoscope was  introduced through the anus and                            advanced to the the cecum, identified by                            appendiceal orifice and ileocecal valve. The                            ileocecal valve, appendiceal orifice, and rectum                            were photographed. The quality of the bowel                            preparation was good. The colonoscopy was performed                            without difficulty. The patient tolerated the                            procedure well. Scope In: 7:42:49 AM Scope Out: 6:22:63 AM Scope Withdrawal Time: 0 hours 10 minutes 43 seconds  Total Procedure Duration: 0 hours 13 minutes 45 seconds  Findings:                 The perianal and digital rectal examinations were                            normal.                           A few small-mouthed diverticula were found in the                            left colon.                           Internal hemorrhoids were found during  retroflexion. The hemorrhoids were small and Grade                            I (internal hemorrhoids that do not prolapse).                           The exam was otherwise without abnormality on                            direct and retroflexion views. Complications:            No immediate complications. Estimated blood loss:                            None. Estimated Blood Loss:     Estimated blood loss: none. Impression:               - Mild diverticulosis in the left colon.                           - Internal hemorrhoids.                           - The examination was otherwise normal on direct                            and retroflexion views.                           - No specimens collected. Recommendation:           - Repeat colonoscopy in 7 years for surveillance.                           - Patient has a contact number available for                            emergencies. The signs and symptoms of  potential                            delayed complications were discussed with the                            patient. Return to normal activities tomorrow.                            Written discharge instructions were provided to the                            patient.                           - Resume previous diet.                           - Continue present medications. Ladene Artist, MD 07/03/2018 8:02:15 AM This report has been signed electronically.

## 2018-07-03 NOTE — Progress Notes (Signed)
Report given to PACU, vss 

## 2018-07-04 DIAGNOSIS — M79645 Pain in left finger(s): Secondary | ICD-10-CM | POA: Diagnosis not present

## 2018-07-05 ENCOUNTER — Telehealth: Payer: Self-pay

## 2018-07-05 NOTE — Telephone Encounter (Signed)
  Follow up Call-  Call back number 07/03/2018  Post procedure Call Back phone  # [phone]  Permission to leave phone message Yes  Some recent data might be hidden     Patient questions:  Do you have a fever, pain , or abdominal swelling? No. Pain Score  0 *  Have you tolerated food without any problems? Yes.    Have you been able to return to your normal activities? Yes.    Do you have any questions about your discharge instructions: Diet   No. Medications  No. Follow up visit  No.  Do you have questions or concerns about your Care? No.  Actions: * If pain score is 4 or above: No action needed, pain <4. 1. Have you developed a fever since your procedure? no  2.   Have you had an respiratory symptoms (SOB or cough) since your procedure? no  3.   Have you tested positive for COVID 19 since your procedure no  4.   Have you had any family members/close contacts diagnosed with the COVID 19 since your procedure?  no   If yes to any of these questions please route to Joylene John, RN and Alphonsa Gin, Therapist, sports.

## 2018-07-09 ENCOUNTER — Ambulatory Visit (INDEPENDENT_AMBULATORY_CARE_PROVIDER_SITE_OTHER): Payer: Medicare Other | Admitting: Family

## 2018-07-09 ENCOUNTER — Other Ambulatory Visit: Payer: Self-pay

## 2018-07-09 ENCOUNTER — Encounter: Payer: Self-pay | Admitting: Family

## 2018-07-09 ENCOUNTER — Telehealth: Payer: Self-pay | Admitting: Family

## 2018-07-09 VITALS — BP 124/71 | HR 84 | Temp 98.8°F | Resp 16 | Ht 67.0 in | Wt 138.4 lb

## 2018-07-09 DIAGNOSIS — I10 Essential (primary) hypertension: Secondary | ICD-10-CM

## 2018-07-09 DIAGNOSIS — E785 Hyperlipidemia, unspecified: Secondary | ICD-10-CM

## 2018-07-09 DIAGNOSIS — Z23 Encounter for immunization: Secondary | ICD-10-CM | POA: Diagnosis not present

## 2018-07-09 DIAGNOSIS — K589 Irritable bowel syndrome without diarrhea: Secondary | ICD-10-CM | POA: Diagnosis not present

## 2018-07-09 DIAGNOSIS — Z0000 Encounter for general adult medical examination without abnormal findings: Secondary | ICD-10-CM | POA: Diagnosis not present

## 2018-07-09 NOTE — Telephone Encounter (Signed)
Please call Dr. Juliann Pulse Richardson's office to request copy of pap smear.

## 2018-07-09 NOTE — Patient Instructions (Signed)
Continue healthy diet and regular exercise.  

## 2018-07-09 NOTE — Progress Notes (Signed)
Subjective:    Megan Middleton is a 63 y.o. female who presents for her welcome to medicare visit.   Preventive Screening-Counseling & Management  Tobacco Social History   Tobacco Use  Smoking Status Never Smoker  Smokeless Tobacco Never Used     Problems Prior to Visit 1. HTN- maintained on amlodipine and aldactone. BP Readings from Last 3 Encounters:  07/09/18 124/71  07/03/18 104/64  10/16/17 120/82   2.  Hyperlipidemia-  Lab Results  Component Value Date   CHOL 209 (H) 10/16/2017   HDL 69.80 10/16/2017   LDLCALC 119 (H) 10/16/2017   LDLDIRECT 141.1 12/05/2012   TRIG 103.0 10/16/2017   CHOLHDL 3 10/16/2017   3. IBS- followed by Dr. Fuller Plan (GI) and maintained on bentyl.  She had a colonoscopy last week.    Immunizations:  Shingrix up to date. Due Prevnar 13 Diet: healthy Exercise: 5 miles a day Colonoscopy: up to date Dexa: 2018, osteopenia, on calcium.  Pap Smear: Sees [name], reports last pap was last year Mammogram: 06/07/18 Vision: up to date Dental: up to date   Hearing Screening   125Hz  250Hz  500Hz  1000Hz  2000Hz  3000Hz  4000Hz  6000Hz  8000Hz   Right ear:           Left ear:             Visual Acuity Screening   Right eye Left eye Both eyes  Without correction: 20/40 20/20 20/20   With correction:         Current Problems (verified) Patient Active Problem List   Diagnosis Date Noted  . Influenza due to identified novel influenza A virus with other respiratory manifestations 02/09/2016  . Transient vision disturbance of left eye 12/06/2015  . Preventative health care 03/22/2015  . Other and unspecified hyperlipidemia 12/05/2012  . HTN (hypertension) 12/05/2012    Medications Prior to Visit Current Outpatient Medications on File Prior to Visit  Medication Sig Dispense Refill  . amLODipine (NORVASC) 2.5 MG tablet TAKE 1 TABLET (2.5 MG TOTAL) BY MOUTH DAILY. 90 tablet 1  . calcium carbonate (OS-CAL) 600 MG TABS Take 600 mg by mouth  daily. With vitamin d    . dicyclomine (BENTYL) 10 MG capsule Take 1 capsule (10 mg total) by mouth 3 (three) times daily before meals. 90 capsule 3  . fish oil-omega-3 fatty acids 1000 MG capsule Take 2 g by mouth daily. Triple strength omega complex    . Multiple Vitamin (MULTIVITAMIN) tablet Take 1 tablet by mouth daily.      Marland Kitchen spironolactone (ALDACTONE) 25 MG tablet TAKE 1 TABLET (25 MG TOTAL) BY MOUTH DAILY. 90 tablet 1  . fluticasone (FLONASE) 50 MCG/ACT nasal spray Place 2 sprays into both nostrils daily. (Patient not taking: Reported on 07/09/2018) 16 g 6  . Lactobacillus Rhamnosus, GG, (CULTURELLE PO) Take 1 tablet by mouth daily.    Marland Kitchen loratadine (CLARITIN) 10 MG tablet Take 1 tablet (10 mg total) by mouth daily. (Patient not taking: Reported on 07/09/2018) 30 tablet 11  . montelukast (SINGULAIR) 10 MG tablet Take 10 mg by mouth at bedtime.     Current Facility-Administered Medications on File Prior to Visit  Medication Dose Route Frequency Provider Last Rate Last Dose  . 0.9 %  sodium chloride infusion  500 mL Intravenous Once Ladene Artist, MD        Current Medications (verified) Current Outpatient Medications  Medication Sig Dispense Refill  . amLODipine (NORVASC) 2.5 MG tablet TAKE 1 TABLET (2.5 MG TOTAL)  BY MOUTH DAILY. 90 tablet 1  . calcium carbonate (OS-CAL) 600 MG TABS Take 600 mg by mouth daily. With vitamin d    . dicyclomine (BENTYL) 10 MG capsule Take 1 capsule (10 mg total) by mouth 3 (three) times daily before meals. 90 capsule 3  . fish oil-omega-3 fatty acids 1000 MG capsule Take 2 g by mouth daily. Triple strength omega complex    . Multiple Vitamin (MULTIVITAMIN) tablet Take 1 tablet by mouth daily.      Marland Kitchen spironolactone (ALDACTONE) 25 MG tablet TAKE 1 TABLET (25 MG TOTAL) BY MOUTH DAILY. 90 tablet 1  . fluticasone (FLONASE) 50 MCG/ACT nasal spray Place 2 sprays into both nostrils daily. (Patient not taking: Reported on 07/09/2018) 16 g 6  . Lactobacillus  Rhamnosus, GG, (CULTURELLE PO) Take 1 tablet by mouth daily.    Marland Kitchen loratadine (CLARITIN) 10 MG tablet Take 1 tablet (10 mg total) by mouth daily. (Patient not taking: Reported on 07/09/2018) 30 tablet 11  . montelukast (SINGULAIR) 10 MG tablet Take 10 mg by mouth at bedtime.     Current Facility-Administered Medications  Medication Dose Route Frequency Provider Last Rate Last Dose  . 0.9 %  sodium chloride infusion  500 mL Intravenous Once Ladene Artist, MD         Allergies (verified) Doxycycline and Morphine   PAST HISTORY  Family History Family History  Problem Relation Age of Onset  . Hypertension Father   . Breast cancer Sister   . Lymphoma Mother   . Leukemia Mother   . Colon cancer Neg Hx   . Esophageal cancer Neg Hx   . Rectal cancer Neg Hx   . Stomach cancer Neg Hx   . Colon polyps Neg Hx     Social History Social History   Tobacco Use  . Smoking status: Never Smoker  . Smokeless tobacco: Never Used  Substance Use Topics  . Alcohol use: Yes    Alcohol/week: 0.0 standard drinks    Comment: occ / social     Are there smokers in your home (other than you)? No  Risk Factors Current exercise habits: walks 5 miles a day  Dietary issues discussed: continues healthy diet   Cardiac risk factors: dyslipidemia., hypertension, age  Depression Screen (Note: if answer to either of the following is "Yes", a more complete depression screening is indicated)   Over the past two weeks, have you felt down, depressed or hopeless? No  Over the past two weeks, have you felt little interest or pleasure in doing things? No  Have you lost interest or pleasure in daily life? No  Do you often feel hopeless? No  Do you cry easily over simple problems? No  Activities of Daily Living In your present state of health, do you have any difficulty performing the following activities?:  Driving? No Managing money?  No Feeding yourself? No Getting from bed to chair? No . Climbing a  flight of stairs? No Preparing food and eating?: No Bathing or showering? No Getting dressed: No Getting to the toilet? No Using the toilet:No Moving around from place to place: No In the past year have you fallen or had a near fall?:No   Are you sexually active?  No  Do you have more than one partner?  No  Hearing Difficulties: No Do you often ask people to speak up or repeat themselves? No Do you experience ringing or noises in your ears? No Do you have difficulty understanding soft  or whispered voices? No   Do you feel that you have a problem with memory? No  Do you often misplace items? No  Do you feel safe at home?  Yes  Cognitive Testing  Alert? Yes  Normal Appearance?Yes  Oriented to person? Yes  Place? Yes   Time? No  Recall of three objects?  No  Can perform simple calculations? No  Displays appropriate judgment?No  Can read the correct time from a watch face?No   Advanced Directives have been discussed with the patient? Yes  List the Names of Other Physician/Practitioners you currently use: 1.  Dr. French Ana orthopedics 2.  [name] gyn  Indicate any recent Medical Services you may have received from other than [facility] providers in the past year (date may be approximate).  Immunization History  Administered Date(s) Administered  . Influenza Split 11/09/2010, 11/10/2011, 11/05/2012  . Influenza, High Dose Seasonal PF 10/30/2016  . Influenza,inj,Quad PF,6+ Mos 10/16/2017  . Influenza-Unspecified 11/06/2013, 11/26/2014, 10/27/2015  . Tdap 09/24/2010  . Zoster 04/27/2014  . Zoster Recombinat (Shingrix) 10/16/2017, 12/24/2017    Screening Tests Health Maintenance  Topic Date Due  . Hepatitis C Screening  1955-09-19  . HIV Screening  05/03/1968  . PAP SMEAR-Modifier  01/26/2017  . PNA vac Low Risk Adult (1 of 2 - PCV13) 05/04/2018  . INFLUENZA VACCINE  08/24/2018  . MAMMOGRAM  06/07/2019  . TETANUS/TDAP  09/23/2020  . COLONOSCOPY  07/02/2025  . DEXA  SCAN  Completed    All answers were reviewed with the patient and necessary referrals were made:  Nance Pear, NP   07/09/2018   History reviewed: allergies, current medications, past family history, past medical history, past social history, past surgical history and problem list  Review of Systems Pertinent items are noted in HPI.    Objective:   Body mass index is 21.68 kg/m. BP 124/71 (BP Location: Right Arm, Patient Position: Sitting, Cuff Size: Small)   Pulse 84   Temp 98.8 F (37.1 C) (Oral)   Resp 16   Ht 5\' 7"  (1.702 m)   Wt 138 lb 6.4 oz (62.8 kg)   SpO2 99%   BMI 21.68 kg/m     Physical Exam  Constitutional: She is oriented to person, place, and time. She appears well-developed and well-nourished. No distress.  HENT:  Head: Normocephalic and atraumatic.  Right Ear: Tympanic membrane and ear canal normal.  Left Ear: Tympanic membrane and ear canal normal.  Mouth/Throat: not examined wear mask  Eyes: Pupils are equal, round, and reactive to light. No scleral icterus.  Neck: Normal range of motion. No thyromegaly present.  Cardiovascular: Normal rate and regular rhythm.   No murmur heard. Pulmonary/Chest: Effort normal and breath sounds normal. No respiratory distress. He has no wheezes. She has no rales. She exhibits no tenderness.  Abdominal: Soft. Bowel sounds are normal. She exhibits no distension and no mass. There is no tenderness. There is no rebound and no guarding.  Musculoskeletal: She exhibits no edema.  Lymphadenopathy:    She has no cervical adenopathy.  Neurological: She is alert and oriented to person, place, and time. She has normal patellar reflexes. She exhibits normal muscle tone. Coordination normal.  Skin: Skin is warm and dry.  Psychiatric: She has a normal mood and affect. Her behavior is normal. Judgment and thought content normal.           Assessment & Plan:    Assessment:  HTN- bp stable on current meds. Obtain  follow  up cmet.  Hyperlipidemia- continue healthy diet, exercise. Obtain follow up lipid panel.   IBS- stable, management per GI.    Preventative care- due for prevnar today.     Plan:     During the course of the visit the patient was educated and counseled about appropriate screening and preventive services including:    Pneumococcal vaccine   Screening mammography  Bone densitometry screening  Colorectal cancer screening  Diet review for nutrition referral? Yes ____  Not Indicated __x__   Patient Instructions (the written plan) was given to the patient.  Medicare Attestation I have personally reviewed: The patient's medical and social history Their use of alcohol, tobacco or illicit drugs Their current medications and supplements The patient's functional ability including ADLs,fall risks, home safety risks, cognitive, and hearing and visual impairment Diet and physical activities Evidence for depression or mood disorders  The patient's weight, height, BMI, and visual acuity have been recorded in the chart.  I have made referrals, counseling, and provided education to the patient based on review of the above and I have provided the patient with a written personalized care plan for preventive services.     Nance Pear, NP   07/09/2018

## 2018-07-10 ENCOUNTER — Encounter: Payer: Self-pay | Admitting: Family

## 2018-07-10 LAB — COMPREHENSIVE METABOLIC PANEL
ALT: 12 U/L (ref 0–35)
AST: 15 U/L (ref 0–37)
Albumin: 4.7 g/dL (ref 3.5–5.2)
Alkaline Phosphatase: 103 U/L (ref 39–117)
BUN: 19 mg/dL (ref 6–23)
CO2: 26 mEq/L (ref 19–32)
Calcium: 9.7 mg/dL (ref 8.4–10.5)
Chloride: 103 mEq/L (ref 96–112)
Creatinine, Ser: 0.83 mg/dL (ref 0.40–1.20)
GFR: 68.95 mL/min (ref >60.00)
Glucose, Bld: 88 mg/dL (ref 70–99)
Potassium: 4.1 mEq/L (ref 3.5–5.1)
Sodium: 141 mEq/L (ref 135–145)
Total Bilirubin: 1.6 mg/dL — ABNORMAL HIGH (ref 0.2–1.2)
Total Protein: 6.7 g/dL (ref 6.0–8.3)

## 2018-07-10 LAB — LIPID PANEL
Cholesterol: 202 mg/dL — ABNORMAL HIGH (ref 0–200)
HDL: 69 mg/dL (ref >39.00)
NonHDL: 133.41
Total CHOL/HDL Ratio: 3
Triglycerides: 219 mg/dL — ABNORMAL HIGH (ref 0.0–149.0)
VLDL: 43.8 mg/dL — ABNORMAL HIGH (ref 0.0–40.0)

## 2018-07-10 LAB — LDL CHOLESTEROL, DIRECT: Direct LDL: 110 mg/dL

## 2018-07-10 NOTE — Telephone Encounter (Signed)
Request faxed

## 2018-07-11 ENCOUNTER — Ambulatory Visit: Payer: BLUE CROSS/BLUE SHIELD

## 2018-07-11 LAB — HM PAP SMEAR: HM Pap smear: NEGATIVE

## 2018-08-05 DIAGNOSIS — L570 Actinic keratosis: Secondary | ICD-10-CM | POA: Diagnosis not present

## 2018-08-05 DIAGNOSIS — L820 Inflamed seborrheic keratosis: Secondary | ICD-10-CM | POA: Diagnosis not present

## 2018-08-05 DIAGNOSIS — B078 Other viral warts: Secondary | ICD-10-CM | POA: Diagnosis not present

## 2018-09-04 ENCOUNTER — Other Ambulatory Visit: Payer: Self-pay | Admitting: Family

## 2018-09-11 DIAGNOSIS — Z01419 Encounter for gynecological examination (general) (routine) without abnormal findings: Secondary | ICD-10-CM | POA: Diagnosis not present

## 2018-09-26 ENCOUNTER — Ambulatory Visit (INDEPENDENT_AMBULATORY_CARE_PROVIDER_SITE_OTHER): Payer: Medicare Other | Admitting: Gastroenterology

## 2018-09-26 ENCOUNTER — Encounter: Payer: Self-pay | Admitting: Gastroenterology

## 2018-09-26 VITALS — BP 118/78 | HR 84 | Temp 98.8°F | Ht 67.5 in | Wt 135.0 lb

## 2018-09-26 DIAGNOSIS — K580 Irritable bowel syndrome with diarrhea: Secondary | ICD-10-CM | POA: Diagnosis not present

## 2018-09-26 MED ORDER — GLYCOPYRROLATE 2 MG PO TABS: 2.0000 mg | ORAL_TABLET | Freq: Two times a day (BID) | ORAL | 11 refills | Status: DC

## 2018-09-26 NOTE — Progress Notes (Signed)
    History of Present Illness: This is a 63 year old female with frequent bowel movements and urgency.  Symptoms are unchanged.  She notes several urgent, semi-formed bowel movements on most days generally in the morning.  She does not feel dicyclomine has been helpful.  She notes no clear dietary stressors.  Colonoscopy in June as below. Denies weight loss, abdominal pain, constipation, change in stool caliber, melena, hematochezia, nausea, vomiting, dysphagia, reflux symptoms, chest pain.  Colonoscopy 07/03/2018 - Mild diverticulosis in the left colon. - Internal hemorrhoids. - The examination was otherwise normal on direct and retroflexion views. - No specimens collected.   Current Medications, Allergies, Past Medical History, Past Surgical History, Family History and Social History were reviewed in [employer] record.    Physical Exam: General: Well developed, well nourished, no acute distress Head: Normocephalic and atraumatic Eyes:  sclerae anicteric, EOMI Ears: Normal auditory acuity Mouth: No deformity or lesions Lungs: Clear throughout to auscultation Heart: Regular rate and rhythm; no murmurs, rubs or bruits Abdomen: Soft, non tender and non distended. No masses, hepatosplenomegaly or hernias noted. Normal Bowel sounds Rectal: at colonoscopy in June Musculoskeletal: Symmetrical with no gross deformities  Pulses:  Normal pulses noted Extremities: No clubbing, cyanosis, edema or deformities noted Neurological: Alert oriented x 4, grossly nonfocal Psychological:  Alert and cooperative. Normal mood and affect   Assessment and Recommendations:  1. IBS-D.  Rule out food stressors.  Lactose avoidance for 5 days.  If no results then decrease caffeine and coffee intake.  If no results keep a food diary and attempt to correlate with symptoms.  Discontinue dicyclomine begin glycopyrrolate 2 mg p.o. twice daily.  Discontinue Culturelle and try at least 3  different probiotics for 2 weeks each as outlined in AVS.  If symptoms have not improved then proceed to a low FODMAP diet.

## 2018-09-26 NOTE — Patient Instructions (Addendum)
Stop take Culturelle and dicyclomine.   Decrease your intake of caffeine.   We have sent the following medications to your pharmacy for you to pick up at your convenience: glycopyrrolate.   Start a lactose free diet.   Try a different probiotic for several weeks such as Align, Florastor or phillips colon health. Align samples provided to you to get started.   Call back in several weeks if your symptoms are no better.   Thank you for choosing me and [facility].  Pricilla Riffle. Dagoberto Ligas., MD., Marval Regal

## 2018-10-09 DIAGNOSIS — Z23 Encounter for immunization: Secondary | ICD-10-CM | POA: Diagnosis not present

## 2018-10-21 ENCOUNTER — Encounter: Payer: Medicare Other | Admitting: Family

## 2018-10-25 ENCOUNTER — Encounter: Payer: Medicare Other | Admitting: Family

## 2018-10-29 DIAGNOSIS — M79645 Pain in left finger(s): Secondary | ICD-10-CM | POA: Diagnosis not present

## 2018-11-13 ENCOUNTER — Ambulatory Visit (INDEPENDENT_AMBULATORY_CARE_PROVIDER_SITE_OTHER): Payer: Medicare Other | Admitting: Family

## 2018-11-13 ENCOUNTER — Other Ambulatory Visit: Payer: Self-pay

## 2018-11-13 VITALS — BP 140/69 | HR 77 | Temp 97.2°F | Resp 16 | Ht 67.0 in | Wt 137.0 lb

## 2018-11-13 DIAGNOSIS — E781 Pure hyperglyceridemia: Secondary | ICD-10-CM | POA: Diagnosis not present

## 2018-11-13 DIAGNOSIS — I10 Essential (primary) hypertension: Secondary | ICD-10-CM | POA: Diagnosis not present

## 2018-11-13 LAB — COMPREHENSIVE METABOLIC PANEL
ALT: 10 U/L (ref 0–35)
AST: 14 U/L (ref 0–37)
Albumin: 4.9 g/dL (ref 3.5–5.2)
Alkaline Phosphatase: 96 U/L (ref 39–117)
BUN: 15 mg/dL (ref 6–23)
CO2: 29 mEq/L (ref 19–32)
Calcium: 9.5 mg/dL (ref 8.4–10.5)
Chloride: 101 mEq/L (ref 96–112)
Creatinine, Ser: 0.81 mg/dL (ref 0.40–1.20)
GFR: 70.85 mL/min (ref >60.00)
Glucose, Bld: 96 mg/dL (ref 70–99)
Potassium: 4 mEq/L (ref 3.5–5.1)
Sodium: 139 mEq/L (ref 135–145)
Total Bilirubin: 2.3 mg/dL — ABNORMAL HIGH (ref 0.2–1.2)
Total Protein: 7 g/dL (ref 6.0–8.3)

## 2018-11-13 MED ORDER — AMLODIPINE BESYLATE 2.5 MG PO TABS: 2.5000 mg | ORAL_TABLET | Freq: Every day | ORAL | 1 refills | Status: DC

## 2018-11-13 MED ORDER — SPIRONOLACTONE 25 MG PO TABS: ORAL_TABLET | ORAL | 1 refills | Status: DC

## 2018-11-13 NOTE — Patient Instructions (Signed)
Please complete lab work prior to leaving.   

## 2018-11-13 NOTE — Progress Notes (Signed)
Subjective:    Patient ID: Megan Middleton, female    DOB: 01/27/1955, 63 y.o.   MRN: [id number]  HPI  Pt presents today for follow up. We last saw her in June for her Welcome to Medicare visit.  HTN- maintained on amlodipine and aldactone. Reports that she continues to walk 5 miles a day.  BP Readings from Last 3 Encounters:  11/13/18 140/69  09/26/18 118/78  07/09/18 124/71   Gilbert's syndrome- last bili was 1.8.  Hypertriglyceridemia- noted last visit on lipid panel.  Had flu shot at [facility].     Review of Systems See HPI  Past Medical History:  Diagnosis Date  . Allergy    seasonal  . Gilberts syndrome   . Gynecological examination    sees Dr. [name]   . Hyperlipidemia   . Hypertension   . Squamous cell carcinoma    Sees Derm [name]  . Tubular adenoma of colon 2015     Social History   Socioeconomic History  . Marital status: Widowed    Spouse name: Not on file  . Number of children: Not on file  . Years of education: Not on file  . Highest education level: Not on file  Occupational History  . Not on file  Social Needs  . Financial resource strain: Not on file  . Food insecurity    Worry: Not on file    Inability: Not on file  . Transportation needs    Medical: Not on file    Non-medical: Not on file  Tobacco Use  . Smoking status: Never Smoker  . Smokeless tobacco: Never Used  Substance and Sexual Activity  . Alcohol use: Yes    Alcohol/week: 0.0 standard drinks    Comment: occ / social  . Drug use: No  . Sexual activity: Not Currently  Lifestyle  . Physical activity    Days per week: Not on file    Minutes per session: Not on file  . Stress: Not on file  Relationships  . Social Herbalist on phone: Not on file    Gets together: Not on file    Attends religious service: Not on file    Active member of club or organization: Not on file    Attends meetings of clubs or organizations: Not on file   Relationship status: Not on file  . Intimate partner violence    Fear of current or ex partner: Not on file    Emotionally abused: Not on file    Physically abused: Not on file    Forced sexual activity: Not on file  Other Topics Concern  . 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 collar bone surgery     jan,2020  . COLONOSCOPY  10-08-13   per Dr. Fuller Plan, adenomatous polyps, repear in 5 yrs  . TUBAL LIGATION      Family History  Problem Relation Age of Onset  . Hypertension Father   . Breast cancer Sister   . Lymphoma Mother   . Leukemia Mother   . Colon cancer Neg Hx   . Esophageal cancer Neg Hx   . Rectal cancer Neg Hx   .  Stomach cancer Neg Hx   . Colon polyps Neg Hx     Allergies  Allergen Reactions  . Doxycycline     GI side effects  . Morphine Other (See Comments) and Nausea Only    Low blood pressure    Current Outpatient Medications on File Prior to Visit  Medication Sig Dispense Refill  . calcium carbonate (OS-CAL) 600 MG TABS Take 600 mg by mouth daily. With vitamin d    . fish oil-omega-3 fatty acids 1000 MG capsule Take 2 g by mouth daily. Triple strength omega complex    . glycopyrrolate (ROBINUL) 2 MG tablet Take 1 tablet (2 mg total) by mouth 2 (two) times daily. 60 tablet 11  . Lactobacillus Rhamnosus, GG, (CULTURELLE PO) Take 1 tablet by mouth daily.    . montelukast (SINGULAIR) 10 MG tablet Take 10 mg by mouth at bedtime.    . Multiple Vitamin (MULTIVITAMIN) tablet Take 1 tablet by mouth daily.       Current Facility-Administered Medications on File Prior to Visit  Medication Dose Route Frequency Provider Last Rate Last Dose  . 0.9 %  sodium chloride infusion  500 mL Intravenous  Once Ladene Artist, MD        BP 140/69 (BP Location: Right Arm, Patient Position: Sitting, Cuff Size: Small)   Pulse 77   Temp (!) 97.2 F (36.2 C) (Temporal)   Resp 16   Ht 5\' 7"  (1.702 m)   Wt 137 lb (62.1 kg)   SpO2 100%   BMI 21.46 kg/m       Objective:   Physical Exam Constitutional:      Appearance: She is well-developed.  Neck:     Musculoskeletal: Neck supple.     Thyroid: No thyromegaly.  Cardiovascular:     Rate and Rhythm: Normal rate and regular rhythm.     Heart sounds: Normal heart sounds. No murmur.  Pulmonary:     Effort: Pulmonary effort is normal. No respiratory distress.     Breath sounds: Normal breath sounds. No wheezing.  Skin:    General: Skin is warm and dry.  Neurological:     Mental Status: She is alert and oriented to person, place, and time.  Psychiatric:        Behavior: Behavior normal.        Thought Content: Thought content normal.        Judgment: Judgment normal.           Assessment & Plan:  HTN- bp at goal for her age on current medications. Continue same.  Obtain follow up cmet.  Gilbert's syndrome- obtain follow up LFT.  Hypertriglyceridemia- discussed avoiding concentrated sweets and continuing regular exercise.

## 2019-01-21 ENCOUNTER — Telehealth: Payer: Medicare Other

## 2019-01-21 ENCOUNTER — Encounter: Payer: Self-pay | Admitting: Family Medicine

## 2019-01-21 ENCOUNTER — Ambulatory Visit (INDEPENDENT_AMBULATORY_CARE_PROVIDER_SITE_OTHER): Payer: Medicare Other | Admitting: Family Medicine

## 2019-01-21 ENCOUNTER — Other Ambulatory Visit: Payer: Self-pay

## 2019-01-21 VITALS — Temp 98.0°F | Ht 67.0 in | Wt 134.0 lb

## 2019-01-21 DIAGNOSIS — J0140 Acute pansinusitis, unspecified: Secondary | ICD-10-CM | POA: Diagnosis not present

## 2019-01-21 MED ORDER — AMOXICILLIN 875 MG PO TABS: 875.0000 mg | ORAL_TABLET | Freq: Two times a day (BID) | ORAL | 0 refills | Status: DC

## 2019-01-21 NOTE — Progress Notes (Signed)
Virtual Visit via Video Note  I connected with Megan Middleton on 01/21/19 at 11:40 AM EST by a video enabled telemedicine application and verified that I am speaking with the correct person using two identifiers.  Location: Patient: home  Provider: office    I discussed the limitations of evaluation and management by telemedicine and the availability of in person appointments. The patient expressed understanding and agreed to proceed.  History of Present Illness: Pt c/o scratchy throat since holiday.  She started singular Sunday.  She is now c/o sinus headache and teeth pain   + green mucus and blood in nose No fever,   + cough and it keeps her awake   pt was in [place] over Christmas with her daughter and grandchildren-- they drove   Past Medical History:  Diagnosis Date  . Allergy    seasonal  . Gilberts syndrome   . Gynecological examination    sees Dr. [name]   . Hyperlipidemia   . Hypertension   . Squamous cell carcinoma    Sees Derm [name]  . Tubular adenoma of colon 2015   Current Outpatient Medications on File Prior to Visit  Medication Sig Dispense Refill  . amLODipine (NORVASC) 2.5 MG tablet Take 1 tablet (2.5 mg total) by mouth daily. 90 tablet 1  . calcium carbonate (OS-CAL) 600 MG TABS Take 600 mg by mouth daily. With vitamin d    . fish oil-omega-3 fatty acids 1000 MG capsule Take 2 g by mouth daily. Triple strength omega complex    . glycopyrrolate (ROBINUL) 2 MG tablet Take 1 tablet (2 mg total) by mouth 2 (two) times daily. 60 tablet 11  . Lactobacillus Rhamnosus, GG, (CULTURELLE PO) Take 1 tablet by mouth daily.    . montelukast (SINGULAIR) 10 MG tablet Take 10 mg by mouth at bedtime.    . Multiple Vitamin (MULTIVITAMIN) tablet Take 1 tablet by mouth daily.      Marland Kitchen spironolactone (ALDACTONE) 25 MG tablet TAKE 1 TABLET (25 MG TOTAL) BY MOUTH DAILY. 90 tablet 1   Current Facility-Administered Medications on File Prior to Visit  Medication  Dose Route Frequency Provider Last Rate Last Admin  . 0.9 %  sodium chloride infusion  500 mL Intravenous Once Ladene Artist, MD       Past Surgical History:  Procedure Laterality Date  . collar bone surgery     jan,2020  . COLONOSCOPY  10-08-13   per Dr. Fuller Plan, adenomatous polyps, repear in 5 yrs  . TUBAL LIGATION     Allergies  Allergen Reactions  . Doxycycline     GI side effects  . Morphine Other (See Comments) and Nausea Only    Low blood pressure    Observations/Objective: Vitals:   01/21/19 1121  Temp: 98 F (36.7 C)  no other vitals obtained Pt is in NAD   Assessment and Plan: 1. Acute non-recurrent pansinusitis con't flonase and singulair as well as zyrtec Pt has an app for covid test tomorrow at [facility] in [place]--- it is a rapid test  - amoxicillin (AMOXIL) 875 MG tablet; Take 1 tablet (875 mg total) by mouth 2 (two) times daily.  Dispense: 20 tablet; Refill: 0   Follow Up Instructions:    I discussed the assessment and treatment plan with the patient. The patient was provided an opportunity to ask questions and all were answered. The patient agreed with the plan and demonstrated an understanding of the instructions.   The patient was  advised to call back or seek an in-person evaluation if the symptoms worsen or if the condition fails to improve as anticipated.  I provided 15 minutes of non-face-to-face time during this encounter.   Ann Held, DO

## 2019-02-25 DIAGNOSIS — D1801 Hemangioma of skin and subcutaneous tissue: Secondary | ICD-10-CM | POA: Diagnosis not present

## 2019-02-25 DIAGNOSIS — L570 Actinic keratosis: Secondary | ICD-10-CM | POA: Diagnosis not present

## 2019-02-25 DIAGNOSIS — L905 Scar conditions and fibrosis of skin: Secondary | ICD-10-CM | POA: Diagnosis not present

## 2019-02-25 DIAGNOSIS — L814 Other melanin hyperpigmentation: Secondary | ICD-10-CM | POA: Diagnosis not present

## 2019-02-25 DIAGNOSIS — D239 Other benign neoplasm of skin, unspecified: Secondary | ICD-10-CM | POA: Diagnosis not present

## 2019-02-25 DIAGNOSIS — X32XXXS Exposure to sunlight, sequela: Secondary | ICD-10-CM | POA: Diagnosis not present

## 2019-04-01 DIAGNOSIS — M79645 Pain in left finger(s): Secondary | ICD-10-CM | POA: Diagnosis not present

## 2019-05-01 ENCOUNTER — Other Ambulatory Visit: Payer: Self-pay | Admitting: Obstetrics and Gynecology

## 2019-05-01 DIAGNOSIS — Z1231 Encounter for screening mammogram for malignant neoplasm of breast: Secondary | ICD-10-CM

## 2019-05-24 ENCOUNTER — Other Ambulatory Visit: Payer: Self-pay | Admitting: Family

## 2019-06-11 ENCOUNTER — Ambulatory Visit
Admission: RE | Admit: 2019-06-11 | Discharge: 2019-06-11 | Disposition: A | Discharge status: Home / Self Care | Payer: Medicare Other | Source: Ambulatory Visit | Attending: Obstetrics and Gynecology | Admitting: Obstetrics and Gynecology

## 2019-06-11 ENCOUNTER — Other Ambulatory Visit: Payer: Self-pay

## 2019-06-11 DIAGNOSIS — Z1231 Encounter for screening mammogram for malignant neoplasm of breast: Secondary | ICD-10-CM | POA: Diagnosis not present

## 2019-06-19 DIAGNOSIS — L570 Actinic keratosis: Secondary | ICD-10-CM | POA: Diagnosis not present

## 2019-06-19 DIAGNOSIS — L578 Other skin changes due to chronic exposure to nonionizing radiation: Secondary | ICD-10-CM | POA: Diagnosis not present

## 2019-06-19 DIAGNOSIS — W908XXS Exposure to other nonionizing radiation, sequela: Secondary | ICD-10-CM | POA: Diagnosis not present

## 2019-07-11 ENCOUNTER — Other Ambulatory Visit: Payer: Self-pay

## 2019-07-11 ENCOUNTER — Encounter: Payer: Self-pay | Admitting: Medical

## 2019-07-11 ENCOUNTER — Ambulatory Visit (INDEPENDENT_AMBULATORY_CARE_PROVIDER_SITE_OTHER): Payer: Medicare Other | Admitting: Medical

## 2019-07-11 ENCOUNTER — Telehealth: Payer: Self-pay | Admitting: Family

## 2019-07-11 VITALS — BP 130/76 | HR 105 | Resp 18 | Ht 67.0 in | Wt 135.0 lb

## 2019-07-11 DIAGNOSIS — R252 Cramp and spasm: Secondary | ICD-10-CM | POA: Diagnosis not present

## 2019-07-11 LAB — COMPREHENSIVE METABOLIC PANEL
ALT: 16 U/L (ref 0–35)
AST: 40 U/L — ABNORMAL HIGH (ref 0–37)
Albumin: 5.1 g/dL (ref 3.5–5.2)
Alkaline Phosphatase: 82 U/L (ref 39–117)
BUN: 15 mg/dL (ref 6–23)
CO2: 30 mEq/L (ref 19–32)
Calcium: 9.8 mg/dL (ref 8.4–10.5)
Chloride: 101 mEq/L (ref 96–112)
Creatinine, Ser: 0.92 mg/dL (ref 0.40–1.20)
GFR: 61.04 mL/min (ref >60.00)
Glucose, Bld: 94 mg/dL (ref 70–99)
Potassium: 3.9 mEq/L (ref 3.5–5.1)
Sodium: 135 mEq/L (ref 135–145)
Total Bilirubin: 2.8 mg/dL — ABNORMAL HIGH (ref 0.2–1.2)
Total Protein: 7.6 g/dL (ref 6.0–8.3)

## 2019-07-11 LAB — MAGNESIUM: Magnesium: 2.5 mg/dL (ref 1.5–2.5)

## 2019-07-11 MED ORDER — CYCLOBENZAPRINE HCL 5 MG PO TABS: ORAL_TABLET | ORAL | 0 refills | Status: DC

## 2019-07-11 NOTE — Progress Notes (Signed)
Subjective:    Patient ID: Megan Middleton, female    DOB: 1955/02/16, 64 y.o.   MRN: [id number]  HPI  Pt in for recent leg cramps. She states hx of cramps in past as well. She states yesterday in pool standing for 45 minutes. She felt it when she tried to get out of pool. She had severe cramp. She tried to stretch her calfs and tried to stretch it out but would not ease up for about 15 minutes. Had to wait a while before she could get of of pool.  No new medications. No statins.  Pt is on spironolactone. Years ago on other diuretics got cramping.   Yesterday cramp more severe than usual. No dvt pain. No sob or wheezing.  Pt walks often 2.5 miles every morning. When walking has no cramps.  Pt when moved from [place] was on potassium.    Review of Systems  Constitutional: Negative for chills, fatigue and fever.  Respiratory: Negative for chest tightness, shortness of breath and wheezing.   Cardiovascular: Negative for chest pain and palpitations.  Gastrointestinal: Negative for abdominal pain.  Musculoskeletal: Negative for back pain, joint swelling and myalgias.       Calf cramps.  Neurological: Negative for dizziness, light-headedness and headaches.  Hematological: Negative for adenopathy. Does not bruise/bleed easily.  Psychiatric/Behavioral: Negative for dysphoric mood.   Past Medical History:  Diagnosis Date  . Allergy    seasonal  . Gilberts syndrome   . Gynecological examination    sees Dr. [name]   . Hyperlipidemia   . Hypertension   . Squamous cell carcinoma    Sees Derm [name]  . Tubular adenoma of colon 2015     Social History   Socioeconomic History  . Marital status: Widowed    Spouse name: Not on file  . Number of children: Not on file  . Years of education: Not on file  . Highest education level: Not on file  Occupational History  . Not on file  Tobacco Use  . Smoking status: Never Smoker  . Smokeless tobacco: Never Used    Vaping Use  . Vaping Use: Never assessed  Substance and Sexual Activity  . Alcohol use: Yes    Alcohol/week: 0.0 standard drinks    Comment: occ / social  . Drug use: No  . Sexual activity: Not Currently  Other Topics Concern  . 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 Difficulty of Paying Living Expenses:   Food Insecurity:   . Worried About Charity fundraiser in the Last Year:   . Arboriculturist in the Last Year:   Transportation Needs:   . Film/video editor (Medical):   Marland Kitchen Lack of Transportation (Non-Medical):   Physical Activity:   . Days of Exercise per Week:   . Minutes of Exercise per Session:   Stress:   . Feeling of Stress :   Social Connections:   . Frequency of Communication with Friends and Family:   . Frequency of Social Gatherings with Friends and Family:   . Attends Religious Services:   . Active Member of Clubs or Organizations:   . Attends  Club or Organization Meetings:   Marland Kitchen Marital Status:   Intimate Partner Violence:   . Fear of Current or Ex-Partner:   . Emotionally Abused:   Marland Kitchen Physically Abused:   . Sexually Abused:     Past Surgical History:  Procedure Laterality Date  . collar bone surgery     jan,2020  . COLONOSCOPY  10-08-13   per Dr. Fuller Plan, adenomatous polyps, repear in 5 yrs  . TUBAL LIGATION      Family History  Problem Relation Age of Onset  . Hypertension Father   . Breast cancer Sister   . Lymphoma Mother   . Leukemia Mother   . Colon cancer Neg Hx   . Esophageal cancer Neg Hx   . Rectal cancer Neg Hx   . Stomach cancer Neg Hx   . Colon polyps Neg Hx     Allergies  Allergen Reactions  . Doxycycline     GI side effects  . Morphine Other  (See Comments) and Nausea Only    Low blood pressure    Current Outpatient Medications on File Prior to Visit  Medication Sig Dispense Refill  . amLODipine (NORVASC) 2.5 MG tablet TAKE 1 TABLET BY MOUTH EVERY DAY 90 tablet 1  . calcium carbonate (OS-CAL) 600 MG TABS Take 600 mg by mouth daily. With vitamin d    . fish oil-omega-3 fatty acids 1000 MG capsule Take 2 g by mouth daily. Triple strength omega complex    . glycopyrrolate (ROBINUL) 2 MG tablet Take 1 tablet (2 mg total) by mouth 2 (two) times daily. 60 tablet 11  . Lactobacillus Rhamnosus, GG, (CULTURELLE PO) Take 1 tablet by mouth daily.    . montelukast (SINGULAIR) 10 MG tablet Take 10 mg by mouth at bedtime.    . Multiple Vitamin (MULTIVITAMIN) tablet Take 1 tablet by mouth daily.      Marland Kitchen spironolactone (ALDACTONE) 25 MG tablet TAKE 1 TABLET BY MOUTH EVERY DAY 90 tablet 1  . amoxicillin (AMOXIL) 875 MG tablet Take 1 tablet (875 mg total) by mouth 2 (two) times daily. (Patient not taking: Reported on 07/11/2019) 20 tablet 0   Current Facility-Administered Medications on File Prior to Visit  Medication Dose Route Frequency Provider Last Rate Last Admin  . 0.9 %  sodium chloride infusion  500 mL Intravenous Once Ladene Artist, MD        BP 130/76 (BP Location: Right Arm, Patient Position: Sitting, Cuff Size: Normal)   Pulse (!) 105   Resp 18   Ht 5\' 7"  (1.702 m)   Wt 135 lb (61.2 kg)   SpO2 97%   BMI 21.14 kg/m       Objective:   Physical Exam  General- No acute distress. Pleasant patient. Neck- Full range of motion, no jvd Lungs- Clear, even and unlabored. Heart- regular rate and rhythm. Neurologic- CNII- XII grossly intact.   Lower ext- bilateral calfs symmetric. Negative homans signs. Calf mild tender mid calf tenderness. No redness, no swelling. Pulses normal and goodcapillary refill.         Assessment & Plan:  For recent severe cramps, will get cmp stat today. Also get magnesium level.  Do stretch  legs/calf daily. No excessive walks or exercise for 3 days.  Low dose flexeril 5 mg q hs for next 3 nights if needed(rx advisement on side effects.)  bp well controlled.   Continue spirinlactone and amlodipine.   Stay hydrated with propel when exercising.  Follow up in 2  week or as needed.  Discussed possible spirinolactone decrease dosing and maybe increase amlodipine  Time spent with patient today was 35  minutes which consisted of chart revdiew, discussing differntial  diagnosis, work up, treatment, answering questions and documentation.

## 2019-07-11 NOTE — Patient Instructions (Addendum)
For recent severe cramps, will get cmp stat today. Also get magnesium level.  Do stretch legs/calf daily. No excessive walks or exercise for 3 days.  Low dose flexeril 5 mg q hs for next 3 nights if needed(rx advisement on side effects.)  Bp well controlled.   Continue spirinlactone and amlodipine.   Stay hydrated with propel when exercising.  Follow up in 2 week or as needed.

## 2019-07-11 NOTE — Telephone Encounter (Signed)
Caller: Falen Call back phone number: [phone]  Patient is asking to see [facility] today for leg cramps. I schedule an appointment with Percell Miller this afternoon is all we have. However, she insists for me to send a note.

## 2019-07-11 NOTE — Telephone Encounter (Signed)
Was seen by Percell Miller today

## 2019-07-14 ENCOUNTER — Telehealth: Payer: Self-pay | Admitting: Medical

## 2019-07-14 DIAGNOSIS — R252 Cramp and spasm: Secondary | ICD-10-CM

## 2019-07-14 MED ORDER — CYCLOBENZAPRINE HCL 5 MG PO TABS: ORAL_TABLET | ORAL | 0 refills | Status: DC

## 2019-07-14 NOTE — Telephone Encounter (Signed)
Rx flexeril sent to pharmacy again.

## 2019-07-21 DIAGNOSIS — M79645 Pain in left finger(s): Secondary | ICD-10-CM | POA: Diagnosis not present

## 2019-07-25 ENCOUNTER — Other Ambulatory Visit: Payer: Self-pay

## 2019-07-25 ENCOUNTER — Ambulatory Visit (INDEPENDENT_AMBULATORY_CARE_PROVIDER_SITE_OTHER): Payer: Medicare Other | Admitting: Family

## 2019-07-25 ENCOUNTER — Encounter: Payer: Self-pay | Admitting: Family

## 2019-07-25 VITALS — BP 135/82 | HR 87 | Temp 97.5°F | Resp 16 | Ht 67.5 in | Wt 135.0 lb

## 2019-07-25 DIAGNOSIS — F419 Anxiety disorder, unspecified: Secondary | ICD-10-CM | POA: Diagnosis not present

## 2019-07-25 DIAGNOSIS — R252 Cramp and spasm: Secondary | ICD-10-CM | POA: Diagnosis not present

## 2019-07-25 MED ORDER — AMLODIPINE BESYLATE 5 MG PO TABS
5.0000 mg | ORAL_TABLET | Freq: Every day | ORAL | 3 refills | Status: DC
Start: 2019-07-25 — End: 2019-10-31

## 2019-07-25 MED ORDER — ESCITALOPRAM OXALATE 5 MG PO TABS
5.0000 mg | ORAL_TABLET | Freq: Every day | ORAL | 5 refills | Status: DC
Start: 2019-07-25 — End: 2019-08-15

## 2019-07-25 NOTE — Patient Instructions (Signed)
Stop aldactone. Increase amlodipine from 2.5 to 5mg  once daily.

## 2019-07-25 NOTE — Progress Notes (Signed)
Subjective:    Patient ID: Megan Middleton, female    DOB: [birth year], 64 y.o.   MRN: [id number]  HPI  Patient is a 64 year old female who presents today for follow-up.She saw Evern Core PA-C on 6/18. Reports that she was at the pool standing still for about 1 hour prior to that visit.  She reports that she developed pain/cramping in the back of both lower legs.  She reports that she had trouble flatting her foot.  She was able to walk up and down the pool. Felt weak/sweaty ("I might have been having a panic attack"). Legs were shaking. She walked to the car and drove home. When she got home her calf muscles were "so sore."    He gave her flexeril PRN. She had trouble getting this prescription. She tried hyland homeopathic pills.  She drove down to the beach the next day.  By the 5th day it was not as sore. Today feels OK. She is very fearful of this cramping coming back.  She feels "anxious inside." She also is helping a good friend who is dying of glioblastoma.   She was placed on aldactone 15 years ago due to an episode of LE edema.    BP Readings from Last 3 Encounters:  07/25/19 135/82  07/11/19 130/76  11/13/18 140/69   Anxiety- staying active. Has a good attitude about things but has been feeling anxious for the last 2 months. Feels that this is negatively impacting her quality of life. She is interested in trying a medicine to help her with her anxiety.  Review of Systems See  HPI  Past Medical History:  Diagnosis Date  . Allergy    seasonal  . Gilberts syndrome   . Gynecological examination    sees Dr. [name]   . Hyperlipidemia   . Hypertension   . Squamous cell carcinoma    Sees Derm [name]  . Tubular adenoma of colon 2015     Social History   Socioeconomic History  . Marital status: Widowed    Spouse name: Not on file  . Number of children: Not on file  . Years of education: Not on file  . Highest education level: Not on file    Occupational History  . Not on file  Tobacco Use  . Smoking status: Never Smoker  . Smokeless tobacco: Never Used  Vaping Use  . Vaping Use: Never assessed  Substance and Sexual Activity  . Alcohol use: Yes    Alcohol/week: 0.0 standard drinks    Comment: occ / social  . Drug use: No  . Sexual activity: Not Currently  Other Topics Concern  . 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 Difficulty of Paying Living Expenses:   Food Insecurity:   . Worried About Charity fundraiser in the Last Year:   . Arboriculturist in the Last Year:   Transportation Needs:   . Film/video editor (Medical):   Marland Kitchen Lack of Transportation (Non-Medical):   Physical Activity:   . Days of Exercise per Week:   . Minutes of Exercise per Session:   Stress:   . Feeling of Stress :  Social Connections:   . Frequency of Communication with Friends and Family:   . Frequency of Social Gatherings with Friends and Family:   . Attends Religious Services:   . Active Member of Clubs or Organizations:   . Attends Archivist Meetings:   Marland Kitchen Marital Status:   Intimate Partner Violence:   . Fear of Current or Ex-Partner:   . Emotionally Abused:   Marland Kitchen Physically Abused:   . Sexually Abused:     Past Surgical History:  Procedure Laterality Date  . collar bone surgery     jan,2020  . COLONOSCOPY  10-08-13   per Dr. Fuller Plan, adenomatous polyps, repear in 5 yrs  . TUBAL LIGATION      Family History  Problem Relation Age of Onset  . Hypertension Father   . Breast cancer Sister   . Lymphoma Mother   . Leukemia Mother   . Colon cancer Neg Hx   . Esophageal cancer Neg Hx   . Rectal cancer Neg Hx   . Stomach cancer Neg Hx    . Colon polyps Neg Hx     Allergies  Allergen Reactions  . Doxycycline     GI side effects  . Morphine Other (See Comments) and Nausea Only    Low blood pressure    Current Outpatient Medications on File Prior to Visit  Medication Sig Dispense Refill  . calcium carbonate (OS-CAL) 600 MG TABS Take 600 mg by mouth daily. With vitamin d    . fish oil-omega-3 fatty acids 1000 MG capsule Take 2 g by mouth daily. Triple strength omega complex    . glycopyrrolate (ROBINUL) 2 MG tablet Take 1 tablet (2 mg total) by mouth 2 (two) times daily. 60 tablet 11  . Lactobacillus Rhamnosus, GG, (CULTURELLE PO) Take 1 tablet by mouth daily.    . Multiple Vitamin (MULTIVITAMIN) tablet Take 1 tablet by mouth daily.       Current Facility-Administered Medications on File Prior to Visit  Medication Dose Route Frequency Provider Last Rate Last Admin  . 0.9 %  sodium chloride infusion  500 mL Intravenous Once Ladene Artist, MD        BP 135/82 (BP Location: Right Arm, Patient Position: Sitting, Cuff Size: Small)   Pulse 87   Temp (!) 97.5 F (36.4 C) (Temporal)   Resp 16   Ht 5' 7.5" (1.715 m)   Wt 135 lb (61.2 kg)   SpO2 100%   BMI 20.83 kg/m       Objective:   Physical Exam Constitutional:      Appearance: She is well-developed.  Cardiovascular:     Rate and Rhythm: Normal rate and regular rhythm.     Pulses:          Popliteal pulses are 2+ on the right side and 2+ on the left side.       Dorsalis pedis pulses are 2+ on the right side and 2+ on the left side.     Heart sounds: Normal heart sounds. No murmur heard.   Pulmonary:     Effort: Pulmonary effort is normal. No respiratory distress.     Breath sounds: Normal breath sounds. No wheezing.  Psychiatric:        Behavior: Behavior normal.        Thought Content: Thought content normal.        Judgment: Judgment normal.           Assessment & Plan:  Anxiety-uncontrolled.  Will  give a trial of Lexapro 5 mg once daily.We  discussed common side effects such as nausea, drowsiness.  Also discussed rare but serious side effect of suicide ideation.  She is instructed to discontinue medication go directly to ED if this occurs.  Pt verbalizes understanding.  Plan follow up in 1 month to evaluate progress.    Bilateral leg cramps-I am concerned that her Aldactone may be contributing.  I recommend the following:  Stop aldactone. Increase amlodipine from 2.5 to 5mg  once daily.  This visit occurred during the SARS-CoV-2 public health emergency.  Safety protocols were in place, including screening questions prior to the visit, additional usage of staff PPE, and extensive cleaning of exam room while observing appropriate contact time as indicated for disinfecting solutions.

## 2019-08-05 ENCOUNTER — Encounter (HOSPITAL_COMMUNITY): Payer: Medicare Other

## 2019-08-05 ENCOUNTER — Ambulatory Visit (HOSPITAL_COMMUNITY)
Admission: RE | Admit: 2019-08-05 | Discharge: 2019-08-05 | Disposition: A | Discharge status: Home / Self Care | Payer: Medicare Other | Source: Ambulatory Visit | Attending: Family | Admitting: Family

## 2019-08-05 ENCOUNTER — Other Ambulatory Visit: Payer: Self-pay

## 2019-08-05 ENCOUNTER — Ambulatory Visit (HOSPITAL_COMMUNITY): Payer: Medicare Other

## 2019-08-05 DIAGNOSIS — R252 Cramp and spasm: Secondary | ICD-10-CM | POA: Insufficient documentation

## 2019-08-07 ENCOUNTER — Encounter (HOSPITAL_COMMUNITY): Payer: Medicare Other

## 2019-08-12 NOTE — Progress Notes (Signed)
I connected with Megan Middleton today by telephone and verified that I am speaking with the correct person using two identifiers. Location patient: home Location provider: work Persons participating in the virtual visit: patient, Marine scientist.    I discussed the limitations, risks, security and privacy concerns of performing an evaluation and management service by telephone and the availability of in person appointments. I also discussed with the patient that there may be a patient responsible charge related to this service. The patient expressed understanding and verbally consented to this telephonic visit.    Interactive audio and video telecommunications were attempted between this provider and patient, however failed, due to patient having technical difficulties OR patient did not have access to video capability.  We continued and completed visit with audio only.  Some vital signs may be absent or patient reported.    Subjective:   Megan Middleton is a 64 y.o. female who presents for Medicare Annual (Subsequent) preventive examination.  Review of Systems    Cardiac Risk Factors include: advanced age (>25men, >62 women);dyslipidemia;hypertension     Objective:    Today's Vitals   08/13/19 0806  BP: 125/72   There is no height or weight on file to calculate BMI.  Advanced Directives 08/13/2019 03/08/2014 10/08/2013 09/24/2013  Does Patient Have a Medical Advance Directive? Yes No Yes Yes  Type of Paramedic of Center;Living will - Healthcare Power of [place];Living will  Does patient want to make changes to medical advance directive? No - Patient declined - - -  Copy of [place] in Chart? No - copy requested - - -  Would patient like information on creating a medical advance directive? - No - patient declined information - -    Current Medications (verified) Outpatient Encounter Medications as of 08/13/2019    Medication Sig  . amLODipine (NORVASC) 5 MG tablet Take 1 tablet (5 mg total) by mouth daily.  . calcium carbonate (OS-CAL) 600 MG TABS Take 600 mg by mouth daily. With vitamin d  . escitalopram (LEXAPRO) 5 MG tablet Take 1 tablet (5 mg total) by mouth daily.  . NON FORMULARY JUICE PLUS  . glycopyrrolate (ROBINUL) 2 MG tablet Take 1 tablet (2 mg total) by mouth 2 (two) times daily. (Patient not taking: Reported on 08/13/2019)  . [DISCONTINUED] fish oil-omega-3 fatty acids 1000 MG capsule Take 2 g by mouth daily. Triple strength omega complex  . [DISCONTINUED] Lactobacillus Rhamnosus, GG, (CULTURELLE PO) Take 1 tablet by mouth daily.  . [DISCONTINUED] Multiple Vitamin (MULTIVITAMIN) tablet Take 1 tablet by mouth daily.     Facility-Administered Encounter Medications as of 08/13/2019  Medication  . 0.9 %  sodium chloride infusion    Allergies (verified) Doxycycline and Morphine   History: Past Medical History:  Diagnosis Date  . Allergy    seasonal  . Gilberts syndrome   . Gynecological examination    sees Dr. [name]   . Hyperlipidemia   . Hypertension   . Squamous cell carcinoma    Sees Derm [name]  . Tubular adenoma of colon 2015   Past Surgical History:  Procedure Laterality Date  . collar bone surgery     jan,2020  . COLONOSCOPY  10-08-13   per Dr. Fuller Plan, adenomatous polyps, repear in 5 yrs  . TUBAL LIGATION     Family History  Problem Relation Age of Onset  . Hypertension Father   . Breast cancer Sister   . Lymphoma Mother   .  Leukemia Mother   . Colon cancer Neg Hx   . Esophageal cancer Neg Hx   . Rectal cancer Neg Hx   . Stomach cancer Neg Hx   . Colon polyps Neg Hx    Social History   Socioeconomic History  . Marital status: Widowed    Spouse name: Not on file  . Number of children: Not on file  . Years of education: Not on file  . Highest education level: Not on file  Occupational History  . Not on file  Tobacco Use  . Smoking  status: Never Smoker  . Smokeless tobacco: Never Used  Vaping Use  . Vaping Use: Never assessed  Substance and Sexual Activity  . Alcohol use: Yes    Alcohol/week: 0.0 standard drinks    Comment: occ / social  . Drug use: No  . Sexual activity: Not Currently  Other Topics Concern  . 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 Difficulty of Paying Living Expenses: Not hard at all  Food Insecurity: No Food Insecurity  . Worried About Charity fundraiser in the Last Year: Never true  . Ran Out of Food in the Last Year: Never true  Transportation Needs: No Transportation Needs  . Lack of Transportation (Medical): No  . Lack of Transportation (Non-Medical): No  Physical Activity:   . Days of Exercise per Week:   . Minutes of Exercise per Session:   Stress:   . Feeling of Stress :   Social Connections:   . Frequency of Communication with Friends and Family:   . Frequency of Social Gatherings with Friends and Family:   . Attends Religious Services:   . Active Member of Clubs or Organizations:   . Attends Archivist Meetings:   Marland Kitchen Marital Status:     Tobacco Counseling Counseling given: Not Answered   Clinical Intake:     Pain : No/denies pain     Activities of Daily Living In your present state of health, do you have any difficulty performing the following activities: 08/13/2019 07/25/2019  Hearing? N N  Vision? N N  Difficulty concentrating or making decisions? N N  Walking or climbing stairs? N N  Dressing or bathing? N N  Doing errands, shopping? N N  Preparing Food and eating ? N -  Using the Toilet? N -  In the past six months, have you accidently leaked urine? N -  Do  you have problems with loss of bowel control? N -  Managing your Medications? N -  Managing your Finances? N -  Housekeeping or managing your Housekeeping? N -  Some recent data might be hidden    Patient Care Team: [name], NP as PCP - General (Internal Medicine) [name], MD as Consulting Physician (Obstetrics and Gynecology) Earlie Server, MD as Consulting Physician (Orthopedic Surgery)  Indicate any recent Medical Services you may have received from other than [facility] providers in the past year (date may be approximate).     Assessment:   This is a routine wellness examination for [place].   Dietary issues and exercise activities discussed: Current Exercise Habits: Home exercise routine, Type of exercise: walking;strength  training/weights, Time (Minutes): 60, Frequency (Times/Week): 5, Weekly Exercise (Minutes/Week): 300, Intensity: Mild, Exercise limited by: None identified Diet (meal preparation, eat out, water intake, caffeinated beverages, dairy products, fruits and vegetables): well balanced   Goals    . Maintain healthy active lifestyle      Depression Screen PHQ 2/9 Scores 08/13/2019 07/25/2019 04/23/2017 04/03/2016 01/03/2016 12/06/2015  PHQ - 2 Score 0 0 0 0 0 0  PHQ- 9 Score - 4 1 - - -    Fall Risk Fall Risk  08/13/2019 07/25/2019 01/03/2016 12/06/2015  Falls in the past year? 0 0 No No  Number falls in past yr: 0 0 - -  Injury with Fall? 0 0 - -  Follow up Education provided;Falls prevention discussed - - -   Lives alone in 1 story home. Any stairs in or around the home? No  If so, are there any without handrails? No  Home free of loose throw rugs in walkways, pet beds, electrical cords, etc? Yes  Adequate lighting in your home to reduce risk of falls? Yes   ASSISTIVE DEVICES UTILIZED TO PREVENT FALLS: none   Cognitive Function: Ad8 score reviewed for issues:  Issues making decisions: no  Less interest in hobbies /  activities:no  Repeats questions, stories (family complaining):no  Trouble using ordinary gadgets (microwave, computer, phone):no  Forgets the month or year: no  Mismanaging finances: no  Remembering appts:no  Daily problems with thinking and/or memory:no Ad8 score is=0          Immunizations Immunization History  Administered Date(s) Administered  . Fluad Quad(high Dose 65+) 10/09/2018  . Influenza Split 11/09/2010, 11/10/2011, 11/05/2012  . Influenza, High Dose Seasonal PF 10/30/2016  . Influenza,inj,Quad PF,6+ Mos 10/16/2017  . Influenza-Unspecified 11/06/2013, 11/26/2014, 10/27/2015  . PFIZER SARS-COV-2 Vaccination 02/12/2019, 03/05/2019  . Pneumococcal Conjugate-13 07/09/2018  . Tdap 09/24/2010  . Zoster 04/27/2014  . Zoster Recombinat (Shingrix) 10/16/2017, 12/24/2017    TDAP status: Up to date Flu Vaccine status: Up to date Covid-19 vaccine status: Completed vaccines  Qualifies for Shingles Vaccine? Yes   Zostavax completed Yes   Shingrix Completed?: Yes  Screening Tests Health Maintenance  Topic Date Due  . Hepatitis C Screening  Never done  . PNA vac Low Risk Adult (2 of 2 - PPSV23) 07/09/2019  . INFLUENZA VACCINE  08/24/2019  . MAMMOGRAM  06/10/2020  . TETANUS/TDAP  09/23/2020  . COLONOSCOPY  07/02/2025  . DEXA SCAN  Completed  . COVID-19 Vaccine  Completed    Health Maintenance  Health Maintenance Due  Topic Date Due  . Hepatitis C Screening  Never done  . PNA vac Low Risk Adult (2 of 2 - PPSV23) 07/09/2019    Colorectal cancer screening: Completed 07/03/18. Repeat every 7 years Mammogram status: Completed 06/12/19. Repeat every year Bone Density status: Completed 08/02/16. Results reflect: Bone density results: OSTEOPENIA. Repeat every 2 years. Pt will discuss w/ PCP 08/15/19   Lung Cancer Screening: (Low Dose CT Chest recommended if Age 3-80 years, 30 pack-year currently smoking OR have quit w/in 15years.) does not qualify.      Additional Screening:  Vision Screening: Recommended annual ophthalmology exams for early detection of glaucoma and other disorders of the eye. Is the patient up to date with their annual eye exam?  Yes  Who is the provider or what is the name of the office in which the patient attends annual eye exams? [facility] [place] Screening: Recommended annual dental exams for proper  oral hygiene  Community Resource Referral / Chronic Care Management: CRR required this visit?  No   CCM required this visit?  No      Plan:    Please schedule your next medicare wellness visit with me in 1 yr.  Continue to eat heart healthy diet (full of fruits, vegetables, whole grains, lean protein, water--limit salt, fat, and sugar intake) and increase physical activity as tolerated.  Continue doing brain stimulating activities (puzzles, reading, adult coloring books, staying active) to keep memory sharp.   Bring a copy of your living will and/or healthcare power of attorney to your next office visit.    I have personally reviewed and noted the following in the patient's chart:   . Medical and social history . Use of alcohol, tobacco or illicit drugs  . Current medications and supplements . Functional ability and status . Nutritional status . Physical activity . Advanced directives . List of other physicians . Hospitalizations, surgeries, and ER visits in previous 12 months . Vitals . Screenings to include cognitive, depression, and falls . Referrals and appointments  In addition, I have reviewed and discussed with patient certain preventive protocols, quality metrics, and best practice recommendations. A written personalized care plan for preventive services as well as general preventive health recommendations were provided to patient.   Due to this being a telephonic visit, the after visit summary with patients personalized plan was offered to patient via mail or my-chart. Patient to  pick up at office at next visit.   Shela Nevin, [place]   08/13/2019   Nurse Notes: Pt enjoys spending time w/ her grandkids, bible study, and going to the gym.

## 2019-08-13 ENCOUNTER — Ambulatory Visit (INDEPENDENT_AMBULATORY_CARE_PROVIDER_SITE_OTHER): Payer: Medicare Other | Admitting: *Deleted

## 2019-08-13 ENCOUNTER — Encounter: Payer: Self-pay | Admitting: *Deleted

## 2019-08-13 ENCOUNTER — Other Ambulatory Visit: Payer: Self-pay

## 2019-08-13 VITALS — BP 125/72

## 2019-08-13 DIAGNOSIS — Z0000 Encounter for general adult medical examination without abnormal findings: Secondary | ICD-10-CM | POA: Diagnosis not present

## 2019-08-13 NOTE — Patient Instructions (Signed)
Megan Middleton , Thank you for taking time to come for your Medicare Wellness Visit. I appreciate your ongoing commitment to your health goals. Please review the following plan we discussed and let me know if I can assist you in the future.   Screening recommendations/referrals: Colorectal cancer screening: Completed 07/03/18. Repeat every 7 years Mammogram status: Completed 06/12/19. Repeat every year Bone Density status: Completed 08/02/16. Results reflect: Bone density results: OSTEOPENIA. Repeat every 2 years. Recommended yearly ophthalmology/optometry visit for glaucoma screening and checkup Recommended yearly dental visit for hygiene and checkup  Vaccinations: TDAP status: Up to date Flu Vaccine status: Up to date Covid-19 vaccine status: Completed vaccines  Qualifies for Shingles Vaccine? Yes   Zostavax completed Yes   Shingrix Completed?: Yes  Advanced directives: Bring a copy of your living will and/or healthcare power of attorney to your next office visit.  Next appointment: Follow up in one year for your annual wellness visit.   Preventive Care 75 Years and Older, Female Preventive care refers to lifestyle choices and visits with your health care provider that can promote health and wellness. What does preventive care include?  A yearly physical exam. This is also called an annual well check.  Dental exams once or twice a year.  Routine eye exams. Ask your health care provider how often you should have your eyes checked.  Personal lifestyle choices, including:  Daily care of your teeth and gums.  Regular physical activity.  Eating a healthy diet.  Avoiding tobacco and drug use.  Limiting alcohol use.  Practicing safe sex.  Taking low-dose aspirin every day.  Taking vitamin and mineral supplements as recommended by your health care provider. What happens during an annual well check? The services and screenings done by your health care provider during your annual  well check will depend on your age, overall health, lifestyle risk factors, and family history of disease. Counseling  Your health care provider may ask you questions about your:  Alcohol use.  Tobacco use.  Drug use.  Emotional well-being.  Home and relationship well-being.  Sexual activity.  Eating habits.  History of falls.  Memory and ability to understand (cognition).  Work and work Statistician.  Reproductive health. Screening  You may have the following tests or measurements:  Height, weight, and BMI.  Blood pressure.  Lipid and cholesterol levels. These may be checked every 5 years, or more frequently if you are over 39 years old.  Skin check.  Lung cancer screening. You may have this screening every year starting at age 30 if you have a 30-pack-year history of smoking and currently smoke or have quit within the past 15 years.  Fecal occult blood test (FOBT) of the stool. You may have this test every year starting at age 68.  Flexible sigmoidoscopy or colonoscopy. You may have a sigmoidoscopy every 5 years or a colonoscopy every 10 years starting at age 58.  Hepatitis C blood test.  Hepatitis B blood test.  Sexually transmitted disease (STD) testing.  Diabetes screening. This is done by checking your blood sugar (glucose) after you have not eaten for a while (fasting). You may have this done every 1-3 years.  Bone density scan. This is done to screen for osteoporosis. You may have this done starting at age 23.  Mammogram. This may be done every 1-2 years. Talk to your health care provider about how often you should have regular mammograms. Talk with your health care provider about your test results, treatment options,  and if necessary, the need for more tests. Vaccines  Your health care provider may recommend certain vaccines, such as:  Influenza vaccine. This is recommended every year.  Tetanus, diphtheria, and acellular pertussis (Tdap, Td) vaccine.  You may need a Td booster every 10 years.  Zoster vaccine. You may need this after age 10.  Pneumococcal 13-valent conjugate (PCV13) vaccine. One dose is recommended after age 4.  Pneumococcal polysaccharide (PPSV23) vaccine. One dose is recommended after age 58. Talk to your health care provider about which screenings and vaccines you need and how often you need them. This information is not intended to replace advice given to you by your health care provider. Make sure you discuss any questions you have with your health care provider. Document Released: 02/05/2015 Document Revised: 09/29/2015 Document Reviewed: 11/10/2014 Elsevier Interactive Patient Education  2017 [place] Prevention in the Home Falls can cause injuries. They can happen to people of all ages. There are many things you can do to make your home safe and to help prevent falls. What can I do on the outside of my home?  Regularly fix the edges of walkways and driveways and fix any cracks.  Remove anything that might make you trip as you walk through a door, such as a raised step or threshold.  Trim any bushes or trees on the path to your home.  Use bright outdoor lighting.  Clear any walking paths of anything that might make someone trip, such as rocks or tools.  Regularly check to see if handrails are loose or broken. Make sure that both sides of any steps have handrails.  Any raised decks and porches should have guardrails on the edges.  Have any leaves, snow, or ice cleared regularly.  Use sand or salt on walking paths during winter.  Clean up any spills in your garage right away. This includes oil or grease spills. What can I do in the bathroom?  Use night lights.  Install grab bars by the toilet and in the tub and shower. Do not use towel bars as grab bars.  Use non-skid mats or decals in the tub or shower.  If you need to sit down in the shower, use a plastic, non-slip stool.  Keep the  floor dry. Clean up any water that spills on the floor as soon as it happens.  Remove soap buildup in the tub or shower regularly.  Attach bath mats securely with double-sided non-slip rug tape.  Do not have throw rugs and other things on the floor that can make you trip. What can I do in the bedroom?  Use night lights.  Make sure that you have a light by your bed that is easy to reach.  Do not use any sheets or blankets that are too big for your bed. They should not hang down onto the floor.  Have a firm chair that has side arms. You can use this for support while you get dressed.  Do not have throw rugs and other things on the floor that can make you trip. What can I do in the kitchen?  Clean up any spills right away.  Avoid walking on wet floors.  Keep items that you use a lot in easy-to-reach places.  If you need to reach something above you, use a strong step stool that has a grab bar.  Keep electrical cords out of the way.  Do not use floor polish or wax that makes floors slippery. If  you must use wax, use non-skid floor wax.  Do not have throw rugs and other things on the floor that can make you trip. What can I do with my stairs?  Do not leave any items on the stairs.  Make sure that there are handrails on both sides of the stairs and use them. Fix handrails that are broken or loose. Make sure that handrails are as long as the stairways.  Check any carpeting to make sure that it is firmly attached to the stairs. Fix any carpet that is loose or worn.  Avoid having throw rugs at the top or bottom of the stairs. If you do have throw rugs, attach them to the floor with carpet tape.  Make sure that you have a light switch at the top of the stairs and the bottom of the stairs. If you do not have them, ask someone to add them for you. What else can I do to help prevent falls?  Wear shoes that:  Do not have high heels.  Have rubber bottoms.  Are comfortable and fit  you well.  Are closed at the toe. Do not wear sandals.  If you use a stepladder:  Make sure that it is fully opened. Do not climb a closed stepladder.  Make sure that both sides of the stepladder are locked into place.  Ask someone to hold it for you, if possible.  Clearly mark and make sure that you can see:  Any grab bars or handrails.  First and last steps.  Where the edge of each step is.  Use tools that help you move around (mobility aids) if they are needed. These include:  Canes.  Walkers.  Scooters.  Crutches.  Turn on the lights when you go into a dark area. Replace any light bulbs as soon as they burn out.  Set up your furniture so you have a clear path. Avoid moving your furniture around.  If any of your floors are uneven, fix them.  If there are any pets around you, be aware of where they are.  Review your medicines with your doctor. Some medicines can make you feel dizzy. This can increase your chance of falling. Ask your doctor what other things that you can do to help prevent falls. This information is not intended to replace advice given to you by your health care provider. Make sure you discuss any questions you have with your health care provider. Document Released: 11/05/2008 Document Revised: 06/17/2015 Document Reviewed: 02/13/2014 Elsevier Interactive Patient Education  2017 [employer].

## 2019-08-15 ENCOUNTER — Encounter: Payer: Self-pay | Admitting: Family

## 2019-08-15 ENCOUNTER — Ambulatory Visit (INDEPENDENT_AMBULATORY_CARE_PROVIDER_SITE_OTHER): Payer: Medicare Other | Admitting: Family

## 2019-08-15 ENCOUNTER — Other Ambulatory Visit: Payer: Self-pay

## 2019-08-15 VITALS — BP 132/73 | HR 73 | Temp 98.4°F | Resp 16 | Ht 67.5 in | Wt 135.8 lb

## 2019-08-15 DIAGNOSIS — I10 Essential (primary) hypertension: Secondary | ICD-10-CM

## 2019-08-15 DIAGNOSIS — F419 Anxiety disorder, unspecified: Secondary | ICD-10-CM | POA: Diagnosis not present

## 2019-08-15 DIAGNOSIS — R251 Tremor, unspecified: Secondary | ICD-10-CM

## 2019-08-15 LAB — TSH: TSH: 1.15 u[IU]/mL (ref 0.35–4.50)

## 2019-08-15 MED ORDER — ESCITALOPRAM OXALATE 10 MG PO TABS
10.0000 mg | ORAL_TABLET | Freq: Every day | ORAL | 1 refills | Status: DC
Start: 2019-08-15 — End: 2019-09-10

## 2019-08-15 NOTE — Patient Instructions (Addendum)
Increase lexapro to 10mg  once daily.  Complete lab work prior to leaving.

## 2019-08-15 NOTE — Progress Notes (Signed)
Subjective:    Patient ID: Megan Middleton, female    DOB: 1955/12/14, 64 y.o.   MRN: [id number]  HPI  Patient is a 64 yr old female who presents today for follow up.  Anxiety- last visit she noticed increased anxiety symptoms. We gave her a trial of lexapro 5mg . Reports feeling "somewat better but not the way she used to feel."  Reports that she still feels anxious. When she gets anxious, sometimes her heart will race as well. Feels that that the last 9 months has been very difficult for her.  A close friend of hers died last week after a long illness.  She reports that she feels like she is shaky all over and wonders if this is due to anxiety or something else like MS or PD.    HTN- last visit we stopped aldactone due to muscle cramping. Amlodipine was increased from 2.5 to 5mg  once daily. She has had no further cramping and ABI was normal.   BP Readings from Last 3 Encounters:  08/15/19 (!) 132/73  08/13/19 125/72  07/25/19 135/82   Review of Systems    see HPI  Past Medical History:  Diagnosis Date  . Allergy    seasonal  . Gilberts syndrome   . Gynecological examination    sees Dr. [name]   . Hyperlipidemia   . Hypertension   . Squamous cell carcinoma    Sees Derm [name]  . Tubular adenoma of colon 2015     Social History   Socioeconomic History  . Marital status: Widowed    Spouse name: Not on file  . Number of children: Not on file  . Years of education: Not on file  . Highest education level: Not on file  Occupational History  . Not on file  Tobacco Use  . Smoking status: Never Smoker  . Smokeless tobacco: Never Used  Vaping Use  . Vaping Use: Never assessed  Substance and Sexual Activity  . Alcohol use: Yes    Alcohol/week: 0.0 standard drinks    Comment: occ / social  . Drug use: No  . Sexual activity: Not Currently  Other Topics Concern  . 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 Difficulty of Paying Living Expenses: Not hard at all  Food Insecurity: No Food Insecurity  . Worried About Charity fundraiser in the Last Year: Never true  . Ran Out of Food in the Last Year: Never true  Transportation Needs: No Transportation Needs  . Lack of Transportation (Medical): No  . Lack of Transportation (Non-Medical): No  Physical Activity:   . Days of Exercise per Week:   . Minutes of Exercise per Session:   Stress:   . Feeling of Stress :   Social Connections:   . Frequency of Communication with Friends and Family:   . Frequency of Social Gatherings with Friends and Family:   . Attends Religious Services:   . Active Member of Clubs or Organizations:   . Attends Archivist Meetings:   Marland Kitchen Marital Status:   Intimate Partner Violence:   . Fear of Current or Ex-Partner:   . Emotionally  Abused:   Marland Kitchen Physically Abused:   . Sexually Abused:     Past Surgical History:  Procedure Laterality Date  . collar bone surgery     jan,2020  . COLONOSCOPY  10-08-13   per Dr. Fuller Plan, adenomatous polyps, repear in 5 yrs  . TUBAL LIGATION      Family History  Problem Relation Age of Onset  . Hypertension Father   . Breast cancer Sister   . Lymphoma Mother   . Leukemia Mother   . Colon cancer Neg Hx   . Esophageal cancer Neg Hx   . Rectal cancer Neg Hx   . Stomach cancer Neg Hx   . Colon polyps Neg Hx     Allergies  Allergen Reactions  . Doxycycline     GI side effects  . Morphine Other (See Comments) and Nausea Only    Low blood pressure    Current Outpatient Medications on File Prior to Visit  Medication Sig Dispense Refill  . amLODipine (NORVASC) 5 MG tablet Take 1 tablet (5 mg total) by mouth daily. 30 tablet  3  . calcium carbonate (OS-CAL) 600 MG TABS Take 600 mg by mouth daily. With vitamin d    . escitalopram (LEXAPRO) 5 MG tablet Take 1 tablet (5 mg total) by mouth daily. 30 tablet 5  . glycopyrrolate (ROBINUL) 2 MG tablet Take 1 tablet (2 mg total) by mouth 2 (two) times daily. 60 tablet 11  . NON FORMULARY JUICE PLUS     Current Facility-Administered Medications on File Prior to Visit  Medication Dose Route Frequency Provider Last Rate Last Admin  . 0.9 %  sodium chloride infusion  500 mL Intravenous Once Ladene Artist, MD        BP (!) 132/73 (BP Location: Right Arm, Patient Position: Sitting, Cuff Size: Small)   Pulse 73   Temp 98.4 F (36.9 C) (Oral)   Resp 16   Ht 5' 7.5" (1.715 m)   Wt 135 lb 12.8 oz (61.6 kg)   SpO2 100%   BMI 20.96 kg/m    Objective:   Physical Exam Constitutional:      Appearance: She is well-developed.  Neck:     Thyroid: No thyromegaly.  Cardiovascular:     Rate and Rhythm: Normal rate and regular rhythm.     Heart sounds: Normal heart sounds. No murmur heard.   Pulmonary:     Effort: Pulmonary effort is normal. No respiratory distress.     Breath sounds: Normal breath sounds. No wheezing.  Musculoskeletal:     Cervical back: Neck supple.  Skin:    General: Skin is warm and dry.  Neurological:     Mental Status: She is alert and oriented to person, place, and time.     Cranial Nerves: No cranial nerve deficit.     Motor: Tremor (very fine resting hand tremor bilaterally) present. No weakness.  Psychiatric:        Behavior: Behavior normal.        Thought Content: Thought content normal.        Judgment: Judgment normal.           Assessment & Plan:  HTN- bp stable on amlodipine. Continue same.  Anxiety- uncontrolled. Will increase lexapro from 5mg  to 10mg . I will also check a TSH to rule out hyperthyroid.   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 Will monitor for now. If symptoms worsen, consider  referral to neurology.  This visit occurred during the SARS-CoV-2 public health emergency.  Safety protocols were in place, including screening questions prior to the visit, additional usage of staff PPE, and extensive cleaning of exam room while observing appropriate contact time as indicated for disinfecting solutions.

## 2019-08-19 DIAGNOSIS — L304 Erythema intertrigo: Secondary | ICD-10-CM | POA: Diagnosis not present

## 2019-08-19 DIAGNOSIS — L821 Other seborrheic keratosis: Secondary | ICD-10-CM | POA: Diagnosis not present

## 2019-08-19 DIAGNOSIS — L570 Actinic keratosis: Secondary | ICD-10-CM | POA: Diagnosis not present

## 2019-08-19 DIAGNOSIS — D485 Neoplasm of uncertain behavior of skin: Secondary | ICD-10-CM | POA: Diagnosis not present

## 2019-08-19 DIAGNOSIS — D2339 Other benign neoplasm of skin of other parts of face: Secondary | ICD-10-CM | POA: Diagnosis not present

## 2019-08-29 ENCOUNTER — Encounter (HOSPITAL_BASED_OUTPATIENT_CLINIC_OR_DEPARTMENT_OTHER): Payer: Medicare Other

## 2019-09-08 ENCOUNTER — Other Ambulatory Visit: Payer: Self-pay | Admitting: Family

## 2019-09-18 DIAGNOSIS — L570 Actinic keratosis: Secondary | ICD-10-CM | POA: Diagnosis not present

## 2019-09-24 ENCOUNTER — Encounter: Payer: Self-pay | Admitting: Family

## 2019-09-24 ENCOUNTER — Other Ambulatory Visit: Payer: Self-pay

## 2019-09-24 ENCOUNTER — Ambulatory Visit (INDEPENDENT_AMBULATORY_CARE_PROVIDER_SITE_OTHER): Payer: Medicare Other | Admitting: Family

## 2019-09-24 VITALS — BP 108/70 | HR 74 | Resp 16 | Ht 67.5 in | Wt 135.0 lb

## 2019-09-24 DIAGNOSIS — F419 Anxiety disorder, unspecified: Secondary | ICD-10-CM

## 2019-09-24 MED ORDER — ESCITALOPRAM OXALATE 10 MG PO TABS: 15.0000 mg | ORAL_TABLET | Freq: Every day | ORAL | 1 refills | Status: DC

## 2019-09-24 NOTE — Patient Instructions (Signed)
Please increase lexapro to 15mg  once daily.

## 2019-09-24 NOTE — Progress Notes (Signed)
Subjective:    Patient ID: Megan Middleton, female    DOB: 1955/05/14, 64 y.o.   MRN: [id number]  HPI  Patient is a 64 yr old female who presents today for follow up.  Anxiety- last visit she noted increased anxiety symptoms. We increased her lexapro from 5mg  to 10mg . TSH was WNL. Reports that she feels 95% better since she started lexapro.  Feels like she is trembling on the inside.  No external tremor.    Review of Systems See HPI  Past Medical History:  Diagnosis Date  . Allergy    seasonal  . Gilberts syndrome   . Gynecological examination    sees Dr. [name]   . Hyperlipidemia   . Hypertension   . Squamous cell carcinoma    Sees Derm [name]  . Tubular adenoma of colon 2015     Social History   Socioeconomic History  . Marital status: Widowed    Spouse name: Not on file  . Number of children: Not on file  . Years of education: Not on file  . Highest education level: Not on file  Occupational History  . Not on file  Tobacco Use  . Smoking status: Never Smoker  . Smokeless tobacco: Never Used  Vaping Use  . Vaping Use: Never assessed  Substance and Sexual Activity  . Alcohol use: Yes    Alcohol/week: 0.0 standard drinks    Comment: occ / social  . Drug use: No  . Sexual activity: Not Currently  Other Topics Concern  . 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 Difficulty of Paying Living Expenses: Not hard at all  Food Insecurity: No Food Insecurity  . Worried About Charity fundraiser in the Last Year: Never true  . Ran Out of Food in the Last Year: Never true  Transportation Needs: No Transportation Needs  . Lack of Transportation (Medical): No  .  Lack of Transportation (Non-Medical): No  Physical Activity:   . Days of Exercise per Week: Not on file  . Minutes of Exercise per Session: Not on file  Stress:   . Feeling of Stress : Not on file  Social Connections:   . Frequency of Communication with Friends and Family: Not on file  . Frequency of Social Gatherings with Friends and Family: Not on file  . Attends Religious Services: Not on file  . Active Member of Clubs or Organizations: Not on file  . Attends Archivist Meetings: Not on file  . Marital Status: Not on file  Intimate Partner Violence:   . Fear of Current or Ex-Partner: Not on file  . Emotionally Abused: Not on file  . Physically Abused: Not on file  . Sexually Abused: Not on file    Past Surgical History:  Procedure Laterality Date  . collar bone surgery     jan,2020  . COLONOSCOPY  10-08-13   per Dr. Fuller Plan, adenomatous polyps, repear in 5 yrs  . TUBAL LIGATION      Family History  Problem Relation Age of Onset  . Hypertension Father   . Breast cancer Sister   . Lymphoma Mother   .  Leukemia Mother   . Colon cancer Neg Hx   . Esophageal cancer Neg Hx   . Rectal cancer Neg Hx   . Stomach cancer Neg Hx   . Colon polyps Neg Hx     Allergies  Allergen Reactions  . Doxycycline     GI side effects  . Morphine Other (See Comments) and Nausea Only    Low blood pressure    Current Outpatient Medications on File Prior to Visit  Medication Sig Dispense Refill  . amLODipine (NORVASC) 5 MG tablet Take 1 tablet (5 mg total) by mouth daily. 30 tablet 3  . calcium carbonate (OS-CAL) 600 MG TABS Take 600 mg by mouth daily. With vitamin d    . glycopyrrolate (ROBINUL) 2 MG tablet Take 1 tablet (2 mg total) by mouth 2 (two) times daily. 60 tablet 11  . NON FORMULARY JUICE PLUS     Current Facility-Administered Medications on File Prior to Visit  Medication Dose Route Frequency Provider Last Rate Last Admin  . 0.9 %  sodium chloride infusion  500 mL  Intravenous Once Ladene Artist, MD        BP 108/70 (BP Location: Left Arm, Patient Position: Sitting, Cuff Size: Normal)   Pulse 74   Resp 16   Ht 5' 7.5" (1.715 m)   Wt 135 lb (61.2 kg)   SpO2 98%   BMI 20.83 kg/m       Objective:   Physical Exam Constitutional:      Appearance: She is well-developed.  Neck:     Thyroid: No thyromegaly.  Cardiovascular:     Rate and Rhythm: Normal rate and regular rhythm.     Heart sounds: Normal heart sounds. No murmur heard.   Pulmonary:     Effort: Pulmonary effort is normal. No respiratory distress.     Breath sounds: Normal breath sounds. No wheezing.  Musculoskeletal:     Cervical back: Neck supple.  Skin:    General: Skin is warm and dry.  Neurological:     Mental Status: She is alert and oriented to person, place, and time.     Comments: No tremor noted  Psychiatric:        Behavior: Behavior normal.        Thought Content: Thought content normal.        Judgment: Judgment normal.           Assessment & Plan:  Anxiety- improved with lexapro 10mg .  Still having some internal "tremor" symptoms.  Will try increasing to 15mg  once daily to see if that helps.  This visit occurred during the SARS-CoV-2 public health emergency.  Safety protocols were in place, including screening questions prior to the visit, additional usage of staff PPE, and extensive cleaning of exam room while observing appropriate contact time as indicated for disinfecting solutions.

## 2019-10-07 ENCOUNTER — Other Ambulatory Visit: Payer: Self-pay

## 2019-10-07 ENCOUNTER — Encounter: Payer: Self-pay | Admitting: Family

## 2019-10-07 ENCOUNTER — Ambulatory Visit (INDEPENDENT_AMBULATORY_CARE_PROVIDER_SITE_OTHER): Payer: Medicare Other | Admitting: Family

## 2019-10-07 VITALS — BP 129/65 | HR 72 | Temp 98.5°F | Resp 16 | Ht 67.5 in | Wt 132.6 lb

## 2019-10-07 DIAGNOSIS — R739 Hyperglycemia, unspecified: Secondary | ICD-10-CM | POA: Diagnosis not present

## 2019-10-07 DIAGNOSIS — F419 Anxiety disorder, unspecified: Secondary | ICD-10-CM

## 2019-10-07 DIAGNOSIS — R251 Tremor, unspecified: Secondary | ICD-10-CM | POA: Diagnosis not present

## 2019-10-07 DIAGNOSIS — Z23 Encounter for immunization: Secondary | ICD-10-CM

## 2019-10-07 MED ORDER — ESCITALOPRAM OXALATE 10 MG PO TABS: 10.0000 mg | ORAL_TABLET | Freq: Every day | ORAL | 1 refills | Status: DC

## 2019-10-07 NOTE — Patient Instructions (Signed)
Please complete lab work prior to leaving. You should be contacted about your referral to neurology. Continue lexapro 10mg  once daily.

## 2019-10-07 NOTE — Progress Notes (Signed)
Subjective:    Patient ID: Megan Middleton, female    DOB: [birth year], 64 y.o.   MRN: [id number]  HPI   Patient is a 64 yr old female who presents today for follow up.  Anxiety- last visit she noted significant improvement in her anxiety since she was started on lexapro 10mg .  She noted that she continued to have a feeling of tremor on the "inside."  We tried increasing the lexapro to 15 mg to see if this would help. She notes that she did not have any improvement in these feelings after several weeks at this dose. She did note that she was more tired in the afternoons however, so she returned to the 10mg  dosing.  She remains concerned over her feeling of "shakiness" in her legs. She is worried about possible underlying neurological disorder such as ALS, MS or Parkinsons.  Lab Results  Component Value Date   HGBA1C 5.2 04/03/2016     Review of Systems See HPI  Past Medical History:  Diagnosis Date   Allergy    seasonal   Gilberts syndrome    Gynecological examination    sees Dr. [name]    Hyperlipidemia    Hypertension    Squamous cell carcinoma    Sees Derm [name]   Tubular adenoma of colon 2015     Social History   Socioeconomic History   Marital status: Widowed    Spouse name: Not on file   Number of children: Not on file   Years of education: Not on file   Highest education level: Not on file  Occupational History   Not on file  Tobacco Use   Smoking status: Never Smoker   Smokeless tobacco: Never Used  Vaping Use   Vaping Use: Never assessed  Substance and Sexual Activity   Alcohol use: Yes    Alcohol/week: 0.0 standard drinks    Comment: occ / social   Drug use: No   Sexual activity: Not Currently  Other Topics Concern   Not on file  Social History Narrative   Originally from [place]   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[name]   Completed 2 year community college   Has one daughter- [name]     2 grandchildren   Enjoys volunteering, GYM, crafts, reading   Social Determinants of Radio broadcast assistant Strain: Low Risk    Difficulty of Paying Living Expenses: Not hard at [employer] Insecurity: No Food Insecurity   Worried About Charity fundraiser in the Last Year: Never true   Arboriculturist in the Last Year: Never true  Transportation Needs: No Transportation Needs   Lack of Transportation (Medical): No   Lack of Transportation (Non-Medical): No  Physical Activity:    Days of Exercise per Week: Not on file   Minutes of Exercise per Session: Not on file  Stress:    Feeling of Stress : Not on file  Social Connections:    Frequency of Communication with Friends and Family: Not on file   Frequency of Social Gatherings with Friends and Family: Not on file   Attends Religious Services: Not on file   Active Member of Clubs or Organizations: Not on file   Attends Archivist Meetings: Not on file   Marital Status: Not on file  Intimate Partner Violence:    Fear of Current or Ex-Partner: Not on file   Emotionally Abused: Not on file  Physically Abused: Not on file   Sexually Abused: Not on file    Past Surgical History:  Procedure Laterality Date   collar bone surgery     jan,2020   COLONOSCOPY  10-08-13   per Dr. Fuller Plan, adenomatous polyps, repear in 5 yrs   TUBAL LIGATION      Family History  Problem Relation Age of Onset   Hypertension Father    Breast cancer Sister    Lymphoma Mother    Leukemia Mother    Colon cancer Neg Hx    Esophageal cancer Neg Hx    Rectal cancer Neg Hx    Stomach cancer Neg Hx    Colon polyps Neg Hx     Allergies  Allergen Reactions   Doxycycline     GI side effects   Morphine Other (See Comments) and Nausea Only    Low blood pressure    Current Outpatient Medications on File Prior to Visit  Medication Sig Dispense Refill   amLODipine (NORVASC) 5 MG tablet Take 1 tablet (5 mg  total) by mouth daily. 30 tablet 3   calcium carbonate (OS-CAL) 600 MG TABS Take 600 mg by mouth daily. With vitamin d     glycopyrrolate (ROBINUL) 2 MG tablet Take 1 tablet (2 mg total) by mouth 2 (two) times daily. 60 tablet 11   NON FORMULARY JUICE PLUS     Current Facility-Administered Medications on File Prior to Visit  Medication Dose Route Frequency Provider Last Rate Last Admin   0.9 %  sodium chloride infusion  500 mL Intravenous Once Ladene Artist, MD        BP 129/65 (BP Location: Right Arm, Patient Position: Sitting, Cuff Size: Small)    Pulse 72    Temp 98.5 F (36.9 C) (Oral)    Resp 16    Ht 5' 7.5" (1.715 m)    Wt 132 lb 9.6 oz (60.1 kg)    SpO2 100%    BMI 20.46 kg/m       Objective:   Physical Exam Constitutional:      Appearance: She is well-developed.  Neck:     Thyroid: No thyromegaly.  Cardiovascular:     Rate and Rhythm: Normal rate and regular rhythm.     Heart sounds: Normal heart sounds. No murmur heard.   Pulmonary:     Effort: Pulmonary effort is normal. No respiratory distress.     Breath sounds: Normal breath sounds. No wheezing.  Musculoskeletal:     Cervical back: Neck supple.  Skin:    General: Skin is warm and dry.  Neurological:     Mental Status: She is alert and oriented to person, place, and time.     Cranial Nerves: Cranial nerves are intact. No cranial nerve deficit or facial asymmetry.     Deep Tendon Reflexes:     Reflex Scores:      Bicep reflexes are 2+ on the right side and 2+ on the left side.      Patellar reflexes are 2+ on the right side and 2+ on the left side.    Comments: Very mild hand tremor noted bilaterally.   Psychiatric:        Behavior: Behavior normal.        Thought Content: Thought content normal.        Judgment: Judgment normal.           Assessment & Plan:  Anxiety- overall improved, but I still think that her anxiety  may be contributing to some her tremor symptoms.  I advised pt to continue  lexapro at 10mg  since she did not see benefit on the 15mg  dose.   Tremor- neuro exam is normal.  Will refer to neurology for further evaluation. She is concerned about the possibility of sugar contributing to her weakness. Will obtain A1C.    This visit occurred during the SARS-CoV-2 public health emergency.  Safety protocols were in place, including screening questions prior to the visit, additional usage of staff PPE, and extensive cleaning of exam room while observing appropriate contact time as indicated for disinfecting solutions.

## 2019-10-08 ENCOUNTER — Encounter: Payer: Self-pay | Admitting: Family

## 2019-10-08 LAB — HEMOGLOBIN A1C
Hgb A1c MFr Bld: 5.3 % of total Hgb (ref <5.7)
Mean Plasma Glucose: 105 (calc)
eAG (mmol/L): 5.8 (calc)

## 2019-10-13 DIAGNOSIS — Z01419 Encounter for gynecological examination (general) (routine) without abnormal findings: Secondary | ICD-10-CM | POA: Diagnosis not present

## 2019-10-31 ENCOUNTER — Other Ambulatory Visit: Payer: Self-pay | Admitting: Family

## 2019-11-12 DIAGNOSIS — S81832A Puncture wound without foreign body, left lower leg, initial encounter: Secondary | ICD-10-CM | POA: Diagnosis not present

## 2019-11-12 DIAGNOSIS — Z203 Contact with and (suspected) exposure to rabies: Secondary | ICD-10-CM | POA: Diagnosis not present

## 2019-11-12 DIAGNOSIS — Y998 Other external cause status: Secondary | ICD-10-CM | POA: Diagnosis not present

## 2019-11-12 DIAGNOSIS — Z23 Encounter for immunization: Secondary | ICD-10-CM | POA: Diagnosis not present

## 2019-11-12 DIAGNOSIS — W540XXA Bitten by dog, initial encounter: Secondary | ICD-10-CM | POA: Diagnosis not present

## 2019-11-12 DIAGNOSIS — Z2914 Encounter for prophylactic rabies immune globin: Secondary | ICD-10-CM | POA: Diagnosis not present

## 2019-11-12 DIAGNOSIS — S81852A Open bite, left lower leg, initial encounter: Secondary | ICD-10-CM | POA: Diagnosis not present

## 2019-11-15 DIAGNOSIS — Z203 Contact with and (suspected) exposure to rabies: Secondary | ICD-10-CM | POA: Diagnosis not present

## 2019-11-15 DIAGNOSIS — Z23 Encounter for immunization: Secondary | ICD-10-CM | POA: Diagnosis not present

## 2019-11-15 DIAGNOSIS — S80872D Other superficial bite, left lower leg, subsequent encounter: Secondary | ICD-10-CM | POA: Diagnosis not present

## 2019-11-19 DIAGNOSIS — Z23 Encounter for immunization: Secondary | ICD-10-CM | POA: Diagnosis not present

## 2019-11-19 DIAGNOSIS — S80872D Other superficial bite, left lower leg, subsequent encounter: Secondary | ICD-10-CM | POA: Diagnosis not present

## 2019-11-19 DIAGNOSIS — Z203 Contact with and (suspected) exposure to rabies: Secondary | ICD-10-CM | POA: Diagnosis not present

## 2019-11-24 ENCOUNTER — Encounter: Payer: Self-pay | Admitting: Family

## 2019-11-24 ENCOUNTER — Ambulatory Visit (INDEPENDENT_AMBULATORY_CARE_PROVIDER_SITE_OTHER): Payer: Medicare Other | Admitting: Family

## 2019-11-24 ENCOUNTER — Other Ambulatory Visit: Payer: Self-pay

## 2019-11-24 VITALS — BP 110/68 | HR 74 | Resp 16 | Ht 67.5 in | Wt 134.0 lb

## 2019-11-24 DIAGNOSIS — T148XXA Other injury of unspecified body region, initial encounter: Secondary | ICD-10-CM | POA: Diagnosis not present

## 2019-11-24 DIAGNOSIS — L821 Other seborrheic keratosis: Secondary | ICD-10-CM | POA: Diagnosis not present

## 2019-11-24 DIAGNOSIS — F419 Anxiety disorder, unspecified: Secondary | ICD-10-CM | POA: Diagnosis not present

## 2019-11-24 DIAGNOSIS — L820 Inflamed seborrheic keratosis: Secondary | ICD-10-CM | POA: Diagnosis not present

## 2019-11-24 DIAGNOSIS — L570 Actinic keratosis: Secondary | ICD-10-CM | POA: Diagnosis not present

## 2019-11-24 MED ORDER — ESCITALOPRAM OXALATE 20 MG PO TABS
20.0000 mg | ORAL_TABLET | Freq: Every day | ORAL | 0 refills | Status: DC
Start: 2019-11-24 — End: 2020-03-08

## 2019-11-24 NOTE — Progress Notes (Signed)
Subjective:    Patient ID: Megan Middleton, female    DOB: 1955-08-27, 64 y.o.   MRN: [id number]  HPI  Patient is a 64 yr old female who presents today for follow up.   Anxiety-  Pt retrialed the 15mg .  She notes some improvement in her anxiety.  She reports that she continues to have this feeling of trembling inside throughout the day.  She reports that she worries about it all day long.  The only time that she does not notice it is if she is actively busy such as cleaning her house.  She has an upcoming consultation with neurology on November 22.  Reports resolution of leg cramping since she was taken off of the diuretic.  Since our last visit, she was seen in the emergency department on October 20 due to a bite from a gray fox that attacked her outside her front door at 8 AM in the morning.  The animal was presumed to be rabid.  She has been taking the series of rabies injections.  Review of Systems See HPI  Past Medical History:  Diagnosis Date   Allergy    seasonal   Gilberts syndrome    Gynecological examination    sees Dr. [name]    Hyperlipidemia    Hypertension    Squamous cell carcinoma    Sees Derm [name]   Tubular adenoma of colon 2015     Social History   Socioeconomic History   Marital status: Widowed    Spouse name: Not on file   Number of children: Not on file   Years of education: Not on file   Highest education level: Not on file  Occupational History   Not on file  Tobacco Use   Smoking status: Never Smoker   Smokeless tobacco: Never Used  Vaping Use   Vaping Use: Never assessed  Substance and Sexual Activity   Alcohol use: Yes    Alcohol/week: 0.0 standard drinks    Comment: occ / social   Drug use: No   Sexual activity: Not Currently  Other Topics Concern   Not on file  Social History Narrative   Originally from [place]   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[name]   Completed 2 year  community college   Has one daughter- [name]   2 grandchildren   Enjoys volunteering, GYM, crafts, reading   Social Determinants of Radio broadcast assistant Strain: Low Risk    Difficulty of Paying Living Expenses: Not hard at [employer] Insecurity: No Food Insecurity   Worried About Charity fundraiser in the Last Year: Never true   Arboriculturist in the Last Year: Never true  Transportation Needs: No Transportation Needs   Lack of Transportation (Medical): No   Lack of Transportation (Non-Medical): No  Physical Activity:    Days of Exercise per Week: Not on file   Minutes of Exercise per Session: Not on file  Stress:    Feeling of Stress : Not on file  Social Connections:    Frequency of Communication with Friends and Family: Not on file   Frequency of Social Gatherings with Friends and Family: Not on file   Attends Religious Services: Not on file   Active Member of Clubs or Organizations: Not on file   Attends Archivist Meetings: Not on file   Marital Status: Not on file  Intimate Partner Violence:    Fear of  Current or Ex-Partner: Not on file   Emotionally Abused: Not on file   Physically Abused: Not on file   Sexually Abused: Not on file    Past Surgical History:  Procedure Laterality Date   collar bone surgery     jan,2020   COLONOSCOPY  10-08-13   per Dr. Fuller Plan, adenomatous polyps, repear in 5 yrs   TUBAL LIGATION      Family History  Problem Relation Age of Onset   Hypertension Father    Breast cancer Sister    Lymphoma Mother    Leukemia Mother    Colon cancer Neg Hx    Esophageal cancer Neg Hx    Rectal cancer Neg Hx    Stomach cancer Neg Hx    Colon polyps Neg Hx     Allergies  Allergen Reactions   Doxycycline     GI side effects   Morphine Other (See Comments) and Nausea Only    Low blood pressure    Current Outpatient Medications on File Prior to Visit  Medication Sig Dispense Refill     amLODipine (NORVASC) 5 MG tablet TAKE 1 TABLET BY MOUTH EVERY DAY 90 tablet 1   calcium carbonate (OS-CAL) 600 MG TABS Take 600 mg by mouth daily. With vitamin d     escitalopram (LEXAPRO) 10 MG tablet Take 1 tablet (10 mg total) by mouth daily. (Patient taking differently: Take 15 mg by mouth daily. ) 135 tablet 1   glycopyrrolate (ROBINUL) 2 MG tablet Take 1 tablet (2 mg total) by mouth 2 (two) times daily. 60 tablet 11   NON FORMULARY JUICE PLUS     Current Facility-Administered Medications on File Prior to Visit  Medication Dose Route Frequency Provider Last Rate Last Admin   0.9 %  sodium chloride infusion  500 mL Intravenous Once Ladene Artist, MD        BP 110/68 (BP Location: Left Arm, Patient Position: Sitting, Cuff Size: Normal)    Pulse 74    Resp 16    Ht 5' 7.5" (1.715 m)    Wt 134 lb (60.8 kg)    SpO2 99%    BMI 20.68 kg/m       Objective:   Physical Exam Constitutional:      Appearance: She is well-developed.  Cardiovascular:     Rate and Rhythm: Normal rate and regular rhythm.     Heart sounds: Normal heart sounds. No murmur heard.   Pulmonary:     Effort: Pulmonary effort is normal. No respiratory distress.     Breath sounds: Normal breath sounds. No wheezing.  Skin:    General: Skin is warm.  Neurological:     General: No focal deficit present.     Mental Status: She is oriented to person, place, and time.  Psychiatric:        Behavior: Behavior normal.        Thought Content: Thought content normal.        Judgment: Judgment normal.           Assessment & Plan:  Anxiety-I continue to suspect that anxiety is playing a role in her feeling of and "inside tremor."  I have encouraged her to keep her upcoming appointment with neurology to see if they have any further thoughts or suggestions.  In the meantime she inquired about increasing her Lexapro to 20 mg.  I think this is a reasonable plan.  We will increase to 20 mg once daily.  I  will plan to  see her back in 1 month.  Animal bite-patient is completing rabies series.  Clinically stable.  25 minutes spent on today's visit.  The majority of this time was counseling the patient.  This visit occurred during the SARS-CoV-2 public health emergency.  Safety protocols were in place, including screening questions prior to the visit, additional usage of staff PPE, and extensive cleaning of exam room while observing appropriate contact time as indicated for disinfecting solutions.

## 2019-11-24 NOTE — Patient Instructions (Signed)
Please increase your lexapro to 20mg . Keep your upcoming appointment with neurology.

## 2019-11-26 DIAGNOSIS — S80872D Other superficial bite, left lower leg, subsequent encounter: Secondary | ICD-10-CM | POA: Diagnosis not present

## 2019-11-26 DIAGNOSIS — Z23 Encounter for immunization: Secondary | ICD-10-CM | POA: Diagnosis not present

## 2019-11-26 DIAGNOSIS — Z203 Contact with and (suspected) exposure to rabies: Secondary | ICD-10-CM | POA: Diagnosis not present

## 2019-11-27 DIAGNOSIS — Z23 Encounter for immunization: Secondary | ICD-10-CM | POA: Diagnosis not present

## 2019-12-15 ENCOUNTER — Encounter: Payer: Self-pay | Admitting: Neurology

## 2019-12-15 ENCOUNTER — Other Ambulatory Visit: Payer: Self-pay

## 2019-12-15 ENCOUNTER — Ambulatory Visit (INDEPENDENT_AMBULATORY_CARE_PROVIDER_SITE_OTHER): Payer: Medicare Other | Admitting: Neurology

## 2019-12-15 ENCOUNTER — Telehealth: Payer: Self-pay | Admitting: Neurology

## 2019-12-15 VITALS — BP 139/80 | HR 85 | Ht 67.5 in | Wt 137.0 lb

## 2019-12-15 DIAGNOSIS — G20 Parkinson's disease: Secondary | ICD-10-CM | POA: Diagnosis not present

## 2019-12-15 DIAGNOSIS — R29898 Other symptoms and signs involving the musculoskeletal system: Secondary | ICD-10-CM

## 2019-12-15 DIAGNOSIS — G20A1 Parkinson's disease without dyskinesia, without mention of fluctuations: Secondary | ICD-10-CM

## 2019-12-15 DIAGNOSIS — R251 Tremor, unspecified: Secondary | ICD-10-CM | POA: Diagnosis not present

## 2019-12-15 DIAGNOSIS — R252 Cramp and spasm: Secondary | ICD-10-CM

## 2019-12-15 NOTE — Telephone Encounter (Signed)
medicare/bcbs supp order sent to GI. No auth they will reach out to the patient to schedule.  

## 2019-12-15 NOTE — Patient Instructions (Signed)
You have a rather mild and intermittent tremor of both hands.  I do not see any signs or symptoms of parkinson's like disease or what we call parkinsonism.   For your tremor, I would not recommend any new medication, as your symptoms are mild and your exam is benign.  We can make a follow up appointment in 6 months for a recheck.  We will check blood work today and call you with the test results. We will do a brain scan, called MRI and call you with the test results. We will have to schedule you for this on a separate date. This test requires authorization from your insurance, and we will take care of the insurance process.  Please remember, that any kind of tremor may be exacerbated by anxiety, anger, nervousness, excitement, dehydration, thyroid disease, sleep deprivation, by caffeine, and low blood sugar values or blood sugar fluctuations. Some medications can exacerbate tremors, this includes certain antidepressant medications including Lexapro. However, your tremor started before you start the Lexapro.

## 2019-12-15 NOTE — Progress Notes (Signed)
Subjective:    Patient ID: Megan Middleton is a 64 y.o. female.  HPI    Star Age, MD, PhD [facility] [address], [address]O. [place], [place] [address]  Dear Lenna Sciara,   I saw your patient, Megan Middleton, upon your kind request, in my Neurologic clinic today for initial consultation of her tremors.  The patient is unaccompanied today.  As you know, Megan Middleton is a 64 year old right-handed woman with an underlying medical history of seasonal allergies, hypertension, hyperlipidemia, squamous cell cancer, anxiety, who reports a several month history since approximately June 2021 of tremors affecting intermittently both hands when she holds something or an inner trembling that is not necessarily visible from the outside.  She has had no significant progression in the tremor, comes and goes, sometimes she notices the tremor when she tries to do fine motor tasks such as putting on make-up.  She has not had any significant one-sided symptoms.  She certainly did not have any sudden onset of one-sided weakness or numbness or tingling or droopy face or slurring of speech.  She does admit that she has had increase in stress in the past year or 2.  In January 2020 she had an accident and broke her left clavicle, required surgery and physical therapy.  She has some residual paresthesias around the surgical site but no significant pain.  She had an episode of severe bilateral calf cramping in June.  She was in the pool at the time and had spent an extended period of time in the pool and the cramping was so severe that it took her about 30 minutes to walk it out.  She felt it took her at least a month to feel more normal in her legs again.  She had weakness in both legs but does not currently have any specific weakness, no weight loss or appetite loss, no twitching or involuntary movements.  About a month ago she was attacked by a fox in her front yard, she sustained at least 1  bite to her left leg below the knee and required rabies shots and tetanus shots and was quite shaken afterwards to the point that she does not walk outside anymore.  She now walks within the confines of the gym.  She was recently taken off the diuretic that she was on for blood pressure control, she is still on amlodipine.  She was started on Lexapro in June for anxiety and stress and it was built up from 5 mg daily to up to 20 mg daily.    I reviewed your office note from 10/07/2019.  She had a TSH checked on 08/15/2019, it was normal at 1.15.  Her A1c was normal at 5.3 on 10/07/2019.  She has been on Lexapro for anxiety which has helped.  She denies a family history of Parkinson's disease or essential tremor.  She does worry about having some significant tremor condition such as Parkinson's disease.  Her sister's husband has Parkinson's disease and she is worried about it.  She drinks caffeine in the form of coffee, 2 to 3 cups/day and has reduced in the recent past.  She drinks sweet tea occasionally.  She tries to hydrate well with water.  She lost her husband about 4-1/2 years ago and started dating someone about 2 years after her husband passed away and her boyfriend sadly passed away in 08-31-2022.  She has had leg cramping for years, as far back as her teenage years,  her father has cramping and her sister also suffers from cramps.  She only has 1 sibling, sister is 26 years old.  She is physically active, she has maintained her weight, she moved to [place] from [place] in 2012.  Her mom lived to be 67 and died from [place] and leukemia, father lived to be 44, was a paraplegic after car accident since age 33.  Her Past Medical History Is Significant For: Past Medical History:  Diagnosis Date  . Allergy    seasonal  . Gilberts syndrome   . Gynecological examination    sees Dr. [name]   . Hyperlipidemia   . Hypertension   . Squamous cell carcinoma    Sees Derm [name]  . Tubular  adenoma of colon 2015    Her Past Surgical History Is Significant For: Past Surgical History:  Procedure Laterality Date  . collar bone surgery     jan,2020  . COLONOSCOPY  10-08-13   per Dr. Fuller Plan, adenomatous polyps, repear in 5 yrs  . TUBAL LIGATION      Her Family History Is Significant For: Family History  Problem Relation Age of Onset  . Hypertension Father   . Breast cancer Sister   . Lymphoma Mother   . Leukemia Mother   . Colon cancer Neg Hx   . Esophageal cancer Neg Hx   . Rectal cancer Neg Hx   . Stomach cancer Neg Hx   . Colon polyps Neg Hx     Her Social History Is Significant For: Social History   Socioeconomic History  . Marital status: Widowed    Spouse name: Not on file  . Number of children: Not on file  . Years of education: Not on file  . Highest education level: Not on file  Occupational History  . Not on file  Tobacco Use  . Smoking status: Never Smoker  . Smokeless tobacco: Never Used  Vaping Use  . Vaping Use: Never assessed  Substance and Sexual Activity  . Alcohol use: Yes    Alcohol/week: 0.0 standard drinks    Comment: occ / social  . Drug use: No  . Sexual activity: Not Currently  Other Topics Concern  . 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 Difficulty of Paying Living Expenses: Not hard at all  Food Insecurity: No Food Insecurity  . Worried About Charity fundraiser in the Last Year: Never true  . Ran Out of Food in the Last Year: Never true  Transportation Needs: No Transportation Needs  . Lack of Transportation (Medical): No  . Lack of Transportation (Non-Medical): No  Physical Activity:   . Days of Exercise per Week: Not on file  .  Minutes of Exercise per Session: Not on file  Stress:   . Feeling of Stress : Not on file  Social Connections:   . Frequency of Communication with Friends and Family: Not on file  . Frequency of Social Gatherings with Friends and Family: Not on file  . Attends Religious Services: Not on file  . Active Member of Clubs or Organizations: Not on file  . Attends Archivist Meetings: Not on file  .  Marital Status: Not on file    Her Allergies Are:  Allergies  Allergen Reactions  . Doxycycline     GI side effects  . Morphine Other (See Comments) and Nausea Only    Low blood pressure  :   Her Current Medications Are:  Outpatient Encounter Medications as of 12/15/2019  Medication Sig  . amLODipine (NORVASC) 5 MG tablet TAKE 1 TABLET BY MOUTH EVERY DAY  . calcium carbonate (OS-CAL) 600 MG TABS Take 600 mg by mouth daily. With vitamin d  . escitalopram (LEXAPRO) 20 MG tablet Take 1 tablet (20 mg total) by mouth daily.  . NON FORMULARY JUICE PLUS  . [DISCONTINUED] glycopyrrolate (ROBINUL) 2 MG tablet Take 1 tablet (2 mg total) by mouth 2 (two) times daily.   Facility-Administered Encounter Medications as of 12/15/2019  Medication  . 0.9 %  sodium chloride infusion  :   Review of Systems:  Out of a complete 14 point review of systems, all are reviewed and negative with the exception of these symptoms as listed below:  Review of Systems  Neurological:       Pt presents today to discuss her tremors. Pt had a spell of muscle cramping and perhaps a panic attack in June. Pt was placed on lexapro which helps but she still has internal tremors that occasionally manifest in her hands. Pt is right handed.    Objective:  Neurological Exam  Physical Exam Physical Examination:   Vitals:   12/15/19 0952  BP: 139/80  Pulse: 85    General Examination: The patient is a very pleasant 64 y.o. female in no acute distress. She appears well-developed and well-nourished and well  groomed.   HEENT: Normocephalic, atraumatic, pupils are equal, round and reactive to light and accommodation. Funduscopic exam is normal with sharp disc margins noted.  Mild bilateral cataracts noted.  Extraocular tracking is good without limitation to gaze excursion or nystagmus noted. Normal smooth pursuit is noted. Hearing is grossly intact. Face is symmetric with normal facial animation and normal facial sensation. Speech is clear with no dysarthria noted. There is no hypophonia. There is no lip, neck/head, jaw or voice tremor. Neck is supple with full range of passive and active motion. There are no carotid bruits on auscultation. Oropharynx exam reveals: mild mouth dryness, good dental hygiene. Tongue protrudes centrally and palate elevates symmetrically.   Chest: Clear to auscultation without wheezing, rhonchi or crackles noted.  Heart: S1+S2+0, regular and normal without murmurs, rubs or gallops noted.   Abdomen: Soft, non-tender and non-distended with normal bowel sounds appreciated on auscultation.  Extremities: There is no pitting edema in the distal lower extremities bilaterally. Pedal pulses are intact.  Skin: Warm and dry without trophic changes noted.  Musculoskeletal: exam reveals no obvious joint deformities, tenderness or joint swelling or erythema.  Prominence noted around the left clavicle, possibly palpable hardware.  Neurologically:  Mental status: The patient is awake, alert and oriented in all 4 spheres. Her immediate and remote memory, attention, language skills and fund of knowledge are appropriate. There is no evidence of aphasia, agnosia, apraxia or anomia. Speech is clear with normal prosody and enunciation. Thought process is linear. Mood is normal and affect is normal.  Cranial nerves II - XII are as described above under HEENT exam. In addition: shoulder shrug is normal with equal shoulder height noted. Motor exam: Normal bulk, strength and tone is noted. There is  no drift, tremor or rebound.   On 12/15/2019: On Archimedes spiral  drawing there is no obvious trembling with the right hand which is her dominant hand, slight insecurity with the left hand which is her nondominant hand but no trembling noted, handwriting is legible, not tremulous, not micrographic.    She has no significant postural or action tremor in both upper extremities, no resting tremor, no intention tremor.  She has no lower extremity tremor.  Romberg is negative. Reflexes are 2+ throughout. Babinski: Toes are flexor bilaterally. Fine motor skills and coordination: intact with normal finger taps, normal hand movements, normal rapid alternating patting, normal foot taps and normal foot agility.  Cerebellar testing: No dysmetria or intention tremor on finger to nose testing. Heel to shin is unremarkable bilaterally. There is no truncal or gait ataxia.  Sensory exam: intact to light touch, vibration, temperature sense in the upper and lower extremities.  Small area of decreased sensation on and below the left clavicle. Gait, station and balance: She stands easily. No veering to one side is noted. No leaning to one side is noted. Posture is age-appropriate and stance is narrow based. Gait shows normal stride length and normal pace. No problems turning are noted.  Preserved arm swing noted, no shuffling.  Tandem walk is unremarkable.   Assessment and Plan:    In summary, Keasia Dubose is a very pleasant 64 y.o.-year old female with an underlying medical history of seasonal allergies, hypertension, hyperlipidemia, squamous cell cancer, anxiety, who presents for evaluation of her tremor.  Her history and examination are not telltale for essential tremor.  She has experienced an intermittent trembling as well as in her feeling of tremulousness especially in the context of recent stressors and increase in anxiety.  She has noticed improvement in her anxiety level since starting Lexapro.  Her  history and examination are not supportive of parkinsonism or Parkinson's disease.  She is reassured in that regard.  She admits to being worried about her symptoms.  She is advised to proceed with a brain MRI with and without contrast to rule out a structural cause of her symptoms and we will also do some blood work today and call her with the results.  I would be happy to reevaluate her in about 6 months.  We talked about tremor triggers.  She is advised to stay well-hydrated, well rested, and we talked about stress management.  I did not suggest any new medications today from my end of things.  We will continue to monitor her symptoms and examination.  We will call her with her blood test results and imaging test results and I plan to see her back routinely for recheck in 6 months.  I answered all her questions today and she was in agreement with the plan.  Thank you very much for allowing me to participate in the care of this nice patient. If I can be of any further assistance to you please do not hesitate to call me at [phone].  Sincerely,   Star Age, MD, PhD

## 2019-12-17 ENCOUNTER — Telehealth: Payer: Self-pay

## 2019-12-17 NOTE — Telephone Encounter (Signed)
I called pt. I discussed her lab results and recommendations. She would like to defer on the hematology referral for now. She also would like to cancel her MRI. She is more confident that her anxiety diagnosis is causing her symptoms and would prefer to forego the MRI for now. Pt verbalized understanding of results. Pt had no questions at this time but was encouraged to call back if questions arise.

## 2019-12-17 NOTE — Telephone Encounter (Signed)
Okay, noted. Thank you.

## 2019-12-17 NOTE — Progress Notes (Signed)
Please call patient regarding her blood work. There were some minor abnormalities noted.  In her chemistry panel, there is mild increase in her albumin and her bilirubin, this can reflect changes with her liver function and gallbladder.  Her bilirubin and albumin were elevated in June as well, in the same range, so they are quite stable.  I would recommend that she discuss this further with her primary care physician or nurse practitioner, if additional work-up is necessary.   In addition, in her protein breakdown called protein electrophoresis, she has a mild decrease in 1 immune globulin called IgA, this is slightly lower than the normal range, unlikely of clinical significance.  Nevertheless, if she would like, we can seek an opinion from a hematologist for further evaluation or if additional testing or repeat testing is warranted for low IgA levels.  We can make a referral to hematology, if she prefers.  Other blood test results were benign, 1 test result is pending, which is the thiamine or vitamin B1, we will update her, if abnormal.

## 2019-12-17 NOTE — Telephone Encounter (Signed)
-----   Message from Star Age, MD sent at 12/17/2019  9:07 AM EST ----- Please call patient regarding her blood work. There were some minor abnormalities noted.  In her chemistry panel, there is mild increase in her albumin and her bilirubin, this can reflect changes with her liver function and gallbladder.  Her bilirubin and albumin were elevated in June as well, in the same range, so they are quite stable.  I would recommend that she discuss this further with her primary care physician or nurse practitioner, if additional work-up is necessary.   In addition, in her protein breakdown called protein electrophoresis, she has a mild decrease in 1 immune globulin called IgA, this is slightly lower than the normal range, unlikely of clinical significance.  Nevertheless, if she would like, we can seek an opinion from a hematologist for further evaluation or if additional testing or repeat testing is warranted for low IgA levels.  We can make a referral to hematology, if she prefers.  Other blood test results were benign, 1 test result is pending, which is the thiamine or vitamin B1, we will update her, if abnormal.

## 2019-12-21 DIAGNOSIS — J029 Acute pharyngitis, unspecified: Secondary | ICD-10-CM | POA: Diagnosis not present

## 2019-12-21 DIAGNOSIS — Z20822 Contact with and (suspected) exposure to covid-19: Secondary | ICD-10-CM | POA: Diagnosis not present

## 2019-12-21 DIAGNOSIS — J069 Acute upper respiratory infection, unspecified: Secondary | ICD-10-CM | POA: Diagnosis not present

## 2019-12-22 ENCOUNTER — Other Ambulatory Visit: Payer: Self-pay

## 2019-12-22 ENCOUNTER — Ambulatory Visit: Payer: Medicare Other | Admitting: Family

## 2019-12-22 ENCOUNTER — Telehealth (INDEPENDENT_AMBULATORY_CARE_PROVIDER_SITE_OTHER): Payer: Medicare Other | Admitting: Family Medicine

## 2019-12-22 ENCOUNTER — Encounter: Payer: Self-pay | Admitting: Family Medicine

## 2019-12-22 DIAGNOSIS — J0101 Acute recurrent maxillary sinusitis: Secondary | ICD-10-CM | POA: Diagnosis not present

## 2019-12-22 MED ORDER — AMOXICILLIN-POT CLAVULANATE 875-125 MG PO TABS: 1.0000 | ORAL_TABLET | Freq: Two times a day (BID) | ORAL | 0 refills | Status: AC

## 2019-12-22 MED ORDER — PREDNISONE 20 MG PO TABS: 40.0000 mg | ORAL_TABLET | Freq: Every day | ORAL | 0 refills | Status: AC

## 2019-12-22 NOTE — Progress Notes (Signed)
Chief Complaint  Patient presents with   Cough   Nasal Congestion    Megan Middleton here for URI complaints. Due to COVID-19 pandemic, we are interacting via web portal for an electronic face-to-face visit. I verified patient's ID using 2 identifiers. Patient agreed to proceed with visit via this method. Patient is at home, I am at office. Patient and I are present for visit.   Duration: 5 week  Associated symptoms: sinus congestion, sinus pain, rhinorrhea, ear pain, sore throat, dental pain, and cough Denies: itchy watery eyes, ear drainage, wheezing, shortness of breath, myalgia and fevers Treatment to date: Zyrtec, Singulair Sick contacts: Yes- grandson  Past Medical History:  Diagnosis Date   Allergy    seasonal   Gilberts syndrome    Gynecological examination    sees Dr. [name]    Hyperlipidemia    Hypertension    Squamous cell carcinoma    Sees Derm [name]   Tubular adenoma of colon 2015   Exam No conversational dyspnea Age appropriate judgment and insight Nml affect and mood  Acute recurrent maxillary sinusitis - Plan: amoxicillin-clavulanate (AUGMENTIN) 875-125 MG tablet, predniSONE (DELTASONE) 20 MG tablet  Pred burst 5 d, 40 mg/d, if no better in 2 days will do a 7 d course of Augmentin.  Continue to push fluids, practice good hand hygiene, cover mouth when coughing. F/u prn. If starting to experience fevers, shaking, or shortness of breath, seek immediate care. Pt voiced understanding and agreement to the plan.  [place], DO 12/22/19 8:15 AM

## 2019-12-23 LAB — SEDIMENTATION RATE: Sed Rate: 3 mm/hr (ref 0–40)

## 2019-12-23 LAB — C-REACTIVE PROTEIN: CRP: <1 mg/L (ref 0–10)

## 2019-12-23 LAB — MULTIPLE MYELOMA PANEL, SERUM
Albumin SerPl Elph-Mcnc: 4.1 g/dL (ref 2.9–4.4)
Albumin/Glob SerPl: 1.5 (ref 0.7–1.7)
Alpha 1: 0.3 g/dL (ref 0.0–0.4)
Alpha2 Glob SerPl Elph-Mcnc: 0.8 g/dL (ref 0.4–1.0)
B-Globulin SerPl Elph-Mcnc: 1.1 g/dL (ref 0.7–1.3)
Gamma Glob SerPl Elph-Mcnc: 0.8 g/dL (ref 0.4–1.8)
Globulin, Total: 2.9 g/dL (ref 2.2–3.9)
IgA/Immunoglobulin A, Serum: 80 mg/dL — ABNORMAL LOW (ref 87–352)
IgG (Immunoglobin G), Serum: 661 mg/dL (ref 586–1602)
IgM (Immunoglobulin M), Srm: 129 mg/dL (ref 26–217)

## 2019-12-23 LAB — COMPREHENSIVE METABOLIC PANEL
ALT: 14 IU/L (ref 0–32)
AST: 16 IU/L (ref 0–40)
Albumin/Globulin Ratio: 2.5 — ABNORMAL HIGH (ref 1.2–2.2)
Albumin: 5 g/dL — ABNORMAL HIGH (ref 3.8–4.8)
Alkaline Phosphatase: 105 IU/L (ref 44–121)
BUN/Creatinine Ratio: 16 (ref 12–28)
BUN: 13 mg/dL (ref 8–27)
Bilirubin Total: 2.7 mg/dL — ABNORMAL HIGH (ref 0.0–1.2)
CO2: 25 mmol/L (ref 20–29)
Calcium: 9.6 mg/dL (ref 8.7–10.3)
Chloride: 100 mmol/L (ref 96–106)
Creatinine, Ser: 0.8 mg/dL (ref 0.57–1.00)
GFR calc Af Amer: 89 mL/min/{1.73_m2} (ref >59)
GFR calc non Af Amer: 77 mL/min/{1.73_m2} (ref >59)
Globulin, Total: 2 g/dL (ref 1.5–4.5)
Glucose: 93 mg/dL (ref 65–99)
Potassium: 3.6 mmol/L (ref 3.5–5.2)
Sodium: 140 mmol/L (ref 134–144)
Total Protein: 7 g/dL (ref 6.0–8.5)

## 2019-12-23 LAB — CK: Total CK: 77 U/L (ref 32–182)

## 2019-12-23 LAB — VITAMIN B1: Thiamine: 137.8 nmol/L (ref 66.5–200.0)

## 2019-12-23 LAB — ANA W/REFLEX: Anti Nuclear Antibody (ANA): NEGATIVE

## 2019-12-23 LAB — HTLV I+II ANTIBODIES, (EIA), BLD: HTLV I/II Ab: NEGATIVE

## 2019-12-23 LAB — TSH: TSH: 1.35 u[IU]/mL (ref 0.450–4.500)

## 2020-03-08 ENCOUNTER — Other Ambulatory Visit: Payer: Self-pay | Admitting: Family

## 2020-03-11 DIAGNOSIS — W908XXS Exposure to other nonionizing radiation, sequela: Secondary | ICD-10-CM | POA: Diagnosis not present

## 2020-03-11 DIAGNOSIS — L814 Other melanin hyperpigmentation: Secondary | ICD-10-CM | POA: Diagnosis not present

## 2020-03-11 DIAGNOSIS — Z86008 Personal history of in-situ neoplasm of other site: Secondary | ICD-10-CM | POA: Diagnosis not present

## 2020-03-11 DIAGNOSIS — L578 Other skin changes due to chronic exposure to nonionizing radiation: Secondary | ICD-10-CM | POA: Diagnosis not present

## 2020-03-11 DIAGNOSIS — X32XXXS Exposure to sunlight, sequela: Secondary | ICD-10-CM | POA: Diagnosis not present

## 2020-03-11 DIAGNOSIS — L821 Other seborrheic keratosis: Secondary | ICD-10-CM | POA: Diagnosis not present

## 2020-03-11 DIAGNOSIS — L570 Actinic keratosis: Secondary | ICD-10-CM | POA: Diagnosis not present

## 2020-04-20 DIAGNOSIS — J342 Deviated nasal septum: Secondary | ICD-10-CM | POA: Diagnosis not present

## 2020-04-20 DIAGNOSIS — J3489 Other specified disorders of nose and nasal sinuses: Secondary | ICD-10-CM | POA: Diagnosis not present

## 2020-04-20 DIAGNOSIS — J309 Allergic rhinitis, unspecified: Secondary | ICD-10-CM | POA: Diagnosis not present

## 2020-04-20 DIAGNOSIS — J329 Chronic sinusitis, unspecified: Secondary | ICD-10-CM | POA: Diagnosis not present

## 2020-04-20 DIAGNOSIS — K219 Gastro-esophageal reflux disease without esophagitis: Secondary | ICD-10-CM | POA: Diagnosis not present

## 2020-04-27 ENCOUNTER — Other Ambulatory Visit: Payer: Self-pay | Admitting: Family

## 2020-04-27 ENCOUNTER — Other Ambulatory Visit: Payer: Self-pay | Admitting: Obstetrics and Gynecology

## 2020-04-27 DIAGNOSIS — Z1231 Encounter for screening mammogram for malignant neoplasm of breast: Secondary | ICD-10-CM

## 2020-05-21 ENCOUNTER — Other Ambulatory Visit: Payer: Self-pay | Admitting: Family

## 2020-05-28 DIAGNOSIS — B309 Viral conjunctivitis, unspecified: Secondary | ICD-10-CM | POA: Diagnosis not present

## 2020-05-28 DIAGNOSIS — J029 Acute pharyngitis, unspecified: Secondary | ICD-10-CM | POA: Diagnosis not present

## 2020-05-31 ENCOUNTER — Telehealth: Payer: Self-pay | Admitting: Family

## 2020-05-31 ENCOUNTER — Encounter: Payer: Self-pay | Admitting: Family

## 2020-05-31 ENCOUNTER — Other Ambulatory Visit: Payer: Self-pay

## 2020-05-31 ENCOUNTER — Telehealth (INDEPENDENT_AMBULATORY_CARE_PROVIDER_SITE_OTHER): Payer: Medicare Other | Admitting: Family

## 2020-05-31 VITALS — Temp 98.6°F

## 2020-05-31 DIAGNOSIS — K219 Gastro-esophageal reflux disease without esophagitis: Secondary | ICD-10-CM | POA: Diagnosis not present

## 2020-05-31 DIAGNOSIS — E785 Hyperlipidemia, unspecified: Secondary | ICD-10-CM

## 2020-05-31 DIAGNOSIS — J0100 Acute maxillary sinusitis, unspecified: Secondary | ICD-10-CM | POA: Diagnosis not present

## 2020-05-31 DIAGNOSIS — I10 Essential (primary) hypertension: Secondary | ICD-10-CM | POA: Diagnosis not present

## 2020-05-31 DIAGNOSIS — Z0000 Encounter for general adult medical examination without abnormal findings: Secondary | ICD-10-CM

## 2020-05-31 DIAGNOSIS — F419 Anxiety disorder, unspecified: Secondary | ICD-10-CM | POA: Diagnosis not present

## 2020-05-31 MED ORDER — AMOXICILLIN-POT CLAVULANATE 875-125 MG PO TABS: 1.0000 | ORAL_TABLET | Freq: Two times a day (BID) | ORAL | 0 refills | Status: DC

## 2020-05-31 MED ORDER — OMEPRAZOLE 20 MG PO CPDR
20.0000 mg | DELAYED_RELEASE_CAPSULE | Freq: Every day | ORAL | 1 refills | Status: DC
Start: 2020-05-31 — End: 2020-11-15

## 2020-05-31 MED ORDER — AMLODIPINE BESYLATE 5 MG PO TABS: 1.0000 | ORAL_TABLET | Freq: Every day | ORAL | 1 refills | Status: DC

## 2020-05-31 NOTE — Assessment & Plan Note (Signed)
Stable/improved on omeprazole 40mg . Recommended that she decrease to 20mg .

## 2020-05-31 NOTE — Progress Notes (Addendum)
MyChart Video Visit    Virtual Visit via Video Note   This visit type was conducted due to national recommendations for restrictions regarding the COVID-19 Pandemic (e.g. social distancing) in an effort to limit this patient's exposure and mitigate transmission in our community. This patient is at least at moderate risk for complications without adequate follow up. This format is felt to be most appropriate for this patient at this time. Physical exam was limited by quality of the video and audio technology used for the visit. CMA was able to get the patient set up on a video visit.  Patient location: home Patient and provider in visit Provider location: Office  I discussed the limitations of evaluation and management by telemedicine and the availability of in person appointments. The patient expressed understanding and agreed to proceed.  Visit Date: 05/31/2020  Today's healthcare provider: Nance Pear, NP     Subjective:    Patient ID: Megan Middleton, female    DOB: 10/16/1955, 65 y.o.   MRN: [id number]  Chief Complaint  Patient presents with  . Sore Throat    Patient complains of sore throat since last week. Was seen at urgent care but "not given any medications"  . Nasal Congestion    Nasal congestion with thick mucus    HPI   Patient is in today for a sick visit.   Sore throat x 1 week. Went to urgent care. Was told strep negative and told to take tylenol cold and sinus. She notes no improvement with this otc medication.   Has soreness behind the right nostril "in the back." She does have some chronic allergies and was placed allegra, flonase and stopped her singulair. Notes "big hunks of stuff coming out with the neti pot. Has pain in the right ear and above the top teeth.    Anxiety- reports that this is much better. She continues the lexapro 20mg  and is pleased with the improvement. Tolerating lexapro without difficulty.  HTN- reports that her battery  died in her blood pressure cuff, but she checks her blood pressure once a month and it has been normal.   GERD- She saw ENT and was told that she had reflux and it was causing her to clear her throat all the time. States resolution of gerd symptoms and throat clearing since she started omeprazole 40mg  once daily per ENT's recommendation.  Requesting lab panel- understands it may not be covered by her insurance.  Past Medical History:  Diagnosis Date  . Allergy    seasonal  . Gilberts syndrome   . Gynecological examination    sees Dr. [name]   . Hyperlipidemia   . Hypertension   . Squamous cell carcinoma    Sees Derm [name]  . Tubular adenoma of colon 2015    Past Surgical History:  Procedure Laterality Date  . collar bone surgery     jan,2020  . COLONOSCOPY  10-08-13   per Dr. Fuller Plan, adenomatous polyps, repear in 5 yrs  . TUBAL LIGATION      Family History  Problem Relation Age of Onset  . Hypertension Father   . Breast cancer Sister   . Lymphoma Mother   . Leukemia Mother   . Colon cancer Neg Hx   . Esophageal cancer Neg Hx   . Rectal cancer Neg Hx   . Stomach cancer Neg Hx   . Colon polyps Neg Hx     Social History   Socioeconomic History  .  Marital status: Widowed    Spouse name: Not on file  . Number of children: Not on file  . Years of education: Not on file  . Highest education level: Not on file  Occupational History  . Not on file  Tobacco Use  . Smoking status: Never Smoker  . Smokeless tobacco: Never Used  Vaping Use  . Vaping Use: Not on file  Substance and Sexual Activity  . Alcohol use: Yes    Alcohol/week: 0.0 standard drinks    Comment: occ / social  . Drug use: No  . Sexual activity: Not Currently  Other Topics Concern  . 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 Difficulty of Paying Living Expenses: Not hard at all  Food Insecurity: No Food Insecurity  . Worried About Charity fundraiser in the Last Year: Never true  . Ran Out of Food in the Last Year: Never true  Transportation Needs: No Transportation Needs  . Lack of Transportation (Medical): No  . Lack of Transportation (Non-Medical): No  Physical Activity: Not on file  Stress: Not on file  Social Connections: Not on file  Intimate Partner Violence: Not on file    Outpatient Medications Prior to Visit  Medication Sig Dispense Refill  . amLODipine (NORVASC) 5 MG tablet TAKE 1 TABLET (5 MG TOTAL) BY MOUTH DAILY. 30 tablet 0  . calcium carbonate (OS-CAL) 600 MG TABS Take 600 mg by mouth daily. With vitamin d    . escitalopram (LEXAPRO) 20 MG tablet TAKE 1 TABLET BY MOUTH EVERY DAY 90 tablet 1  . NON FORMULARY JUICE PLUS     No facility-administered medications prior to visit.    Allergies  Allergen Reactions  . Doxycycline     GI side effects  . Morphine Other (See Comments) and Nausea Only    Low blood pressure    ROS     Objective:    Physical Exam Constitutional:      Appearance: She is well-developed.  HENT:     Head: Normocephalic and atraumatic.     Comments: Mild voice hoarsenss Neurological:     Mental Status: She is alert.  Psychiatric:        Attention and Perception: Attention normal.        Mood and Affect: Mood normal.        Speech: Speech normal.        Behavior: Behavior normal.        Cognition and Memory: Cognition and memory normal.     Temp 98.6 F (37 C)  Wt Readings from Last 3 Encounters:  12/15/19 137 lb (62.1 kg)  11/24/19 134 lb (60.8 kg)  10/07/19 132 lb 9.6 oz (60.1 kg)    Diabetic Foot Exam - Simple   No data filed    Lab Results  Component Value Date   WBC 7.6 10/16/2017   HGB 14.7  10/16/2017   HCT 42.8 10/16/2017   PLT 241.0 10/16/2017   GLUCOSE 93 12/15/2019   CHOL 202 (H) 07/09/2018   TRIG 219.0 (H) 07/09/2018   HDL 69.00 07/09/2018   LDLDIRECT 110.0 07/09/2018   LDLCALC 119 (H) 10/16/2017  ALT 14 12/15/2019   AST 16 12/15/2019   NA 140 12/15/2019   K 3.6 12/15/2019   CL 100 12/15/2019   CREATININE 0.80 12/15/2019   BUN 13 12/15/2019   CO2 25 12/15/2019   TSH 1.350 12/15/2019   HGBA1C 5.3 10/07/2019    Lab Results  Component Value Date   TSH 1.350 12/15/2019   Lab Results  Component Value Date   WBC 7.6 10/16/2017   HGB 14.7 10/16/2017   HCT 42.8 10/16/2017   MCV 92.6 10/16/2017   PLT 241.0 10/16/2017   Lab Results  Component Value Date   NA 140 12/15/2019   K 3.6 12/15/2019   CO2 25 12/15/2019   GLUCOSE 93 12/15/2019   BUN 13 12/15/2019   CREATININE 0.80 12/15/2019   BILITOT 2.7 (H) 12/15/2019   ALKPHOS 105 12/15/2019   AST 16 12/15/2019   ALT 14 12/15/2019   PROT 7.0 12/15/2019   ALBUMIN 5.0 (H) 12/15/2019   CALCIUM 9.6 12/15/2019   GFR 61.04 07/11/2019   Lab Results  Component Value Date   CHOL 202 (H) 07/09/2018   Lab Results  Component Value Date   HDL 69.00 07/09/2018   Lab Results  Component Value Date   LDLCALC 119 (H) 10/16/2017   Lab Results  Component Value Date   TRIG 219.0 (H) 07/09/2018   Lab Results  Component Value Date   CHOLHDL 3 07/09/2018   Lab Results  Component Value Date   HGBA1C 5.3 10/07/2019       Assessment & Plan:   Problem List Items Addressed This Visit   None     I am having Prince Solian maintain her calcium carbonate, NON FORMULARY, escitalopram, and amLODipine.  No orders of the defined types were placed in this encounter.   I discussed the assessment and treatment plan with the patient. The patient was provided an opportunity to ask questions and all were answered. The patient agreed with the plan and demonstrated an understanding of the instructions.   The  patient was advised to call back or seek an in-person evaluation if the symptoms worsen or if the condition fails to improve as anticipated. Nance Pear, NP [employer] [facility] [phone] (phone) [phone] (fax)  [place]

## 2020-05-31 NOTE — Assessment & Plan Note (Signed)
Stable per pt's report. Continue amlodipine 5mg  once daily. She will send me her reading via mychart once she gets a new battery in her BP machine.

## 2020-05-31 NOTE — Assessment & Plan Note (Signed)
Stable on lexapro 20mg  once daily. Continue same.

## 2020-05-31 NOTE — Assessment & Plan Note (Addendum)
Will rx with Augmentin 875mg  bid x 10 days. In addition, recommended that she swab herself for covid-19 and let me know if she tests positive. Continue nasal saline washes and d/c the cold/sinus medication as it is not helping her symptoms and may raise her bp.

## 2020-06-02 MED ORDER — CEFUROXIME AXETIL 500 MG PO TABS: 500.0000 mg | ORAL_TABLET | Freq: Two times a day (BID) | ORAL | 0 refills | Status: AC

## 2020-06-02 NOTE — Telephone Encounter (Signed)
Opened in error

## 2020-06-11 ENCOUNTER — Other Ambulatory Visit: Payer: Self-pay

## 2020-06-14 ENCOUNTER — Ambulatory Visit: Payer: Medicare Other | Admitting: Neurology

## 2020-06-14 ENCOUNTER — Other Ambulatory Visit: Payer: Medicare Other

## 2020-06-28 ENCOUNTER — Other Ambulatory Visit: Payer: Self-pay

## 2020-06-28 ENCOUNTER — Ambulatory Visit
Admission: RE | Admit: 2020-06-28 | Discharge: 2020-06-28 | Disposition: A | Discharge status: Home / Self Care | Payer: Medicare Other | Source: Ambulatory Visit | Attending: Obstetrics and Gynecology | Admitting: Obstetrics and Gynecology

## 2020-06-28 DIAGNOSIS — Z1231 Encounter for screening mammogram for malignant neoplasm of breast: Secondary | ICD-10-CM | POA: Diagnosis not present

## 2020-06-29 ENCOUNTER — Other Ambulatory Visit: Payer: Self-pay

## 2020-06-29 ENCOUNTER — Other Ambulatory Visit (INDEPENDENT_AMBULATORY_CARE_PROVIDER_SITE_OTHER): Payer: Medicare Other

## 2020-06-29 DIAGNOSIS — E785 Hyperlipidemia, unspecified: Secondary | ICD-10-CM | POA: Diagnosis not present

## 2020-06-29 DIAGNOSIS — R1312 Dysphagia, oropharyngeal phase: Secondary | ICD-10-CM | POA: Diagnosis not present

## 2020-06-29 DIAGNOSIS — Z0000 Encounter for general adult medical examination without abnormal findings: Secondary | ICD-10-CM | POA: Diagnosis not present

## 2020-06-29 DIAGNOSIS — H938X3 Other specified disorders of ear, bilateral: Secondary | ICD-10-CM | POA: Diagnosis not present

## 2020-06-29 DIAGNOSIS — I10 Essential (primary) hypertension: Secondary | ICD-10-CM

## 2020-06-29 DIAGNOSIS — J309 Allergic rhinitis, unspecified: Secondary | ICD-10-CM | POA: Diagnosis not present

## 2020-06-29 DIAGNOSIS — J0100 Acute maxillary sinusitis, unspecified: Secondary | ICD-10-CM | POA: Diagnosis not present

## 2020-06-29 DIAGNOSIS — K219 Gastro-esophageal reflux disease without esophagitis: Secondary | ICD-10-CM | POA: Diagnosis not present

## 2020-06-29 LAB — COMPREHENSIVE METABOLIC PANEL
ALT: 10 U/L (ref 0–35)
AST: 14 U/L (ref 0–37)
Albumin: 4.7 g/dL (ref 3.5–5.2)
Alkaline Phosphatase: 105 U/L (ref 39–117)
BUN: 15 mg/dL (ref 6–23)
CO2: 30 mEq/L (ref 19–32)
Calcium: 9.6 mg/dL (ref 8.4–10.5)
Chloride: 101 mEq/L (ref 96–112)
Creatinine, Ser: 0.79 mg/dL (ref 0.40–1.20)
GFR: 77.55 mL/min (ref >60.00)
Glucose, Bld: 89 mg/dL (ref 70–99)
Potassium: 4 mEq/L (ref 3.5–5.1)
Sodium: 142 mEq/L (ref 135–145)
Total Bilirubin: 2.3 mg/dL — ABNORMAL HIGH (ref 0.2–1.2)
Total Protein: 6.8 g/dL (ref 6.0–8.3)

## 2020-06-29 LAB — LIPID PANEL
Cholesterol: 235 mg/dL — ABNORMAL HIGH (ref 0–200)
HDL: 65.2 mg/dL (ref >39.00)
LDL Cholesterol: 147 mg/dL — ABNORMAL HIGH (ref 0–99)
NonHDL: 169.97
Total CHOL/HDL Ratio: 4
Triglycerides: 116 mg/dL (ref 0.0–149.0)
VLDL: 23.2 mg/dL (ref 0.0–40.0)

## 2020-06-29 LAB — CBC WITH DIFFERENTIAL/PLATELET
Basophils Absolute: 0.1 10*3/uL (ref 0.0–0.1)
Basophils Relative: 2 % (ref 0.0–3.0)
Eosinophils Absolute: 0.3 10*3/uL (ref 0.0–0.7)
Eosinophils Relative: 5 % (ref 0.0–5.0)
HCT: 42.2 % (ref 36.0–46.0)
Hemoglobin: 14.4 g/dL (ref 12.0–15.0)
Lymphocytes Relative: 25 % (ref 12.0–46.0)
Lymphs Abs: 1.4 10*3/uL (ref 0.7–4.0)
MCHC: 34 g/dL (ref 30.0–36.0)
MCV: 92.1 fl (ref 78.0–100.0)
Monocytes Absolute: 0.3 10*3/uL (ref 0.1–1.0)
Monocytes Relative: 5.9 % (ref 3.0–12.0)
Neutro Abs: 3.4 10*3/uL (ref 1.4–7.7)
Neutrophils Relative %: 62.1 % (ref 43.0–77.0)
Platelets: 238 10*3/uL (ref 150.0–400.0)
RBC: 4.58 Mil/uL (ref 3.87–5.11)
RDW: 13.4 % (ref 11.5–15.5)
WBC: 5.4 10*3/uL (ref 4.0–10.5)

## 2020-06-29 LAB — TSH: TSH: 1.92 u[IU]/mL (ref 0.35–4.50)

## 2020-07-05 DIAGNOSIS — K2289 Other specified disease of esophagus: Secondary | ICD-10-CM | POA: Diagnosis not present

## 2020-07-05 DIAGNOSIS — K219 Gastro-esophageal reflux disease without esophagitis: Secondary | ICD-10-CM | POA: Diagnosis not present

## 2020-08-05 DIAGNOSIS — D485 Neoplasm of uncertain behavior of skin: Secondary | ICD-10-CM | POA: Diagnosis not present

## 2020-08-05 DIAGNOSIS — L821 Other seborrheic keratosis: Secondary | ICD-10-CM | POA: Diagnosis not present

## 2020-08-05 DIAGNOSIS — L905 Scar conditions and fibrosis of skin: Secondary | ICD-10-CM | POA: Diagnosis not present

## 2020-08-05 DIAGNOSIS — L570 Actinic keratosis: Secondary | ICD-10-CM | POA: Diagnosis not present

## 2020-08-14 DIAGNOSIS — M25561 Pain in right knee: Secondary | ICD-10-CM | POA: Diagnosis not present

## 2020-08-18 NOTE — Progress Notes (Signed)
Subjective:   Megan Middleton is a 65 y.o. female who presents for Medicare Annual (Subsequent) preventive examination.  Review of Systems     Cardiac Risk Factors include: advanced age (>75mn, >>22women);hypertension;dyslipidemia     Objective:    Today's Vitals   08/19/20 0805  BP: 122/84  Pulse: 63  Resp: 16  Temp: 98 F (36.7 C)  TempSrc: Temporal  SpO2: 97%  Weight: 142 lb (64.4 kg)  Height: 5' 7.5" (1.715 m)   Body mass index is 21.91 kg/m.  Advanced Directives 08/19/2020 08/13/2019 03/08/2014 10/08/2013 09/24/2013  Does Patient Have a Medical Advance Directive? Yes Yes No Yes Yes  Type of AParamed[place]of [place] will [place] will - Healthcare Power of [place] will  Does patient want to make changes to medical advance directive? - No - Patient declined - - -  Copy of [place] Chart? No - copy requested No - copy requested - - -  Would patient like information on creating a medical advance directive? - - No - patient declined information - -    Current Medications (verified) Outpatient Encounter Medications as of 08/19/2020  Medication Sig   amLODipine (NORVASC) 5 MG tablet Take 1 tablet (5 mg total) by mouth daily.   calcium carbonate (OS-CAL) 600 MG TABS Take 600 mg by mouth daily. With vitamin d   escitalopram (LEXAPRO) 20 MG tablet TAKE 1 TABLET BY MOUTH EVERY DAY   famotidine (PEPCID) 20 MG tablet Take 20 mg by mouth at bedtime.   NON FORMULARY JUICE PLUS   omeprazole (PRILOSEC) 20 MG capsule Take 1 capsule (20 mg total) by mouth daily.   No facility-administered encounter medications on file as of 08/19/2020.    Allergies (verified) Doxycycline and Morphine   History: Past Medical History:  Diagnosis Date   Allergy    seasonal   Gilberts syndrome    Gynecological examination    sees Dr. KPaula Compton   Hyperlipidemia     Hypertension    Squamous cell carcinoma    Sees Derm ATheora Gianotti  Tubular adenoma of colon 2015   Past Surgical History:  Procedure Laterality Date   collar bone surgery     jan,2020   COLONOSCOPY  10-08-13   per Dr. SFuller Plan adenomatous polyps, repear in 5 yrs   TUBAL LIGATION     Family History  Problem Relation Age of Onset   Hypertension Father    Breast cancer Sister    Lymphoma Mother    Leukemia Mother    Colon cancer Neg Hx    Esophageal cancer Neg Hx    Rectal cancer Neg Hx    Stomach cancer Neg Hx    Colon polyps Neg Hx    Social History   Socioeconomic History   Marital status: Widowed    Spouse name: Not on file   Number of children: Not on file   Years of education: Not on file   Highest education level: Not on file  Occupational History   Not on file  Tobacco Use   Smoking status: Never   Smokeless tobacco: Never  Vaping Use   Vaping Use: Not on file  Substance and Sexual Activity   Alcohol use: Yes    Alcohol/week: 0.0 standard drinks    Comment: occ / social   Drug use: No   Sexual activity: Not Currently  Other Topics Concern   Not  on file  Social History Narrative   Originally from [place]   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[name]   Completed 2 year community college   Has one daughter- [name]   2 grandchildren   Enjoys volunteering, GYM, crafts, reading   Social Determinants of Radio broadcast assistant Strain: Low Risk    Difficulty of Paying Living Expenses: Not hard at [employer] Insecurity: No Food Insecurity   Worried About Charity fundraiser in the Last Year: Never true   Arboriculturist in the Last Year: Never true  Transportation Needs: No Transportation Needs   Lack of Transportation (Medical): No   Lack of Transportation (Non-Medical): No  Physical Activity: Sufficiently Active   Days of Exercise per Week: 7 days   Minutes of Exercise per Session: 40 min  Stress: No Stress Concern Present    Feeling of Stress : Not at all  Social Connections: Moderately Integrated   Frequency of Communication with Friends and Family: More than three times a week   Frequency of Social Gatherings with Friends and Family: More than three times a week   Attends Religious Services: More than 4 times per year   Active Member of [name] or Organizations: Yes   Attends Archivist Meetings: 1 to 4 times per year   Marital Status: Widowed    Tobacco Counseling Counseling given: Not Answered   Clinical Intake:  Pre-visit preparation completed: Yes  Pain : No/denies pain     Nutritional Status: BMI of 19-24  Normal Nutritional Risks: None Diabetes: No  How often do you need to have someone help you when you read instructions, pamphlets, or other written materials from your doctor or pharmacy?: 1 - Never  Diabetic?No  Interpreter Needed?: No  Information entered by :: Caroleen Hamman LPN   Activities of Daily Living In your present state of health, do you have any difficulty performing the following activities: 08/19/2020  Hearing? N  Vision? N  Difficulty concentrating or making decisions? N  Walking or climbing stairs? N  Dressing or bathing? N  Doing errands, shopping? N  Preparing Food and eating ? N  Using the Toilet? N  In the past six months, have you accidently leaked urine? N  Do you have problems with loss of bowel control? N  Managing your Medications? N  Managing your Finances? N  Housekeeping or managing your Housekeeping? N  Some recent data might be hidden    Patient Care Team: [name], NP as PCP - General (Internal Medicine) [name], MD as Consulting Physician (Obstetrics and Gynecology) Earlie Server, MD as Consulting Physician (Orthopedic Surgery)  Indicate any recent Medical Services you may have received from other than [facility] providers in the past year (date may be approximate).     Assessment:   This is a routine wellness  examination for Megan Middleton.  Hearing/Vision screen Hearing Screening - Comments:: No issues Vision Screening - Comments:: Reading glasses Last eye exam-08/2020-Dr. Atkins  Dietary issues and exercise activities discussed: Current Exercise Habits: Home exercise routine, Type of exercise: strength training/weights;walking, Time (Minutes): 45, Frequency (Times/Week): 7, Weekly Exercise (Minutes/Week): 315, Intensity: Mild, Exercise limited by: None identified   Goals Addressed             This Visit's Progress    Maintain healthy active lifestyle   On track      Depression Screen [facility] 2/9 Scores 08/19/2020 08/13/2019 07/25/2019 04/23/2017 04/03/2016 01/03/2016 12/06/2015  PHQ - 2 Score 0 0 0 0 0 0 0  PHQ- 9 Score - - 4 1 - - -    Fall Risk Fall Risk  08/19/2020 08/13/2019 07/25/2019 01/03/2016 12/06/2015  Falls in the past year? 0 0 0 No No  Number falls in past yr: 0 0 0 - -  Injury with Fall? 0 0 0 - -  Follow up Falls prevention discussed Education provided;Falls prevention discussed - - -    FALL RISK PREVENTION PERTAINING TO THE HOME:  Any stairs in or around the home? No  Home free of loose throw rugs in walkways, pet beds, electrical cords, etc? Yes  Adequate lighting in your home to reduce risk of falls? Yes   ASSISTIVE DEVICES UTILIZED TO PREVENT FALLS:  Life alert? No  Use of a cane, walker or w/c? No  Grab bars in the bathroom? Yes  Shower chair or bench in shower? No  Elevated toilet seat or a handicapped toilet? No   TIMED UP AND GO:  Was the test performed? Yes .  Length of time to ambulate 10 feet: 10 sec.   Gait steady and fast without use of assistive device  Cognitive Function:        Immunizations Immunization History  Administered Date(s) Administered   Fluad Quad(high Dose 65+) 10/09/2018, 10/07/2019   Influenza Split 11/09/2010, 11/10/2011, 11/05/2012   Influenza, High Dose Seasonal PF 10/30/2016   Influenza,inj,Quad PF,6+ Mos 10/16/2017    Influenza-Unspecified 11/06/2013, 11/26/2014, 10/27/2015   PFIZER(Purple Top)SARS-COV-2 Vaccination 02/12/2019, 03/05/2019, 11/27/2019   Pneumococcal Conjugate-13 07/09/2018   Tdap 09/24/2010, 11/12/2019   Zoster Recombinat (Shingrix) 10/16/2017, 12/24/2017   Zoster, Live 04/27/2014    TDAP status: Up to date  Flu Vaccine status: Up to date  Pneumococcal vaccine status: Due, Education has been provided regarding the importance of this vaccine. Advised may receive this vaccine at local pharmacy or Health Dept. Aware to provide a copy of the vaccination record if obtained from local pharmacy or Health Dept. Verbalized acceptance and understanding.  Covid-19 vaccine status: Information provided on how to obtain vaccines. Booster due  Qualifies for Shingles Vaccine? No   Zostavax completed Yes   Shingrix Completed?: Yes  Screening Tests Health Maintenance  Topic Date Due   Hepatitis C Screening  Never done   PNA vac Low Risk Adult (2 of 2 - PPSV23) 07/09/2019   COVID-19 Vaccine (4 - Booster for Pfizer series) 03/26/2020   INFLUENZA VACCINE  08/23/2020   MAMMOGRAM  06/28/2021   COLONOSCOPY (Pts 45-75yr Insurance coverage will need to be confirmed)  07/02/2025   TETANUS/TDAP  11/11/2029   DEXA SCAN  Completed   Zoster Vaccines- Shingrix  Completed   HPV VACCINES  Aged Out    Health Maintenance  Health Maintenance Due  Topic Date Due   Hepatitis C Screening  Never done   PNA vac Low Risk Adult (2 of 2 - PPSV23) 07/09/2019   COVID-19 Vaccine (4 - Booster for PThrallseries) 03/26/2020    Colorectal cancer screening: Type of screening: Colonoscopy. Completed 07/03/2018. Repeat every 7 years  Mammogram status: Completed Bilateral 06/28/2020. Repeat every year  Bone Density status: Ordered today. Pt provided with contact info and advised to call to schedule appt.  Lung Cancer Screening: (Low Dose CT Chest recommended if Age 67-80years, 30 pack-year currently smoking OR have quit  w/in 15years.) does not qualify.     Additional Screening:  Hepatitis C Screening: does qualify; Discuss with PCP  Vision  Screening: Recommended annual ophthalmology exams for early detection of glaucoma and other disorders of the eye. Is the patient up to date with their annual eye exam?  Yes  Who is the provider or what is the name of the office in which the patient attends annual eye exams? Dr. Orvan Seen   Dental Screening: Recommended annual dental exams for proper oral hygiene  Community Resource Referral / Chronic Care Management: CRR required this visit?  No   CCM required this visit?  No      Plan:     I have personally reviewed and noted the following in the patient's chart:   Medical and social history Use of alcohol, tobacco or illicit drugs  Current medications and supplements including opioid prescriptions.  Functional ability and status Nutritional status Physical activity Advanced directives List of other physicians Hospitalizations, surgeries, and ER visits in previous 12 months Vitals Screenings to include cognitive, depression, and falls Referrals and appointments  In addition, I have reviewed and discussed with patient certain preventive protocols, quality metrics, and best practice recommendations. A written personalized care plan for preventive services as well as general preventive health recommendations were provided to patient.   Patient to access avs on mychart  Marta Antu, [place]   [id number]  Nurse Health Advisor  Nurse Notes: None

## 2020-08-19 ENCOUNTER — Other Ambulatory Visit: Payer: Self-pay

## 2020-08-19 ENCOUNTER — Ambulatory Visit (INDEPENDENT_AMBULATORY_CARE_PROVIDER_SITE_OTHER): Payer: Medicare Other

## 2020-08-19 VITALS — BP 122/84 | HR 63 | Temp 98.0°F | Resp 16 | Ht 67.5 in | Wt 142.0 lb

## 2020-08-19 DIAGNOSIS — Z0000 Encounter for general adult medical examination without abnormal findings: Secondary | ICD-10-CM | POA: Diagnosis not present

## 2020-08-19 DIAGNOSIS — Z780 Asymptomatic menopausal state: Secondary | ICD-10-CM | POA: Diagnosis not present

## 2020-08-19 NOTE — Patient Instructions (Signed)
Megan Middleton , Thank you for taking time to come for your Medicare Wellness Visit. I appreciate your ongoing commitment to your health goals. Please review the following plan we discussed and let me know if I can assist you in the future.   Screening recommendations/referrals: Colonoscopy: Completed 07/13/2018-Due 07/02/2025 Mammogram: Completed 06/28/2020-Due 06/28/2021 Bone Density: Ordered today. Someone will call you to schedule. Recommended yearly ophthalmology/optometry visit for glaucoma screening and checkup Recommended yearly dental visit for hygiene and checkup  Vaccinations: Influenza vaccine: Up to date Pneumococcal vaccine: Up to date Tdap vaccine: Up to date-Due-11/11/2029 Shingles vaccine: Completed vaccines   Covid-19:Booster due  Advanced directives: Please bring a copy for your chart  Conditions/risks identified: See problem list  Next appointment: Follow up in one year for your annual wellness visit 08/25/2021 @ 8:20   Preventive Care 67 Years and Older, Female Preventive care refers to lifestyle choices and visits with your health care provider that can promote health and wellness. What does preventive care include? A yearly physical exam. This is also called an annual well check. Dental exams once or twice a year. Routine eye exams. Ask your health care provider how often you should have your eyes checked. Personal lifestyle choices, including: Daily care of your teeth and gums. Regular physical activity. Eating a healthy diet. Avoiding tobacco and drug use. Limiting alcohol use. Practicing safe sex. Taking low-dose aspirin every day. Taking vitamin and mineral supplements as recommended by your health care provider. What happens during an annual well check? The services and screenings done by your health care provider during your annual well check will depend on your age, overall health, lifestyle risk factors, and family history of disease. Counseling  Your  health care provider may ask you questions about your: Alcohol use. Tobacco use. Drug use. Emotional well-being. Home and relationship well-being. Sexual activity. Eating habits. History of falls. Memory and ability to understand (cognition). Work and work Statistician. Reproductive health. Screening  You may have the following tests or measurements: Height, weight, and BMI. Blood pressure. Lipid and cholesterol levels. These may be checked every 5 years, or more frequently if you are over 23 years old. Skin check. Lung cancer screening. You may have this screening every year starting at age 67 if you have a 30-pack-year history of smoking and currently smoke or have quit within the past 15 years. Fecal occult blood test (FOBT) of the stool. You may have this test every year starting at age 70. Flexible sigmoidoscopy or colonoscopy. You may have a sigmoidoscopy every 5 years or a colonoscopy every 10 years starting at age 67. Hepatitis C blood test. Hepatitis B blood test. Sexually transmitted disease (STD) testing. Diabetes screening. This is done by checking your blood sugar (glucose) after you have not eaten for a while (fasting). You may have this done every 1-3 years. Bone density scan. This is done to screen for osteoporosis. You may have this done starting at age 67. Mammogram. This may be done every 1-2 years. Talk to your health care provider about how often you should have regular mammograms. Talk with your health care provider about your test results, treatment options, and if necessary, the need for more tests. Vaccines  Your health care provider may recommend certain vaccines, such as: Influenza vaccine. This is recommended every year. Tetanus, diphtheria, and acellular pertussis (Tdap, Td) vaccine. You may need a Td booster every 10 years. Zoster vaccine. You may need this after age 30. Pneumococcal 13-valent conjugate (PCV13) vaccine. One  dose is recommended after age  21. Pneumococcal polysaccharide (PPSV23) vaccine. One dose is recommended after age 20. Talk to your health care provider about which screenings and vaccines you need and how often you need them. This information is not intended to replace advice given to you by your health care provider. Make sure you discuss any questions you have with your health care provider. Document Released: 02/05/2015 Document Revised: 09/29/2015 Document Reviewed: 11/10/2014 Elsevier Interactive Patient Education  2017 [place] Prevention in the Home Falls can cause injuries. They can happen to people of all ages. There are many things you can do to make your home safe and to help prevent falls. What can I do on the outside of my home? Regularly fix the edges of walkways and driveways and fix any cracks. Remove anything that might make you trip as you walk through a door, such as a raised step or threshold. Trim any bushes or trees on the path to your home. Use bright outdoor lighting. Clear any walking paths of anything that might make someone trip, such as rocks or tools. Regularly check to see if handrails are loose or broken. Make sure that both sides of any steps have handrails. Any raised decks and porches should have guardrails on the edges. Have any leaves, snow, or ice cleared regularly. Use sand or salt on walking paths during winter. Clean up any spills in your garage right away. This includes oil or grease spills. What can I do in the bathroom? Use night lights. Install grab bars by the toilet and in the tub and shower. Do not use towel bars as grab bars. Use non-skid mats or decals in the tub or shower. If you need to sit down in the shower, use a plastic, non-slip stool. Keep the floor dry. Clean up any water that spills on the floor as soon as it happens. Remove soap buildup in the tub or shower regularly. Attach bath mats securely with double-sided non-slip rug tape. Do not have throw  rugs and other things on the floor that can make you trip. What can I do in the bedroom? Use night lights. Make sure that you have a light by your bed that is easy to reach. Do not use any sheets or blankets that are too big for your bed. They should not hang down onto the floor. Have a firm chair that has side arms. You can use this for support while you get dressed. Do not have throw rugs and other things on the floor that can make you trip. What can I do in the kitchen? Clean up any spills right away. Avoid walking on wet floors. Keep items that you use a lot in easy-to-reach places. If you need to reach something above you, use a strong step stool that has a grab bar. Keep electrical cords out of the way. Do not use floor polish or wax that makes floors slippery. If you must use wax, use non-skid floor wax. Do not have throw rugs and other things on the floor that can make you trip. What can I do with my stairs? Do not leave any items on the stairs. Make sure that there are handrails on both sides of the stairs and use them. Fix handrails that are broken or loose. Make sure that handrails are as long as the stairways. Check any carpeting to make sure that it is firmly attached to the stairs. Fix any carpet that is loose or worn.  Avoid having throw rugs at the top or bottom of the stairs. If you do have throw rugs, attach them to the floor with carpet tape. Make sure that you have a light switch at the top of the stairs and the bottom of the stairs. If you do not have them, ask someone to add them for you. What else can I do to help prevent falls? Wear shoes that: Do not have high heels. Have rubber bottoms. Are comfortable and fit you well. Are closed at the toe. Do not wear sandals. If you use a stepladder: Make sure that it is fully opened. Do not climb a closed stepladder. Make sure that both sides of the stepladder are locked into place. Ask someone to hold it for you, if  possible. Clearly mark and make sure that you can see: Any grab bars or handrails. First and last steps. Where the edge of each step is. Use tools that help you move around (mobility aids) if they are needed. These include: Canes. Walkers. Scooters. Crutches. Turn on the lights when you go into a dark area. Replace any light bulbs as soon as they burn out. Set up your furniture so you have a clear path. Avoid moving your furniture around. If any of your floors are uneven, fix them. If there are any pets around you, be aware of where they are. Review your medicines with your doctor. Some medicines can make you feel dizzy. This can increase your chance of falling. Ask your doctor what other things that you can do to help prevent falls. This information is not intended to replace advice given to you by your health care provider. Make sure you discuss any questions you have with your health care provider. Document Released: 11/05/2008 Document Revised: 06/17/2015 Document Reviewed: 02/13/2014 Elsevier Interactive Patient Education  2017 [employer].

## 2020-08-22 ENCOUNTER — Other Ambulatory Visit: Payer: Self-pay | Admitting: Family

## 2020-09-06 DIAGNOSIS — M25561 Pain in right knee: Secondary | ICD-10-CM | POA: Diagnosis not present

## 2020-09-07 DIAGNOSIS — R1312 Dysphagia, oropharyngeal phase: Secondary | ICD-10-CM | POA: Diagnosis not present

## 2020-09-07 DIAGNOSIS — R0989 Other specified symptoms and signs involving the circulatory and respiratory systems: Secondary | ICD-10-CM | POA: Diagnosis not present

## 2020-09-07 DIAGNOSIS — K224 Dyskinesia of esophagus: Secondary | ICD-10-CM | POA: Diagnosis not present

## 2020-09-07 DIAGNOSIS — R059 Cough, unspecified: Secondary | ICD-10-CM | POA: Diagnosis not present

## 2020-09-09 DIAGNOSIS — L820 Inflamed seborrheic keratosis: Secondary | ICD-10-CM | POA: Diagnosis not present

## 2020-09-09 DIAGNOSIS — W908XXS Exposure to other nonionizing radiation, sequela: Secondary | ICD-10-CM | POA: Diagnosis not present

## 2020-09-09 DIAGNOSIS — L578 Other skin changes due to chronic exposure to nonionizing radiation: Secondary | ICD-10-CM | POA: Diagnosis not present

## 2020-09-09 DIAGNOSIS — D2371 Other benign neoplasm of skin of right lower limb, including hip: Secondary | ICD-10-CM | POA: Diagnosis not present

## 2020-09-09 DIAGNOSIS — D1801 Hemangioma of skin and subcutaneous tissue: Secondary | ICD-10-CM | POA: Diagnosis not present

## 2020-09-09 DIAGNOSIS — L814 Other melanin hyperpigmentation: Secondary | ICD-10-CM | POA: Diagnosis not present

## 2020-09-09 DIAGNOSIS — L570 Actinic keratosis: Secondary | ICD-10-CM | POA: Diagnosis not present

## 2020-09-09 DIAGNOSIS — Z859 Personal history of malignant neoplasm, unspecified: Secondary | ICD-10-CM | POA: Diagnosis not present

## 2020-09-09 DIAGNOSIS — X32XXXS Exposure to sunlight, sequela: Secondary | ICD-10-CM | POA: Diagnosis not present

## 2020-09-10 DIAGNOSIS — M25561 Pain in right knee: Secondary | ICD-10-CM | POA: Diagnosis not present

## 2020-09-13 ENCOUNTER — Other Ambulatory Visit (HOSPITAL_BASED_OUTPATIENT_CLINIC_OR_DEPARTMENT_OTHER): Payer: Medicare Other

## 2020-09-30 ENCOUNTER — Ambulatory Visit (HOSPITAL_BASED_OUTPATIENT_CLINIC_OR_DEPARTMENT_OTHER)
Admission: RE | Admit: 2020-09-30 | Discharge: 2020-09-30 | Disposition: A | Discharge status: Home / Self Care | Payer: Medicare Other | Source: Ambulatory Visit | Attending: Internal Medicine | Admitting: Internal Medicine

## 2020-09-30 ENCOUNTER — Other Ambulatory Visit: Payer: Self-pay

## 2020-09-30 DIAGNOSIS — M25561 Pain in right knee: Secondary | ICD-10-CM | POA: Diagnosis not present

## 2020-09-30 DIAGNOSIS — Z780 Asymptomatic menopausal state: Secondary | ICD-10-CM | POA: Insufficient documentation

## 2020-09-30 DIAGNOSIS — M85851 Other specified disorders of bone density and structure, right thigh: Secondary | ICD-10-CM | POA: Diagnosis not present

## 2020-10-02 ENCOUNTER — Encounter: Payer: Self-pay | Admitting: Family

## 2020-10-02 DIAGNOSIS — M8580 Other specified disorders of bone density and structure, unspecified site: Secondary | ICD-10-CM

## 2020-10-02 HISTORY — DX: Other specified disorders of bone density and structure, unspecified site: M85.80

## 2020-10-07 DIAGNOSIS — M25561 Pain in right knee: Secondary | ICD-10-CM | POA: Diagnosis not present

## 2020-10-07 DIAGNOSIS — M25661 Stiffness of right knee, not elsewhere classified: Secondary | ICD-10-CM | POA: Diagnosis not present

## 2020-10-07 DIAGNOSIS — R262 Difficulty in walking, not elsewhere classified: Secondary | ICD-10-CM | POA: Diagnosis not present

## 2020-10-07 DIAGNOSIS — M6281 Muscle weakness (generalized): Secondary | ICD-10-CM | POA: Diagnosis not present

## 2020-10-11 DIAGNOSIS — M6281 Muscle weakness (generalized): Secondary | ICD-10-CM | POA: Diagnosis not present

## 2020-10-11 DIAGNOSIS — M25561 Pain in right knee: Secondary | ICD-10-CM | POA: Diagnosis not present

## 2020-10-11 DIAGNOSIS — M25661 Stiffness of right knee, not elsewhere classified: Secondary | ICD-10-CM | POA: Diagnosis not present

## 2020-10-11 DIAGNOSIS — R262 Difficulty in walking, not elsewhere classified: Secondary | ICD-10-CM | POA: Diagnosis not present

## 2020-10-12 DIAGNOSIS — L570 Actinic keratosis: Secondary | ICD-10-CM | POA: Diagnosis not present

## 2020-10-18 DIAGNOSIS — R262 Difficulty in walking, not elsewhere classified: Secondary | ICD-10-CM | POA: Diagnosis not present

## 2020-10-18 DIAGNOSIS — M25561 Pain in right knee: Secondary | ICD-10-CM | POA: Diagnosis not present

## 2020-10-18 DIAGNOSIS — M6281 Muscle weakness (generalized): Secondary | ICD-10-CM | POA: Diagnosis not present

## 2020-10-18 DIAGNOSIS — M25661 Stiffness of right knee, not elsewhere classified: Secondary | ICD-10-CM | POA: Diagnosis not present

## 2020-10-20 DIAGNOSIS — H5203 Hypermetropia, bilateral: Secondary | ICD-10-CM | POA: Diagnosis not present

## 2020-10-20 DIAGNOSIS — H43393 Other vitreous opacities, bilateral: Secondary | ICD-10-CM | POA: Diagnosis not present

## 2020-10-20 DIAGNOSIS — H524 Presbyopia: Secondary | ICD-10-CM | POA: Diagnosis not present

## 2020-10-20 DIAGNOSIS — H2513 Age-related nuclear cataract, bilateral: Secondary | ICD-10-CM | POA: Diagnosis not present

## 2020-10-22 DIAGNOSIS — M25661 Stiffness of right knee, not elsewhere classified: Secondary | ICD-10-CM | POA: Diagnosis not present

## 2020-10-22 DIAGNOSIS — R262 Difficulty in walking, not elsewhere classified: Secondary | ICD-10-CM | POA: Diagnosis not present

## 2020-10-22 DIAGNOSIS — M25561 Pain in right knee: Secondary | ICD-10-CM | POA: Diagnosis not present

## 2020-10-22 DIAGNOSIS — M6281 Muscle weakness (generalized): Secondary | ICD-10-CM | POA: Diagnosis not present

## 2020-10-28 DIAGNOSIS — M25661 Stiffness of right knee, not elsewhere classified: Secondary | ICD-10-CM | POA: Diagnosis not present

## 2020-10-28 DIAGNOSIS — R262 Difficulty in walking, not elsewhere classified: Secondary | ICD-10-CM | POA: Diagnosis not present

## 2020-10-28 DIAGNOSIS — M25561 Pain in right knee: Secondary | ICD-10-CM | POA: Diagnosis not present

## 2020-10-28 DIAGNOSIS — M6281 Muscle weakness (generalized): Secondary | ICD-10-CM | POA: Diagnosis not present

## 2020-10-29 DIAGNOSIS — M25561 Pain in right knee: Secondary | ICD-10-CM | POA: Diagnosis not present

## 2020-10-29 DIAGNOSIS — M6281 Muscle weakness (generalized): Secondary | ICD-10-CM | POA: Diagnosis not present

## 2020-10-29 DIAGNOSIS — M25661 Stiffness of right knee, not elsewhere classified: Secondary | ICD-10-CM | POA: Diagnosis not present

## 2020-10-29 DIAGNOSIS — R262 Difficulty in walking, not elsewhere classified: Secondary | ICD-10-CM | POA: Diagnosis not present

## 2020-10-31 DIAGNOSIS — Z23 Encounter for immunization: Secondary | ICD-10-CM | POA: Diagnosis not present

## 2020-11-01 DIAGNOSIS — M25561 Pain in right knee: Secondary | ICD-10-CM | POA: Diagnosis not present

## 2020-11-02 DIAGNOSIS — R262 Difficulty in walking, not elsewhere classified: Secondary | ICD-10-CM | POA: Diagnosis not present

## 2020-11-02 DIAGNOSIS — M25561 Pain in right knee: Secondary | ICD-10-CM | POA: Diagnosis not present

## 2020-11-02 DIAGNOSIS — M6281 Muscle weakness (generalized): Secondary | ICD-10-CM | POA: Diagnosis not present

## 2020-11-02 DIAGNOSIS — M25661 Stiffness of right knee, not elsewhere classified: Secondary | ICD-10-CM | POA: Diagnosis not present

## 2020-11-03 DIAGNOSIS — L820 Inflamed seborrheic keratosis: Secondary | ICD-10-CM | POA: Diagnosis not present

## 2020-11-03 DIAGNOSIS — L821 Other seborrheic keratosis: Secondary | ICD-10-CM | POA: Diagnosis not present

## 2020-11-04 DIAGNOSIS — M25561 Pain in right knee: Secondary | ICD-10-CM | POA: Diagnosis not present

## 2020-11-04 DIAGNOSIS — M25661 Stiffness of right knee, not elsewhere classified: Secondary | ICD-10-CM | POA: Diagnosis not present

## 2020-11-04 DIAGNOSIS — R262 Difficulty in walking, not elsewhere classified: Secondary | ICD-10-CM | POA: Diagnosis not present

## 2020-11-04 DIAGNOSIS — M6281 Muscle weakness (generalized): Secondary | ICD-10-CM | POA: Diagnosis not present

## 2020-11-09 DIAGNOSIS — M6281 Muscle weakness (generalized): Secondary | ICD-10-CM | POA: Diagnosis not present

## 2020-11-09 DIAGNOSIS — M25661 Stiffness of right knee, not elsewhere classified: Secondary | ICD-10-CM | POA: Diagnosis not present

## 2020-11-09 DIAGNOSIS — M25561 Pain in right knee: Secondary | ICD-10-CM | POA: Diagnosis not present

## 2020-11-09 DIAGNOSIS — R262 Difficulty in walking, not elsewhere classified: Secondary | ICD-10-CM | POA: Diagnosis not present

## 2020-11-11 DIAGNOSIS — M25561 Pain in right knee: Secondary | ICD-10-CM | POA: Diagnosis not present

## 2020-11-11 DIAGNOSIS — M25661 Stiffness of right knee, not elsewhere classified: Secondary | ICD-10-CM | POA: Diagnosis not present

## 2020-11-11 DIAGNOSIS — R262 Difficulty in walking, not elsewhere classified: Secondary | ICD-10-CM | POA: Diagnosis not present

## 2020-11-11 DIAGNOSIS — M6281 Muscle weakness (generalized): Secondary | ICD-10-CM | POA: Diagnosis not present

## 2020-11-14 ENCOUNTER — Other Ambulatory Visit: Payer: Self-pay | Admitting: Family

## 2020-11-16 DIAGNOSIS — M25661 Stiffness of right knee, not elsewhere classified: Secondary | ICD-10-CM | POA: Diagnosis not present

## 2020-11-16 DIAGNOSIS — R262 Difficulty in walking, not elsewhere classified: Secondary | ICD-10-CM | POA: Diagnosis not present

## 2020-11-16 DIAGNOSIS — M25561 Pain in right knee: Secondary | ICD-10-CM | POA: Diagnosis not present

## 2020-11-16 DIAGNOSIS — M6281 Muscle weakness (generalized): Secondary | ICD-10-CM | POA: Diagnosis not present

## 2020-11-18 DIAGNOSIS — R262 Difficulty in walking, not elsewhere classified: Secondary | ICD-10-CM | POA: Diagnosis not present

## 2020-11-18 DIAGNOSIS — M6281 Muscle weakness (generalized): Secondary | ICD-10-CM | POA: Diagnosis not present

## 2020-11-18 DIAGNOSIS — M25661 Stiffness of right knee, not elsewhere classified: Secondary | ICD-10-CM | POA: Diagnosis not present

## 2020-11-18 DIAGNOSIS — M25561 Pain in right knee: Secondary | ICD-10-CM | POA: Diagnosis not present

## 2020-11-28 ENCOUNTER — Other Ambulatory Visit: Payer: Self-pay | Admitting: Family

## 2020-12-13 ENCOUNTER — Other Ambulatory Visit: Payer: Self-pay | Admitting: Family

## 2020-12-13 DIAGNOSIS — M25561 Pain in right knee: Secondary | ICD-10-CM | POA: Diagnosis not present

## 2020-12-14 NOTE — Telephone Encounter (Signed)
Please contact pt to schedule a follow up appointment.  °

## 2020-12-20 NOTE — Telephone Encounter (Signed)
Patient scheduled for follow up 12-24-20

## 2020-12-21 DIAGNOSIS — K219 Gastro-esophageal reflux disease without esophagitis: Secondary | ICD-10-CM | POA: Diagnosis not present

## 2020-12-21 DIAGNOSIS — R059 Cough, unspecified: Secondary | ICD-10-CM | POA: Diagnosis not present

## 2020-12-24 ENCOUNTER — Ambulatory Visit (INDEPENDENT_AMBULATORY_CARE_PROVIDER_SITE_OTHER): Payer: Medicare Other | Admitting: Family

## 2020-12-24 ENCOUNTER — Encounter: Payer: Self-pay | Admitting: Family

## 2020-12-24 VITALS — BP 120/78 | HR 73 | Temp 98.2°F | Resp 18 | Ht 67.5 in | Wt 142.6 lb

## 2020-12-24 DIAGNOSIS — K219 Gastro-esophageal reflux disease without esophagitis: Secondary | ICD-10-CM

## 2020-12-24 DIAGNOSIS — R0683 Snoring: Secondary | ICD-10-CM | POA: Insufficient documentation

## 2020-12-24 DIAGNOSIS — M65341 Trigger finger, right ring finger: Secondary | ICD-10-CM | POA: Diagnosis not present

## 2020-12-24 DIAGNOSIS — M8580 Other specified disorders of bone density and structure, unspecified site: Secondary | ICD-10-CM

## 2020-12-24 DIAGNOSIS — I10 Essential (primary) hypertension: Secondary | ICD-10-CM | POA: Diagnosis not present

## 2020-12-24 DIAGNOSIS — F419 Anxiety disorder, unspecified: Secondary | ICD-10-CM

## 2020-12-24 LAB — COMPREHENSIVE METABOLIC PANEL
ALT: 11 U/L (ref 0–35)
AST: 15 U/L (ref 0–37)
Albumin: 4.7 g/dL (ref 3.5–5.2)
Alkaline Phosphatase: 99 U/L (ref 39–117)
BUN: 14 mg/dL (ref 6–23)
CO2: 30 mEq/L (ref 19–32)
Calcium: 9.4 mg/dL (ref 8.4–10.5)
Chloride: 100 mEq/L (ref 96–112)
Creatinine, Ser: 0.8 mg/dL (ref 0.40–1.20)
GFR: 76.12 mL/min (ref >60.00)
Glucose, Bld: 94 mg/dL (ref 70–99)
Potassium: 3.8 mEq/L (ref 3.5–5.1)
Sodium: 139 mEq/L (ref 135–145)
Total Bilirubin: 2.5 mg/dL — ABNORMAL HIGH (ref 0.2–1.2)
Total Protein: 6.7 g/dL (ref 6.0–8.3)

## 2020-12-24 NOTE — Assessment & Plan Note (Signed)
Stable, notes more vivid dreams on lexapro but she is pleased with the way it is controlling her anxiety and does not wish to discontinue.

## 2020-12-24 NOTE — Assessment & Plan Note (Signed)
BP Readings from Last 3 Encounters:  12/24/20 120/78  08/19/20 122/84  12/15/19 139/80   Stable, continue amlodipine 5mg .

## 2020-12-24 NOTE — Patient Instructions (Signed)
Please complete lab work prior to leaving. You may use tums as needed for heartburn symptoms. Continue pepcid 20mg  once daily.  Continue omeprazole 20mg  once daily as needed.

## 2020-12-24 NOTE — Assessment & Plan Note (Signed)
Mild, not interfering with use.  She has an orthopedist and plans to discuss further with him.

## 2020-12-24 NOTE — Assessment & Plan Note (Addendum)
Pt is advised as follows:  You may use tums as needed for heartburn symptoms. Continue pepcid 20mg  once daily.  Continue omeprazole 20mg  once daily as needed.

## 2020-12-24 NOTE — Assessment & Plan Note (Signed)
She is concerned about the possibility of OSA. Will refer to sleep specialist for further evaluation.

## 2020-12-24 NOTE — Assessment & Plan Note (Signed)
Last bone density 9/22. Continue caltrate.

## 2020-12-24 NOTE — Progress Notes (Signed)
Subjective:   By signing my name below, I, Zite Okoli, attest that this documentation has been prepared under the direction and in the presence of Sandford Craze, NP  12/24/2020     Patient ID: Megan Middleton, female    DOB: 1955/10/15, 65 y.o.   MRN: [id number]  Chief Complaint  Patient presents with   Hypertension   Anxiety   Follow-up    HPI Patient is in today for an office visit.  Hypertension- She manages her blood pressure with 5 mg amlodipine and is doing well on it. Her blood pressure is stable at this visit. BP Readings from Last 3 Encounters:  12/24/20 120/78  08/19/20 122/84  12/15/19 139/80    Anxiety - She reports that since she started 20 mg lexapro, she has been having very vivid dreams. She also reports that when she wakes up, she feels like she has been running all night and does not wake up feeling well-rested. She thinks the benefits outweigh the side effects and does not want to stop it. She is also concerned about sleep apnea and would like a sleep study.  Fingers- She also reports hat since she went to a nail salon, she has been finding it hard to stretch her fingers when she wakes up in the morning.  GERD- Her throat was feeling very congested and there was occasional coughing and sputum production. She went to see an allergy specialist and was told it might be due to her acid reflux. She was referred to a GI and was prescribed 20 mg Pepcid and 20 mg omeprazole.She is worried about the side effects of omeprazole and takes it rarely. She also adds that the Pepcid does not alleviate the burning in her throat so she has to use omeprazole sometimes which relieves the burning.   Past Medical History:  Diagnosis Date   Allergy    seasonal   Gilberts syndrome    Gynecological examination    sees Dr. Huel Cote    Hyperlipidemia    Hypertension    Osteopenia 10/02/2020   Squamous cell carcinoma    Sees Derm Royden Purl   Tubular adenoma of  colon 2015    Past Surgical History:  Procedure Laterality Date   collar bone surgery     jan,2020   COLONOSCOPY  10-08-13   per Dr. Russella Dar, adenomatous polyps, repear in 5 yrs   TUBAL LIGATION      Family History  Problem Relation Age of Onset   Hypertension Father    Breast cancer Sister    Lymphoma Mother    Leukemia Mother    Colon cancer Neg Hx    Esophageal cancer Neg Hx    Rectal cancer Neg Hx    Stomach cancer Neg Hx    Colon polyps Neg Hx     Social History   Socioeconomic History   Marital status: Widowed    Spouse name: Not on file   Number of children: Not on file   Years of education: Not on file   Highest education level: Not on file  Occupational History   Not on file  Tobacco Use   Smoking status: Never   Smokeless tobacco: Never  Vaping Use   Vaping Use: Not on file  Substance and Sexual Activity   Alcohol use: Yes    Alcohol/week: 0.0 standard drinks    Comment: occ / social   Drug use: No   Sexual activity: Not Currently  Other Topics Concern  Not on file  Social History Narrative   Originally from [place]   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[name]   Completed 2 year community college   Has one daughter- [name]   2 grandchildren   Enjoys volunteering, GYM, crafts, reading   Social Determinants of Radio broadcast assistant Strain: Low Risk    Difficulty of Paying Living Expenses: Not hard at [employer] Insecurity: No Food Insecurity   Worried About Charity fundraiser in the Last Year: Never true   Arboriculturist in the Last Year: Never true  Transportation Needs: No Transportation Needs   Lack of Transportation (Medical): No   Lack of Transportation (Non-Medical): No  Physical Activity: Sufficiently Active   Days of Exercise per Week: 7 days   Minutes of Exercise per Session: 40 min  Stress: No Stress Concern Present   Feeling of Stress : Not at all  Social Connections: Moderately Integrated   Frequency of  Communication with Friends and Family: More than three times a week   Frequency of Social Gatherings with Friends and Family: More than three times a week   Attends Religious Services: More than 4 times per year   Active Member of [name] or Organizations: Yes   Attends Archivist Meetings: 1 to 4 times per year   Marital Status: Widowed  Human resources officer Violence: Not At Risk   Fear of Current or Ex-Partner: No   Emotionally Abused: No   Physically Abused: No   Sexually Abused: No    Outpatient Medications Prior to Visit  Medication Sig Dispense Refill   amLODipine (NORVASC) 5 MG tablet TAKE 1 TABLET (5 MG TOTAL) BY MOUTH DAILY. 90 tablet 0   calcium carbonate (OS-CAL) 600 MG TABS Take 600 mg by mouth daily. With vitamin d     escitalopram (LEXAPRO) 20 MG tablet TAKE 1 TABLET BY MOUTH EVERY DAY 90 tablet 0   famotidine (PEPCID) 20 MG tablet Take 20 mg by mouth at bedtime.     NON FORMULARY JUICE PLUS     omeprazole (PRILOSEC) 20 MG capsule TAKE 1 CAPSULE BY MOUTH EVERY DAY 90 capsule 1   No facility-administered medications prior to visit.    Allergies  Allergen Reactions   Doxycycline     GI side effects   Morphine Other (See Comments) and Nausea Only    Low blood pressure    Review of Systems  Constitutional:  Negative for fever.  HENT:  Negative for ear pain and hearing loss.        (-)nystagmus (-)adenopathy  Eyes:  Negative for blurred vision.  Respiratory:  Negative for cough, shortness of breath and wheezing.   Cardiovascular:  Negative for chest pain and leg swelling.  Gastrointestinal:  Negative for blood in stool, diarrhea, nausea and vomiting.  Genitourinary:  Negative for dysuria and frequency.  Musculoskeletal:  Negative for joint pain and myalgias.  Skin:  Negative for rash.  Neurological:  Negative for headaches.  Psychiatric/Behavioral:  Negative for depression. The patient is not nervous/anxious.       Objective:    Physical  Exam Constitutional:      General: She is not in acute distress.    Appearance: Normal appearance. She is not ill-appearing.  HENT:     Head: Normocephalic and atraumatic.     Right Ear: External ear normal.     Left Ear: External ear normal.  Eyes:     Extraocular Movements:  Extraocular movements intact.     Pupils: Pupils are equal, round, and reactive to light.  Cardiovascular:     Rate and Rhythm: Normal rate and regular rhythm.     Pulses: Normal pulses.     Heart sounds: Normal heart sounds. No murmur heard. Pulmonary:     Effort: Pulmonary effort is normal. No respiratory distress.     Breath sounds: Normal breath sounds. No wheezing or rhonchi.  Abdominal:     General: Bowel sounds are normal. There is no distension.     Palpations: Abdomen is soft.     Tenderness: There is no abdominal tenderness. There is no guarding or rebound.  Musculoskeletal:     Cervical back: Neck supple.  Lymphadenopathy:     Cervical: No cervical adenopathy.  Skin:    General: Skin is warm and dry.  Neurological:     Mental Status: She is alert and oriented to person, place, and time.  Psychiatric:        Behavior: Behavior normal.        Judgment: Judgment normal.    BP 120/78 (BP Location: Right Arm, Patient Position: Sitting, Cuff Size: Normal)   Pulse 73   Temp 98.2 F (36.8 C) (Oral)   Resp 18   Ht 5' 7.5" (1.715 m)   Wt 142 lb 9.6 oz (64.7 kg)   SpO2 98%   BMI 22.00 kg/m  Wt Readings from Last 3 Encounters:  12/24/20 142 lb 9.6 oz (64.7 kg)  08/19/20 142 lb (64.4 kg)  12/15/19 137 lb (62.1 kg)    Diabetic Foot Exam - Simple   No data filed    Lab Results  Component Value Date   WBC 5.4 06/29/2020   HGB 14.4 06/29/2020   HCT 42.2 06/29/2020   PLT 238.0 06/29/2020   GLUCOSE 89 06/29/2020   CHOL 235 (H) 06/29/2020   TRIG 116.0 06/29/2020   HDL 65.20 06/29/2020   LDLDIRECT 110.0 07/09/2018   LDLCALC 147 (H) 06/29/2020   ALT 10 06/29/2020   AST 14 06/29/2020   NA  142 06/29/2020   K 4.0 06/29/2020   CL 101 06/29/2020   CREATININE 0.79 06/29/2020   BUN 15 06/29/2020   CO2 30 06/29/2020   TSH 1.92 06/29/2020   HGBA1C 5.3 10/07/2019    Lab Results  Component Value Date   TSH 1.92 06/29/2020   Lab Results  Component Value Date   WBC 5.4 06/29/2020   HGB 14.4 06/29/2020   HCT 42.2 06/29/2020   MCV 92.1 06/29/2020   PLT 238.0 06/29/2020   Lab Results  Component Value Date   NA 142 06/29/2020   K 4.0 06/29/2020   CO2 30 06/29/2020   GLUCOSE 89 06/29/2020   BUN 15 06/29/2020   CREATININE 0.79 06/29/2020   BILITOT 2.3 (H) 06/29/2020   ALKPHOS 105 06/29/2020   AST 14 06/29/2020   ALT 10 06/29/2020   PROT 6.8 06/29/2020   ALBUMIN 4.7 06/29/2020   CALCIUM 9.6 06/29/2020   GFR 77.55 06/29/2020   Lab Results  Component Value Date   CHOL 235 (H) 06/29/2020   Lab Results  Component Value Date   HDL 65.20 06/29/2020   Lab Results  Component Value Date   LDLCALC 147 (H) 06/29/2020   Lab Results  Component Value Date   TRIG 116.0 06/29/2020   Lab Results  Component Value Date   CHOLHDL 4 06/29/2020   Lab Results  Component Value Date   HGBA1C 5.3 10/07/2019  Assessment & Plan:   Problem List Items Addressed This Visit       Unprioritized   Trigger ring finger of right hand    Mild, not interfering with use.  She has an orthopedist and plans to discuss further with him.       Snoring - Primary    She is concerned about the possibility of OSA. Will refer to sleep specialist for further evaluation.       Relevant Orders   Ambulatory referral to Pulmonology   Osteopenia    Last bone density 9/22. Continue caltrate.        HTN (hypertension)    BP Readings from Last 3 Encounters:  12/24/20 120/78  08/19/20 122/84  12/15/19 139/80  Stable, continue amlodipine 5mg .       Relevant Orders   Comp Met (CMET)   GERD (gastroesophageal reflux disease)    Pt is advised as follows:  You may use tums as needed  for heartburn symptoms. Continue pepcid 20mg  once daily.  Continue omeprazole 20mg  once daily as needed.       Anxiety    Stable, notes more vivid dreams on lexapro but she is pleased with the way it is controlling her anxiety and does not wish to discontinue.        No orders of the defined types were placed in this encounter.   I,Zite Okoli,acting as a Education administrator for [employer], NP.,have documented all relevant documentation on the behalf of Nance Pear, NP,as directed by  Nance Pear, NP while in the presence of Nance Pear, NP.   I, [name], NP, personally preformed the services described in this documentation.  All medical record entries made by the scribe were at my direction and in my presence.  I have reviewed the chart and discharge instructions (if applicable) and agree that the record reflects my personal performance and is accurate and complete. 12/24/2020

## 2021-01-19 DIAGNOSIS — Z01419 Encounter for gynecological examination (general) (routine) without abnormal findings: Secondary | ICD-10-CM | POA: Diagnosis not present

## 2021-01-23 DIAGNOSIS — C4491 Basal cell carcinoma of skin, unspecified: Secondary | ICD-10-CM

## 2021-01-23 HISTORY — DX: Basal cell carcinoma of skin, unspecified: C44.91

## 2021-02-07 DIAGNOSIS — X32XXXS Exposure to sunlight, sequela: Secondary | ICD-10-CM | POA: Diagnosis not present

## 2021-02-07 DIAGNOSIS — Z859 Personal history of malignant neoplasm, unspecified: Secondary | ICD-10-CM | POA: Diagnosis not present

## 2021-02-07 DIAGNOSIS — L570 Actinic keratosis: Secondary | ICD-10-CM | POA: Diagnosis not present

## 2021-02-07 DIAGNOSIS — L821 Other seborrheic keratosis: Secondary | ICD-10-CM | POA: Diagnosis not present

## 2021-02-07 DIAGNOSIS — L814 Other melanin hyperpigmentation: Secondary | ICD-10-CM | POA: Diagnosis not present

## 2021-02-07 DIAGNOSIS — L578 Other skin changes due to chronic exposure to nonionizing radiation: Secondary | ICD-10-CM | POA: Diagnosis not present

## 2021-02-07 DIAGNOSIS — D1801 Hemangioma of skin and subcutaneous tissue: Secondary | ICD-10-CM | POA: Diagnosis not present

## 2021-02-07 DIAGNOSIS — W908XXS Exposure to other nonionizing radiation, sequela: Secondary | ICD-10-CM | POA: Diagnosis not present

## 2021-02-08 DIAGNOSIS — M65341 Trigger finger, right ring finger: Secondary | ICD-10-CM | POA: Diagnosis not present

## 2021-02-23 ENCOUNTER — Ambulatory Visit (INDEPENDENT_AMBULATORY_CARE_PROVIDER_SITE_OTHER): Payer: Medicare Other | Admitting: Pulmonary Disease

## 2021-02-23 ENCOUNTER — Encounter: Payer: Self-pay | Admitting: Pulmonary Disease

## 2021-02-23 ENCOUNTER — Other Ambulatory Visit: Payer: Self-pay

## 2021-02-23 VITALS — BP 122/78 | HR 69 | Temp 98.2°F | Ht 67.5 in | Wt 143.6 lb

## 2021-02-23 DIAGNOSIS — R0683 Snoring: Secondary | ICD-10-CM | POA: Diagnosis not present

## 2021-02-23 NOTE — Progress Notes (Signed)
[facility]  Chief Complaint  Patient presents with   Consult    Sleep Consult-snoring, not feeling rested in am.    Past Surgical History:  She  has a past surgical history that includes Colonoscopy (10-08-13); Tubal ligation; and collar bone surgery.  Past Medical History:  Rosanna Randy syndrome, Allergies, HLD, HTN, Osteopenia, Colon polyps  Constitutional:  BP 122/78 (BP Location: Left Arm, Cuff Size: Normal)    Pulse 69    Temp 98.2 F (36.8 C) (Temporal)    Ht 5' 7.5" (1.715 m)    Wt 143 lb 9.6 oz (65.1 kg)    SpO2 97%    BMI 22.16 kg/m   Brief Summary:  Megan Middleton is a 66 y.o. female with snoring.      Subjective:   She has known about her snoring for years.  She wakes up hearing herself snore, and wakes up feeling like she can't breath. She feels like she is running all night long and never feels rested after sleeping.  She uses a mouth guard at night for teeth grinding.  She gets leg cramps at night at least 3 times per week.  She has to fight through feeling sleepy while watching TV.  She goes to sleep at 10 pm.  She falls asleep after a while.  She wakes up 1 or 2 times to use the bathroom.  She gets out of bed at 7 am.  She feels tired in the morning.  She denies morning headache.  She does not use anything to help her fall sleep. She drinks 3 or 4 cups of coffee in the morning.  She denies sleep walking, sleep talking, or nightmares.  There is no history of restless legs.  She denies sleep hallucinations, sleep paralysis, or cataplexy.  The Epworth score is 1 out of 24.   Physical Exam:   Appearance - well kempt   ENMT - no sinus tenderness, no oral exudate, no LAN, Mallampati 2 airway, no stridor, low laying soft palate, decreased AP diameter  Respiratory - equal breath sounds bilaterally, no wheezing or rales  CV - s1s2 regular rate and rhythm, no murmurs  Ext - no clubbing, no edema  Skin - no  rashes  Psych - normal mood and affect   Sleep Tests:    Social History:  She  reports that she has never smoked. She has never used smokeless tobacco. She reports current alcohol use. She reports that she does not use drugs.  Family History:  Her family history includes Breast cancer in her sister; Hypertension in her father; Leukemia in her mother; Lymphoma in her mother.    Discussion:  She has snoring, sleep disruption, apnea, and daytime sleepiness.  She has history of hypertension.  I am concerned she could have obstructive sleep apnea.  Assessment/Plan:   Snoring with excessive daytime sleepiness. - will need to arrange for a home sleep study  Obesity. - discussed how weight can impact sleep and risk for sleep disordered breathing - discussed options to assist with weight loss: combination of diet modification, cardiovascular and strength training exercises  Cardiovascular risk. - had an extensive discussion regarding the adverse health consequences related to untreated sleep disordered breathing - specifically discussed the risks for hypertension, coronary artery disease, cardiac dysrhythmias, cerebrovascular disease, and diabetes - lifestyle modification discussed  Safe driving practices. - discussed how sleep disruption can increase risk of accidents, particularly when driving - safe driving practices were  discussed  Therapies for obstructive sleep apnea. - if the sleep study shows significant sleep apnea, then various therapies for treatment were reviewed: CPAP, oral appliance, and surgical interventions  Time Spent Involved in Patient Care on Day of Examination:  36 minutes  Follow up:   Patient Instructions  Will arrange for home sleep study Will call to arrange for follow up after sleep study reviewed   Medication List:   Allergies as of 02/23/2021       Reactions   Doxycycline    GI side effects   Morphine Other (See Comments), Nausea Only   Low  blood pressure        Medication List        Accurate as of February 23, 2021  9:49 AM. If you have any questions, ask your nurse or doctor.          amLODipine 5 MG tablet Commonly known as: NORVASC TAKE 1 TABLET (5 MG TOTAL) BY MOUTH DAILY.   calcium carbonate 600 MG Tabs tablet Commonly known as: OS-CAL Take 600 mg by mouth daily. With vitamin d   escitalopram 20 MG tablet Commonly known as: LEXAPRO TAKE 1 TABLET BY MOUTH EVERY DAY   famotidine 20 MG tablet Commonly known as: PEPCID Take 20 mg by mouth at bedtime.   NON FORMULARY JUICE PLUS   omeprazole 20 MG capsule Commonly known as: PRILOSEC TAKE 1 CAPSULE BY MOUTH EVERY DAY        Signature:  Chesley Mires, MD [place] Pager - [phone] 02/23/2021, 9:49 AM

## 2021-02-23 NOTE — Patient Instructions (Signed)
Will arrange for home sleep study Will call to arrange for follow up after sleep study reviewed  

## 2021-03-12 ENCOUNTER — Other Ambulatory Visit: Payer: Self-pay | Admitting: Family

## 2021-04-20 DIAGNOSIS — L570 Actinic keratosis: Secondary | ICD-10-CM | POA: Diagnosis not present

## 2021-04-20 DIAGNOSIS — D485 Neoplasm of uncertain behavior of skin: Secondary | ICD-10-CM | POA: Diagnosis not present

## 2021-04-20 DIAGNOSIS — W908XXS Exposure to other nonionizing radiation, sequela: Secondary | ICD-10-CM | POA: Diagnosis not present

## 2021-04-20 DIAGNOSIS — C44722 Squamous cell carcinoma of skin of right lower limb, including hip: Secondary | ICD-10-CM | POA: Diagnosis not present

## 2021-04-20 DIAGNOSIS — L578 Other skin changes due to chronic exposure to nonionizing radiation: Secondary | ICD-10-CM | POA: Diagnosis not present

## 2021-04-21 ENCOUNTER — Ambulatory Visit: Payer: Medicare Other

## 2021-04-21 DIAGNOSIS — G4733 Obstructive sleep apnea (adult) (pediatric): Secondary | ICD-10-CM | POA: Diagnosis not present

## 2021-04-21 DIAGNOSIS — R0683 Snoring: Secondary | ICD-10-CM

## 2021-04-22 ENCOUNTER — Telehealth: Payer: Self-pay | Admitting: Pulmonary Disease

## 2021-04-22 DIAGNOSIS — G4733 Obstructive sleep apnea (adult) (pediatric): Secondary | ICD-10-CM | POA: Diagnosis not present

## 2021-04-22 NOTE — Telephone Encounter (Signed)
Called and went over results with patient she voiced understanding. Scheduled an appt. For patient in [place] office. Nothing further needed at this time.  ?

## 2021-04-22 NOTE — Telephone Encounter (Signed)
HST 04/21/21 >> AHI 39.9, SpO2 low 79% ? ?Please inform her that her sleep study shows severe obstructive sleep apnea.  Please arrange for ROV with me or NP to discuss treatment options. ? ? ? ?

## 2021-04-27 DIAGNOSIS — L570 Actinic keratosis: Secondary | ICD-10-CM | POA: Diagnosis not present

## 2021-05-10 ENCOUNTER — Encounter: Payer: Self-pay | Admitting: Adult Health

## 2021-05-10 ENCOUNTER — Ambulatory Visit (INDEPENDENT_AMBULATORY_CARE_PROVIDER_SITE_OTHER): Payer: Medicare Other | Admitting: Adult Health

## 2021-05-10 DIAGNOSIS — G4733 Obstructive sleep apnea (adult) (pediatric): Secondary | ICD-10-CM

## 2021-05-10 NOTE — Progress Notes (Signed)
? ?'@Patient'$  ID: Megan Middleton, female    DOB: [birth year], 66 y.o.   MRN: [id number] ? ?Chief Complaint  ?Patient presents with  ? Follow-up  ? ? ?Referring provider: ?[name], NP ? ?HPI: ?67 year old female seen for sleep consult February 23, 2021 found to have severe sleep apnea ? ?TEST/EVENTS :  ?HST 04/21/21 >> AHI 39.9, SpO2 low 79% ? ?05/10/2021 Follow up : OSA ?Patient presents for a 27-monthfollow-up.  Patient was seen for sleep consult February 23, 2021.  She was having daytime sleepiness and snoring.  Also was having restless sleep.  Patient was set up for home sleep study that was completed on April 21, 2021 this showed severe sleep apnea with AHI at 39.9/hour and SPO2 low at 79%.  We discussed her sleep study results.  Patient education was given on sleep apnea.  We went over treatment options including healthy weight, oral appliance and CPAP.  Patient would like to proceed with CPAP therapy.  Patient education was given on CPAP care. ? ? ?Allergies  ?Allergen Reactions  ? Doxycycline   ?  GI side effects  ? Morphine Other (See Comments) and Nausea Only  ?  Low blood pressure  ? ? ?Immunization History  ?Administered Date(s) Administered  ? Fluad Quad(high Dose 65+) 10/09/2018, 10/07/2019, 10/31/2020  ? Influenza Split 11/09/2010, 11/10/2011, 11/05/2012  ? Influenza, High Dose Seasonal PF 10/30/2016  ? Influenza,inj,Quad PF,6+ Mos 10/16/2017  ? Influenza-Unspecified 11/06/2013, 11/26/2014, 10/27/2015  ? PFIZER(Purple Top)SARS-COV-2 Vaccination 02/12/2019, 03/05/2019, 11/27/2019  ? Pneumococcal Conjugate-13 07/09/2018  ? Rabies Immune Globulin 11/12/2019, 11/15/2019, 11/19/2019, 11/26/2019  ? Tdap 09/24/2010, 11/12/2019  ? Zoster Recombinat (Shingrix) 10/16/2017, 12/24/2017  ? Zoster, Live 04/27/2014  ? ? ?Past Medical History:  ?Diagnosis Date  ? Allergy   ? seasonal  ? Gilberts syndrome   ? Gynecological examination   ? sees Dr. KPaula Compton  ? Hyperlipidemia   ? Hypertension   ?  Osteopenia 10/02/2020  ? Squamous cell carcinoma   ? Sees Derm ATheora Gianotti ? Tubular adenoma of colon 2015  ? ? ?Tobacco History: ?Social History  ? ?Tobacco Use  ?Smoking Status Never  ?Smokeless Tobacco Never  ? ?Counseling given: Not Answered ? ? ?Outpatient Medications Prior to Visit  ?Medication Sig Dispense Refill  ? amLODipine (NORVASC) 5 MG tablet TAKE 1 TABLET (5 MG TOTAL) BY MOUTH DAILY. 90 tablet 0  ? calcium carbonate (OS-CAL) 600 MG TABS Take 600 mg by mouth daily. With vitamin d    ? escitalopram (LEXAPRO) 20 MG tablet TAKE 1 TABLET BY MOUTH EVERY DAY 90 tablet 0  ? famotidine (PEPCID) 20 MG tablet Take 20 mg by mouth at bedtime.    ? NON FORMULARY JUICE PLUS    ? omeprazole (PRILOSEC) 20 MG capsule TAKE 1 CAPSULE BY MOUTH EVERY DAY 90 capsule 1  ? ?No facility-administered medications prior to visit.  ? ? ? ?Review of Systems:  ? ?Constitutional:   No  weight loss, night sweats,  Fevers, chills,  ?+fatigue, or  lassitude. ? ?HEENT:   No headaches,  Difficulty swallowing,  Tooth/dental problems, or  Sore throat,  ?              No sneezing, itching, ear ache, nasal congestion, post nasal drip,  ? ?CV:  No chest pain,  Orthopnea, PND, swelling in lower extremities, anasarca, dizziness, palpitations, syncope.  ? ?GI  No heartburn, indigestion, abdominal pain, nausea, vomiting, diarrhea, change in bowel habits, loss of  appetite, bloody stools.  ? ?Resp: No shortness of breath with exertion or at rest.  No excess mucus, no productive cough,  No non-productive cough,  No coughing up of blood.  No change in color of mucus.  No wheezing.  No chest wall deformity ? ?Skin: no rash or lesions. ? ?GU: no dysuria, change in color of urine, no urgency or frequency.  No flank pain, no hematuria  ? ?MS:  No joint pain or swelling.  No decreased range of motion.  No back pain. ? ? ? ?Physical Exam ? ?BP 106/60 (BP Location: Left Arm, Patient Position: Sitting, Cuff Size: Normal)   Pulse 81   Temp (!) 97.5 ?F (36.4  ?C) (Oral)   Ht '5\' 7"'$  (1.702 m)   Wt 144 lb 6.4 oz (65.5 kg)   SpO2 100%   BMI 22.62 kg/m?  ? ?GEN: A/Ox3; pleasant , NAD, well nourished  ?  ?HEENT:  [place]/AT,   NOSE-clear, THROAT-clear, no lesions, no postnasal drip or exudate noted.  ?Class 2 MP airway  ? ?NECK:  Supple w/ fair ROM; no JVD; normal carotid impulses w/o bruits; no thyromegaly or nodules palpated; no lymphadenopathy.   ? ?RESP  Clear  P & A; w/o, wheezes/ rales/ or rhonchi. no accessory muscle use, no dullness to percussion ? ?CARD:  RRR, no m/r/g, no peripheral edema, pulses intact, no cyanosis or clubbing. ? ?GI:   Soft & nt; nml bowel sounds; no organomegaly or masses detected.  ? ?Musco: Warm bil, no deformities or joint swelling noted.  ? ?Neuro: alert, no focal deficits noted.   ? ?Skin: Warm, no lesions or rashes ? ? ? ?Lab Results: ? ? ?BNP ?No results found for: BNP ? ?ProBNP ?No results found for: PROBNP ? ?Imaging: ?No results found. ? ? ? ?   ? View : No data to display.  ?  ?  ?  ? ? ?No results found for: NITRICOXIDE ? ? ? ? ? ?Assessment & Plan:  ? ?OSA (obstructive sleep apnea) ?Severe OSA - patient education on OSA  ?- discussed how weight can impact sleep and risk for sleep disordered breathing ?- discussed options to assist with weight loss: combination of diet modification, cardiovascular and strength training exercises ?  ?- had an extensive discussion regarding the adverse health consequences related to untreated sleep disordered breathing ?- specifically discussed the risks for hypertension, coronary artery disease, cardiac dysrhythmias, cerebrovascular disease, and diabetes ?- lifestyle modification discussed ?  ?- discussed how sleep disruption can increase risk of accidents, particularly when driving ?- safe driving practices were discussed ?   ?Begin Auto CPAP 5 to 15 cm H2O.  ? ?Plan  ?Patient Instructions  ?Begin CPAP at bedtime.  Goal was to wear CPAP all night long for at least 6 hours or more. ?Continue to have  healthy weight ?Activity as tolerated ?Do not drive if sleepy ?May try dream wear nasal mask .  ?Follow-up in 3 months with Dr. Halford Chessman or Quenten Nawaz NP and As needed   ? ?  ? ? ? ? ?Rexene Edison, NP ?05/10/2021 ? ?

## 2021-05-10 NOTE — Progress Notes (Signed)
Reviewed and agree with assessment/plan. ? ? ?Chesley Mires, MD ?[place] ?05/10/2021, 1:09 PM ?Pager:  [phone] ? ?

## 2021-05-10 NOTE — Patient Instructions (Addendum)
Begin CPAP at bedtime.  Goal was to wear CPAP all night long for at least 6 hours or more. ?Continue to have healthy weight ?Activity as tolerated ?Do not drive if sleepy ?May try dream wear nasal mask .  ?Follow-up in 3 months with Dr. Halford Chessman or Ferdinando Lodge NP and As needed   ? ?

## 2021-05-10 NOTE — Addendum Note (Signed)
Addended by: Vanessa Barbara on: 05/10/2021 11:56 AM ? ? Modules accepted: Orders ? ?

## 2021-05-10 NOTE — Assessment & Plan Note (Signed)
Severe OSA - patient education on OSA  ?- discussed how weight can impact sleep and risk for sleep disordered breathing ?- discussed options to assist with weight loss: combination of diet modification, cardiovascular and strength training exercises ?  ?- had an extensive discussion regarding the adverse health consequences related to untreated sleep disordered breathing ?- specifically discussed the risks for hypertension, coronary artery disease, cardiac dysrhythmias, cerebrovascular disease, and diabetes ?- lifestyle modification discussed ?  ?- discussed how sleep disruption can increase risk of accidents, particularly when driving ?- safe driving practices were discussed ?   ?Begin Auto CPAP 5 to 15 cm H2O.  ? ?Plan  ?Patient Instructions  ?Begin CPAP at bedtime.  Goal was to wear CPAP all night long for at least 6 hours or more. ?Continue to have healthy weight ?Activity as tolerated ?Do not drive if sleepy ?May try dream wear nasal mask .  ?Follow-up in 3 months with Dr. Halford Chessman or Maryanna Stuber NP and As needed   ? ?  ? ?

## 2021-05-12 DIAGNOSIS — L564 Polymorphous light eruption: Secondary | ICD-10-CM | POA: Diagnosis not present

## 2021-05-12 DIAGNOSIS — C44722 Squamous cell carcinoma of skin of right lower limb, including hip: Secondary | ICD-10-CM | POA: Diagnosis not present

## 2021-05-16 ENCOUNTER — Ambulatory Visit: Payer: Medicare Other | Admitting: Family

## 2021-05-18 ENCOUNTER — Other Ambulatory Visit: Payer: Self-pay | Admitting: Obstetrics and Gynecology

## 2021-05-18 DIAGNOSIS — Z1231 Encounter for screening mammogram for malignant neoplasm of breast: Secondary | ICD-10-CM

## 2021-05-23 ENCOUNTER — Ambulatory Visit (INDEPENDENT_AMBULATORY_CARE_PROVIDER_SITE_OTHER): Payer: Medicare Other | Admitting: Family

## 2021-05-23 VITALS — BP 136/63 | HR 65 | Temp 98.6°F | Resp 16 | Wt 143.0 lb

## 2021-05-23 DIAGNOSIS — F419 Anxiety disorder, unspecified: Secondary | ICD-10-CM | POA: Diagnosis not present

## 2021-05-23 DIAGNOSIS — I10 Essential (primary) hypertension: Secondary | ICD-10-CM

## 2021-05-23 DIAGNOSIS — G4733 Obstructive sleep apnea (adult) (pediatric): Secondary | ICD-10-CM

## 2021-05-23 DIAGNOSIS — K219 Gastro-esophageal reflux disease without esophagitis: Secondary | ICD-10-CM | POA: Diagnosis not present

## 2021-05-23 NOTE — Assessment & Plan Note (Signed)
Stable on prn omeprazole/pepcid. I suspect that this will improve after she has been on CPAP for a while.  ?

## 2021-05-23 NOTE — Assessment & Plan Note (Signed)
Stable on lexapro.  Continue same . 

## 2021-05-23 NOTE — Progress Notes (Signed)
?  ? ?Subjective:  ? ?By signing my name below, I, Megan Middleton, attest that this documentation has been prepared under the direction and in the presence of [name], NP 05/23/2021  ? ? ? Patient ID: Megan Middleton, female    DOB: 10-Oct-1955, 66 y.o.   MRN: [id number] ? ?Chief Complaint  ?Patient presents with  ? Sleep Apnea  ?  Follow up after sleep study, "have some questions"  ? ? ?HPI ?Patient is in today for an office visit ? ?Sleep apnea- She got a CPAP machine on Friday after a sleep study was conducted. She has some questions because she is not sure how the machine works. She is still getting used to it. She is conscious about her health since she was educated about the effects of sleep apnea. She will like to get an echo.  ? ?Acid reflux- She was diagnosed with acid reflux after going to gastro. She always has phlegm in her throat and thinks it is due to allergies at this time.  She uses 20 mg omeprazole as needed and uses Pepcid more often. ? ?Immunizations- She has 3 Covid-19 vaccines at this time. ? ?Past Medical History:  ?Diagnosis Date  ? Allergy   ? seasonal  ? Gilberts syndrome   ? Gynecological examination   ? sees Dr. [name]   ? Hyperlipidemia   ? Hypertension   ? Osteopenia 10/02/2020  ? Squamous cell carcinoma   ? Sees Derm [name]  ? Tubular adenoma of colon 2015  ? ? ?Past Surgical History:  ?Procedure Laterality Date  ? collar bone surgery    ? jan,2020  ? COLONOSCOPY  10-08-13  ? per Dr. Fuller Plan, adenomatous polyps, repear in 5 yrs  ? TUBAL LIGATION    ? ? ?Family History  ?Problem Relation Age of Onset  ? Hypertension Father   ? Breast cancer Sister   ? Lymphoma Mother   ? Leukemia Mother   ? Colon cancer Neg Hx   ? Esophageal cancer Neg Hx   ? Rectal cancer Neg Hx   ? Stomach cancer Neg Hx   ? Colon polyps Neg Hx   ? ? ?Social History  ? ?Socioeconomic History  ? Marital status: Widowed  ?  Spouse name: Not on file  ? Number of children: Not on file  ? Years of  education: Not on file  ? Highest education level: Not on file  ?Occupational History  ? Not on file  ?Tobacco Use  ? Smoking status: Never  ? Smokeless tobacco: Never  ?Vaping Use  ? Vaping Use: Not on file  ?Substance and Sexual Activity  ? Alcohol use: Yes  ?  Alcohol/week: 0.0 standard drinks  ?  Comment: occ / social  ? Drug use: No  ? Sexual activity: Not Currently  ?Other Topics Concern  ? Not on file  ?Social History Narrative  ? Originally from [place]  ? Retired from Administrator, arts for [name]  ? Completed 2 year community college  ? Has one daughter- Megan Middleton  ? 2 grandchildren  ? Enjoys volunteering, GYM, crafts, reading  ? ?Social Determinants of Health  ? ?Financial Resource Strain: Low Risk   ? Difficulty of Paying Living Expenses: Not hard at all  ?Food Insecurity: No Food Insecurity  ? Worried About Charity fundraiser in the Last Year: Never true  ? Ran Out of Food in the Last Year: Never true  ?Transportation Needs: No Transportation  Needs  ? Lack of Transportation (Medical): No  ? Lack of Transportation (Non-Medical): No  ?Physical Activity: Sufficiently Active  ? Days of Exercise per Week: 7 days  ? Minutes of Exercise per Session: 40 min  ?Stress: No Stress Concern Present  ? Feeling of Stress : Not at all  ?Social Connections: Moderately Integrated  ? Frequency of Communication with Friends and Family: More than three times a week  ? Frequency of Social Gatherings with Friends and Family: More than three times a week  ? Attends Religious Services: More than 4 times per year  ? Active Member of Clubs or Organizations: Yes  ? Attends Archivist Meetings: 1 to 4 times per year  ? Marital Status: Widowed  ?Intimate Partner Violence: Not At Risk  ? Fear of Current or Ex-Partner: No  ? Emotionally Abused: No  ? Physically Abused: No  ? Sexually Abused: No  ? ? ?Outpatient Medications Prior to Visit  ?Medication Sig Dispense Refill  ? amLODipine (NORVASC) 5 MG tablet TAKE  1 TABLET (5 MG TOTAL) BY MOUTH DAILY. 90 tablet 0  ? calcium carbonate (OS-CAL) 600 MG TABS Take 600 mg by mouth daily. With vitamin d    ? escitalopram (LEXAPRO) 20 MG tablet TAKE 1 TABLET BY MOUTH EVERY DAY 90 tablet 0  ? famotidine (PEPCID) 20 MG tablet Take 20 mg by mouth at bedtime.    ? NON FORMULARY JUICE PLUS    ? omeprazole (PRILOSEC) 20 MG capsule TAKE 1 CAPSULE BY MOUTH EVERY DAY 90 capsule 1  ? ?No facility-administered medications prior to visit.  ? ? ?Allergies  ?Allergen Reactions  ? Doxycycline   ?  GI side effects  ? Morphine Other (See Comments) and Nausea Only  ?  Low blood pressure  ? ? ?Review of Systems  ?HENT:  Positive for congestion.   ?Respiratory:  Negative for sputum production and shortness of breath.   ?Cardiovascular:  Negative for chest pain.  ?Gastrointestinal:   ?     (+) acid reflux   ?Endo/Heme/Allergies:  Positive for environmental allergies.  ? ?   ?Objective:  ?  ?Physical Exam ?Constitutional:   ?   General: She is not in acute distress. ?   Appearance: Normal appearance. She is not ill-appearing.  ?HENT:  ?   Head: Normocephalic and atraumatic.  ?   Right Ear: External ear normal.  ?   Left Ear: External ear normal.  ?Cardiovascular:  ?   Rate and Rhythm: Normal rate and regular rhythm.  ?   Pulses: Normal pulses.  ?   Heart sounds: Normal heart sounds. No murmur heard. ?Pulmonary:  ?   Effort: Pulmonary effort is normal. No respiratory distress.  ?   Breath sounds: Normal breath sounds. No wheezing or rhonchi.  ?Skin: ?   General: Skin is warm and dry.  ?Neurological:  ?   Mental Status: She is alert and oriented to person, place, and time.  ?Psychiatric:     ?   Behavior: Behavior normal.     ?   Judgment: Judgment normal.  ? ? ?BP 136/63 (BP Location: Right Arm, Patient Position: Sitting, Cuff Size: Small)   Pulse 65   Temp 98.6 ?F (37 ?C) (Oral)   Resp 16   Wt 143 lb (64.9 kg)   SpO2 100%   BMI 22.40 kg/m?  ?Wt Readings from Last 3 Encounters:  ?05/23/21 143 lb  (64.9 kg)  ?05/10/21 144 lb 6.4 oz (65.5 kg)  ?  02/23/21 143 lb 9.6 oz (65.1 kg)  ? ?   ?Assessment & Plan:  ? ?Problem List Items Addressed This Visit   ? ?  ? Unprioritized  ? OSA (obstructive sleep apnea) - Primary  ?  Sleep study showed severe sleep apnea. She is concerned about prior heart damage that may have occurred due to her years without treatment. Recommended 2D echo for further evaluation. She continues to follow with pulmonology for CPAP management.  ? ?  ?  ? Relevant Orders  ? ECHOCARDIOGRAM COMPLETE  ? HTN (hypertension)  ? Relevant Orders  ? ECHOCARDIOGRAM COMPLETE  ? GERD (gastroesophageal reflux disease)  ?  Stable on prn omeprazole/pepcid. I suspect that this will improve after she has been on CPAP for a while.  ? ?  ?  ? Anxiety  ?  Stable on lexapro. Continue same.  ? ?  ?  ? ? ?No orders of the defined types were placed in this encounter. ? ? ?I,Zite Okoli,acting as a Education administrator for [employer], NP.,have documented all relevant documentation on the behalf of Nance Pear, NP,as directed by  Nance Pear, NP while in the presence of Nance Pear, NP.  ? ?I, [name], NP, personally preformed the services described in this documentation.  All medical record entries made by the scribe were at my direction and in my presence.  I have reviewed the chart and discharge instructions (if applicable) and agree that the record reflects my personal performance and is accurate and complete. 05/23/2021 ?

## 2021-05-23 NOTE — Assessment & Plan Note (Signed)
Sleep study showed severe sleep apnea. She is concerned about prior heart damage that may have occurred due to her years without treatment. Recommended 2D echo for further evaluation. She continues to follow with pulmonology for CPAP management.  ?

## 2021-05-30 ENCOUNTER — Encounter: Payer: Self-pay | Admitting: Family

## 2021-05-31 NOTE — Telephone Encounter (Signed)
Patient is now scheduled for echo 06-02-21 ?

## 2021-06-01 ENCOUNTER — Telehealth: Payer: Self-pay | Admitting: Adult Health

## 2021-06-01 DIAGNOSIS — G4733 Obstructive sleep apnea (adult) (pediatric): Secondary | ICD-10-CM

## 2021-06-01 NOTE — Telephone Encounter (Signed)
Okay to try nasal pillow mask even though she might be a mouth breather.  Most people with sleep apnea are mouth breathers before they start therapy, but then quickly adjust to breathing through their nose once on therapy. ?

## 2021-06-01 NOTE — Telephone Encounter (Signed)
Called and spoke with patient to let her know recs from Dr. Halford Chessman. Order has been placed for her to get nasal pillow mask. Nothing further needed at this time. ?

## 2021-06-01 NOTE — Telephone Encounter (Signed)
Called and spoke with patient who states that she has received CPAP and is not liking the mask that came with it. States skin feels hot and iritated. Wonders if there is an alternative for it. She is a mouth breather and wants to know if she can use the nasal pillows.  ? ? ?Please advise.  ?

## 2021-06-02 ENCOUNTER — Ambulatory Visit (HOSPITAL_BASED_OUTPATIENT_CLINIC_OR_DEPARTMENT_OTHER)
Admission: RE | Admit: 2021-06-02 | Discharge: 2021-06-02 | Disposition: A | Discharge status: Home / Self Care | Payer: Medicare Other | Source: Ambulatory Visit | Attending: Family | Admitting: Family

## 2021-06-02 DIAGNOSIS — G4733 Obstructive sleep apnea (adult) (pediatric): Secondary | ICD-10-CM | POA: Insufficient documentation

## 2021-06-02 DIAGNOSIS — I10 Essential (primary) hypertension: Secondary | ICD-10-CM | POA: Insufficient documentation

## 2021-06-02 LAB — ECHOCARDIOGRAM COMPLETE
AR max vel: 1.85 cm2
AV Area VTI: 1.88 cm2
AV Area mean vel: 1.87 cm2
AV Mean grad: 4 mmHg
AV Peak grad: 7.7 mmHg
Ao pk vel: 1.39 m/s
Area-P 1/2: 4.21 cm2
S' Lateral: 2.9 cm

## 2021-06-02 NOTE — Progress Notes (Signed)
?  Echocardiogram ?2D Echocardiogram has been performed. ? ?Megan Middleton ?06/02/2021, 2:55 PM ?

## 2021-06-05 ENCOUNTER — Other Ambulatory Visit: Payer: Self-pay | Admitting: Family

## 2021-06-07 ENCOUNTER — Telehealth: Payer: Self-pay | Admitting: Adult Health

## 2021-06-08 NOTE — Telephone Encounter (Signed)
Lm for patient.  

## 2021-06-09 DIAGNOSIS — L905 Scar conditions and fibrosis of skin: Secondary | ICD-10-CM | POA: Diagnosis not present

## 2021-06-09 DIAGNOSIS — L570 Actinic keratosis: Secondary | ICD-10-CM | POA: Diagnosis not present

## 2021-06-09 DIAGNOSIS — D485 Neoplasm of uncertain behavior of skin: Secondary | ICD-10-CM | POA: Diagnosis not present

## 2021-06-13 ENCOUNTER — Other Ambulatory Visit: Payer: Self-pay | Admitting: Family

## 2021-06-22 ENCOUNTER — Telehealth: Payer: Self-pay | Admitting: Adult Health

## 2021-06-22 DIAGNOSIS — G4733 Obstructive sleep apnea (adult) (pediatric): Secondary | ICD-10-CM

## 2021-06-23 NOTE — Telephone Encounter (Signed)
Please advise if she can change to a different face mask for CPAP.?

## 2021-06-24 NOTE — Telephone Encounter (Signed)
She can try the dream wear full face. That should help with the mouth breathing , and can sleep on side with this mask.  My note said she was tried on dream wear nasal mask. - Is the mask she is currently using ?

## 2021-06-24 NOTE — Telephone Encounter (Signed)
New mask ordered and I left pt a detailed msg letting her know.

## 2021-06-28 DIAGNOSIS — L570 Actinic keratosis: Secondary | ICD-10-CM | POA: Diagnosis not present

## 2021-06-29 ENCOUNTER — Telehealth: Payer: Self-pay | Admitting: Adult Health

## 2021-06-29 NOTE — Telephone Encounter (Signed)
Nira Conn can you confirm if these were faxed to [facility] or not from [place].   Thank you

## 2021-06-29 NOTE — Telephone Encounter (Signed)
Checked [facility]'s box up front and in the cabinet in B pod, no fax from [place].

## 2021-07-11 DIAGNOSIS — L821 Other seborrheic keratosis: Secondary | ICD-10-CM | POA: Diagnosis not present

## 2021-07-11 DIAGNOSIS — L570 Actinic keratosis: Secondary | ICD-10-CM | POA: Diagnosis not present

## 2021-07-13 NOTE — Telephone Encounter (Signed)
Form for CPAP supplies received, signed and faxed back to [place] with Huey Romans [phone].

## 2021-07-13 NOTE — Telephone Encounter (Signed)
Called, spoke with Megan Middleton advised that we did not receive the forms that were previously faxed and requested that form for supplies be sent to [phone].  She stated she would fax it to the # provided to my attention.

## 2021-07-13 NOTE — Telephone Encounter (Signed)
Received confirmation that fax was sent successfully.  Nothing further needed.

## 2021-07-19 ENCOUNTER — Telehealth: Payer: Self-pay | Admitting: Family

## 2021-07-19 NOTE — Telephone Encounter (Signed)
Pt called stating that she wanted to see if she could see Melissa regarding a swollen ankle. Pt was already scheduled with Ladona Ridgel at the time of answering call. Pt stated she would really rather see Melissa as she has handled all of her care as of late and did say to get seen if she experienced any swelling in her ankles. Pt is going on vacation after tomorrow and was wondering if there was any way Efraim Kaufmann would be willing to work her into her schedule tomorrow to be seen. Pt would like a call back with answer when available.

## 2021-07-20 ENCOUNTER — Ambulatory Visit (INDEPENDENT_AMBULATORY_CARE_PROVIDER_SITE_OTHER): Payer: Medicare Other | Admitting: Family

## 2021-07-20 ENCOUNTER — Ambulatory Visit: Payer: Medicare Other | Admitting: Family Medicine

## 2021-07-20 VITALS — BP 130/69 | HR 64 | Temp 98.4°F | Resp 16 | Wt 146.0 lb

## 2021-07-20 DIAGNOSIS — I10 Essential (primary) hypertension: Secondary | ICD-10-CM

## 2021-07-20 DIAGNOSIS — L570 Actinic keratosis: Secondary | ICD-10-CM | POA: Diagnosis not present

## 2021-07-20 DIAGNOSIS — L03116 Cellulitis of left lower limb: Secondary | ICD-10-CM | POA: Diagnosis not present

## 2021-07-20 MED ORDER — CEPHALEXIN 500 MG PO CAPS: 500.0000 mg | ORAL_CAPSULE | Freq: Three times a day (TID) | ORAL | 0 refills | Status: DC

## 2021-07-20 MED ORDER — LOSARTAN POTASSIUM 50 MG PO TABS: 50.0000 mg | ORAL_TABLET | Freq: Every day | ORAL | 0 refills | Status: DC

## 2021-07-20 NOTE — Assessment & Plan Note (Addendum)
  BP Readings from Last 3 Encounters:  07/20/21 130/69  05/23/21 136/63  05/10/21 106/60   Patient is bothered by the LE edema. Recommended d/c amlodipine and begin losartan '50mg'$  once daily.

## 2021-07-20 NOTE — Patient Instructions (Signed)
Stop amlodipine, start losartan. Follow up 1-2 weeks after you make this switch.

## 2021-07-20 NOTE — Assessment & Plan Note (Signed)
New. Will rx with Keflex '500mg'$  TID x 7 days. Pt will call if her symptoms worsen or if they do not improve.

## 2021-07-20 NOTE — Progress Notes (Signed)
Subjective:   By signing my name below, I, Carylon Perches, attest that this documentation has been prepared under the direction and in the presence of Karie Chimera, NP 07/20/2021       Patient ID: Megan Middleton, female    DOB: 02/06/1955, 66 y.o.   MRN: [id number]  Chief Complaint  Patient presents with   Joint Swelling    Complains of left ankle swelling    HPI Patient is in today for an office visit  Ankle Swelling: She complains of bilateral ankle swelling. However, the left side of her ankle is worse than the one on her right. She states that she went to a dermatologist recently for a biopsy and noticed the swelling and redness afterwards. As of today's visit, her blood pressure is normal. She is currently taking 5 Mg of Amlodipine.  BP Readings from Last 3 Encounters:  07/20/21 130/69  05/23/21 136/63  05/10/21 106/60   Pulse Readings from Last 3 Encounters:  07/20/21 64  05/23/21 65  05/10/21 81    Health Maintenance Due  Topic Date Due   Hepatitis C Screening  Never done   Pneumonia Vaccine 72+ Years old (2 - PPSV23 if available, else PCV20) 07/09/2019   COVID-19 Vaccine (4 - Pfizer series) 01/22/2020   MAMMOGRAM  06/28/2021    Past Medical History:  Diagnosis Date   Allergy    seasonal   Gilberts syndrome    Gynecological examination    sees Dr. [name]    Hyperlipidemia    Hypertension    Osteopenia 10/02/2020   Squamous cell carcinoma    Sees Derm [name]   Tubular adenoma of colon 2015    Past Surgical History:  Procedure Laterality Date   collar bone surgery     jan,2020   COLONOSCOPY  10-08-13   per Dr. Fuller Plan, adenomatous polyps, repear in 5 yrs   TUBAL LIGATION      Family History  Problem Relation Age of Onset   Hypertension Father    Breast cancer Sister    Lymphoma Mother    Leukemia Mother    Colon cancer Neg Hx    Esophageal cancer Neg Hx    Rectal cancer Neg Hx    Stomach cancer Neg Hx    Colon  polyps Neg Hx     Social History   Socioeconomic History   Marital status: Widowed    Spouse name: Not on file   Number of children: Not on file   Years of education: Not on file   Highest education level: Not on file  Occupational History   Not on file  Tobacco Use   Smoking status: Never   Smokeless tobacco: Never  Vaping Use   Vaping Use: Not on file  Substance and Sexual Activity   Alcohol use: Yes    Alcohol/week: 0.0 standard drinks of alcohol    Comment: occ / social   Drug use: No   Sexual activity: Not Currently  Other Topics Concern   Not on file  Social History Narrative   Originally from [place]   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[name]   Completed 2 year community college   Has one daughter- [name]   2 grandchildren   Enjoys volunteering, GYM, crafts, reading   Social Determinants of Health   Financial Resource Strain: Low Risk  (08/19/2020)   Overall Financial Resource Strain (CARDIA)    Difficulty of Paying Living Expenses: Not hard at  all  Food Insecurity: No Food Insecurity (08/19/2020)   Hunger Vital Sign    Worried About Running Out of Food in the Last Year: Never true    Ran Out of Food in the Last Year: Never true  Transportation Needs: No Transportation Needs (08/19/2020)   PRAPARE - Hydrologist (Medical): No    Lack of Transportation (Non-Medical): No  Physical Activity: Sufficiently Active (08/19/2020)   Exercise Vital Sign    Days of Exercise per Week: 7 days    Minutes of Exercise per Session: 40 min  Stress: No Stress Concern Present (08/19/2020)   [place]    Feeling of Stress : Not at all  Social Connections: Moderately Integrated (08/19/2020)   Social Connection and Isolation Panel [NHANES]    Frequency of Communication with Friends and Family: More than three times a week    Frequency of Social Gatherings with Friends  and Family: More than three times a week    Attends Religious Services: More than 4 times per year    Active Member of [name] or Organizations: Yes    Attends Archivist Meetings: 1 to 4 times per year    Marital Status: Widowed  Intimate Partner Violence: Not At Risk (08/19/2020)   Humiliation, Afraid, Rape, and Kick questionnaire    Fear of Current or Ex-Partner: No    Emotionally Abused: No    Physically Abused: No    Sexually Abused: No    Outpatient Medications Prior to Visit  Medication Sig Dispense Refill   amLODipine (NORVASC) 5 MG tablet TAKE 1 TABLET (5 MG TOTAL) BY MOUTH DAILY. 90 tablet 0   calcium carbonate (OS-CAL) 600 MG TABS Take 600 mg by mouth daily. With vitamin d     escitalopram (LEXAPRO) 20 MG tablet TAKE 1 TABLET BY MOUTH EVERY DAY 90 tablet 1   famotidine (PEPCID) 20 MG tablet Take 20 mg by mouth at bedtime.     NON FORMULARY JUICE PLUS     omeprazole (PRILOSEC) 20 MG capsule TAKE 1 CAPSULE BY MOUTH EVERY DAY 90 capsule 1   No facility-administered medications prior to visit.    Allergies  Allergen Reactions   Doxycycline     GI side effects   Morphine Other (See Comments) and Nausea Only    Low blood pressure    Review of Systems  Musculoskeletal:        (+) Bilateral Ankle Swelling       Objective:    Physical Exam Constitutional:      General: She is not in acute distress.    Appearance: Normal appearance. She is not ill-appearing.  HENT:     Head: Normocephalic and atraumatic.     Right Ear: External ear normal.     Left Ear: External ear normal.  Eyes:     Extraocular Movements: Extraocular movements intact.     Pupils: Pupils are equal, round, and reactive to light.  Musculoskeletal:     Right lower leg: 1+ Edema present.     Left lower leg: 2+ Edema present.     Left ankle: No tenderness.  Skin:    General: Skin is warm and dry.     Findings: Erythema present.     Comments: Erythema base of left shin warm to touch and  two scabs noted   Neurological:     Mental Status: She is alert and oriented to person, place, and  time.  Psychiatric:        Mood and Affect: Mood normal.        Behavior: Behavior normal.        Judgment: Judgment normal.   BP 130/69 (BP Location: Right Arm, Patient Position: Sitting, Cuff Size: Small)   Pulse 64   Temp 98.4 F (36.9 C) (Oral)   Resp 16   Wt 146 lb (66.2 kg)   SpO2 99%   BMI 22.87 kg/m  Wt Readings from Last 3 Encounters:  07/20/21 146 lb (66.2 kg)  05/23/21 143 lb (64.9 kg)  05/10/21 144 lb 6.4 oz (65.5 kg)        Assessment & Plan:   Problem List Items Addressed This Visit       Unprioritized   HTN (hypertension)     BP Readings from Last 3 Encounters:  07/20/21 130/69  05/23/21 136/63  05/10/21 106/60   Patient is bothered by the LE edema. Recommended d/c amlodipine and begin losartan '50mg'$  once daily.       Relevant Medications   losartan (COZAAR) 50 MG tablet   Cellulitis of left lower extremity - Primary    New. Will rx with Keflex '500mg'$  TID x 7 days. Pt will call if her symptoms worsen or if they do not improve.       Meds ordered this encounter  Medications   losartan (COZAAR) 50 MG tablet    Sig: Take 1 tablet (50 mg total) by mouth daily.    Dispense:  30 tablet    Refill:  0    Order Specific Question:   Supervising Provider    Answer:   Penni Homans A [[id number]]   cephALEXin (KEFLEX) 500 MG capsule    Sig: Take 1 capsule (500 mg total) by mouth 3 (three) times daily.    Dispense:  21 capsule    Refill:  0    Order Specific Question:   Supervising Provider    Answer:   Penni Homans A [[id number]]    I, Nance Pear, NP, personally preformed the services described in this documentation.  All medical record entries made by the scribe were at my direction and in my presence.  I have reviewed the chart and discharge instructions (if applicable) and agree that the record reflects my personal performance and is accurate and  complete. 07/20/2021   I,Amber Collins,acting as a scribe for Nance Pear, NP.,have documented all relevant documentation on the behalf of Nance Pear, NP,as directed by  Nance Pear, NP while in the presence of Nance Pear, NP.   Nance Pear, NP

## 2021-07-21 NOTE — Telephone Encounter (Signed)
Called and spoke with pt to see if she still wanted to receive a travel CPAP machine and pt stated that she did receive one as she bought it directly from the company. Nothing further needed.

## 2021-07-27 ENCOUNTER — Encounter: Payer: Self-pay | Admitting: Family

## 2021-07-29 ENCOUNTER — Ambulatory Visit
Admission: RE | Admit: 2021-07-29 | Discharge: 2021-07-29 | Disposition: A | Discharge status: Home / Self Care | Payer: Medicare Other | Source: Ambulatory Visit | Attending: Obstetrics and Gynecology | Admitting: Obstetrics and Gynecology

## 2021-07-29 DIAGNOSIS — Z1231 Encounter for screening mammogram for malignant neoplasm of breast: Secondary | ICD-10-CM | POA: Diagnosis not present

## 2021-08-02 DIAGNOSIS — L570 Actinic keratosis: Secondary | ICD-10-CM | POA: Diagnosis not present

## 2021-08-09 DIAGNOSIS — L570 Actinic keratosis: Secondary | ICD-10-CM | POA: Diagnosis not present

## 2021-08-10 ENCOUNTER — Ambulatory Visit (INDEPENDENT_AMBULATORY_CARE_PROVIDER_SITE_OTHER): Payer: Medicare Other | Admitting: Adult Health

## 2021-08-10 ENCOUNTER — Encounter: Payer: Self-pay | Admitting: Adult Health

## 2021-08-10 DIAGNOSIS — G4733 Obstructive sleep apnea (adult) (pediatric): Secondary | ICD-10-CM

## 2021-08-10 NOTE — Progress Notes (Signed)
$'@Patient'S$  ID: Megan Middleton, female    DOB: Mar 07, 1955, 66 y.o.   MRN: [id number]  Chief Complaint  Patient presents with   Follow-up    Referring provider: [name], NP  HPI: 66 year old female seen for sleep consult February 23, 2021 found to have severe sleep apnea  TEST/EVENTS :  HST 04/21/21 >> AHI 39.9, SpO2 low 79%  08/10/2021 Follow up: OSA  Patient presents for a 75-monthfollow-up.  Patient was seen for sleep consult February 2023 for daytime sleepiness and snoring.  She was set for home sleep study completed April 21, 2021 that showed severe sleep apnea with AHI 39.9/hour and SPO2 low at 79%.  She was started on CPAP.  Patient says since starting CPAP she feels so much better.  She is able to sleep most of the night.  Feels rested the next day.  Patient says this made a huge difference in her quality of life.  She is currently using the DreamWear nasal mask.  Patient says she travels quite a bit.  And needed a travel machine.  Says she cannot sleep without it.  So she is now using her travel CPAP when she is away from home.  She purchased a CPAP mini. Patient says she has downloaded the ABethelapp to her phone for both of her machines.  She has excellent use with 100% usage.  Daily average usage at 8 hours.  She is on auto CPAP 5 to 15 cm.  AHI is 2.6.  Daily average pressure at 11.6 cm H2O. Does occasionally get some nasal dryness. Needs a medical letter for insurance for necessity for her travel CPAP mini .   [place]  [place] and [place].  Has 3 grandchildren. Husband passed away 6 years ago from a rare stomach cancer.  She remains very active.  Allergies  Allergen Reactions   Doxycycline     GI side effects   Morphine Other (See Comments) and Nausea Only    Low blood pressure    Immunization History  Administered Date(s) Administered   Fluad Quad(high Dose 65+) 10/09/2018, 10/07/2019, 10/31/2020   Influenza Split  11/09/2010, 11/10/2011, 11/05/2012   Influenza, High Dose Seasonal PF 10/30/2016   Influenza,inj,Quad PF,6+ Mos 10/16/2017   Influenza-Unspecified 11/06/2013, 11/26/2014, 10/27/2015   PFIZER(Purple Top)SARS-COV-2 Vaccination 02/12/2019, 03/05/2019, 11/27/2019   Pneumococcal Conjugate-13 07/09/2018   Rabies Immune Globulin 11/12/2019, 11/15/2019, 11/19/2019, 11/26/2019   Tdap 09/24/2010, 11/12/2019   Zoster Recombinat (Shingrix) 10/16/2017, 12/24/2017   Zoster, Live 04/27/2014    Past Medical History:  Diagnosis Date   Allergy    seasonal   Gilberts syndrome    Gynecological examination    sees Dr. KPaula Compton   Hyperlipidemia    Hypertension    Osteopenia 10/02/2020   Squamous cell carcinoma    Sees Derm ATheora Gianotti  Tubular adenoma of colon 2015    Tobacco History: Social History   Tobacco Use  Smoking Status Never  Smokeless Tobacco Never   Counseling given: Not Answered   Outpatient Medications Prior to Visit  Medication Sig Dispense Refill   amLODipine (NORVASC) 5 MG tablet TAKE 1 TABLET (5 MG TOTAL) BY MOUTH DAILY. 90 tablet 0   calcium carbonate (OS-CAL) 600 MG TABS Take 600 mg by mouth daily. With vitamin d     escitalopram (LEXAPRO) 20 MG tablet TAKE 1 TABLET BY MOUTH EVERY DAY 90 tablet 1   famotidine (PEPCID) 20 MG tablet Take 20  mg by mouth at bedtime.     NON FORMULARY JUICE PLUS     omeprazole (PRILOSEC) 20 MG capsule TAKE 1 CAPSULE BY MOUTH EVERY DAY (Patient taking differently: Take 20 mg by mouth daily. As needed) 90 capsule 1   cephALEXin (KEFLEX) 500 MG capsule Take 1 capsule (500 mg total) by mouth 3 (three) times daily. (Patient not taking: Reported on 08/10/2021) 21 capsule 0   No facility-administered medications prior to visit.     Review of Systems:   Constitutional:   No  weight loss, night sweats,  Fevers, chills, fatigue, or  lassitude.  HEENT:   No headaches,  Difficulty swallowing,  Tooth/dental problems, or  Sore throat,                 No sneezing, itching, ear ache, nasal congestion, post nasal drip,   CV:  No chest pain,  Orthopnea, PND, swelling in lower extremities, anasarca, dizziness, palpitations, syncope.   GI  No heartburn, indigestion, abdominal pain, nausea, vomiting, diarrhea, change in bowel habits, loss of appetite, bloody stools.   Resp: No shortness of breath with exertion or at rest.  No excess mucus, no productive cough,  No non-productive cough,  No coughing up of blood.  No change in color of mucus.  No wheezing.  No chest wall deformity  Skin: no rash or lesions.  GU: no dysuria, change in color of urine, no urgency or frequency.  No flank pain, no hematuria   MS:  No joint pain or swelling.  No decreased range of motion.  No back pain.    Physical Exam  BP 120/62 (BP Location: Left Arm, Cuff Size: Normal)   Pulse 70   Temp 98.1 F (36.7 C) (Temporal)   Ht 5' 7.5" (1.715 m)   Wt 145 lb 9.6 oz (66 kg)   SpO2 96%   BMI 22.47 kg/m   GEN: A/Ox3; pleasant , NAD, well nourished    HEENT:  [place]/AT,  NOSE-clear, THROAT-clear, no lesions, no postnasal drip or exudate noted.  Class II-III MP airway  NECK:  Supple w/ fair ROM; no JVD; normal carotid impulses w/o bruits; no thyromegaly or nodules palpated; no lymphadenopathy.    RESP  Clear  P & A; w/o, wheezes/ rales/ or rhonchi. no accessory muscle use, no dullness to percussion  CARD:  RRR, no m/r/g, no peripheral edema, pulses intact, no cyanosis or clubbing.  GI:   Soft & nt; nml bowel sounds; no organomegaly or masses detected.   Musco: Warm bil, no deformities or joint swelling noted.   Neuro: alert, no focal deficits noted.    Skin: Warm, no lesions or rashes    Lab Results:  CBC   ProBNP No results found for: "PROBNP"  Imaging:        No data to display          No results found for: "NITRICOXIDE"      Assessment & Plan:   OSA (obstructive sleep apnea) Severe sleep apnea with excellent control  compliance on CPAP.  Patient is to keep up the good work.  No changes.  Medical letter was completed today for travel mini CPAP   Plan  Patient Instructions  Continue on CPAP at bedtime.  Keep up good . Continue to have healthy weight Activity as tolerated Do not drive if sleepy Saline nasal spray Twice daily   Saline nasal gel At bedtime   Follow-up in 6 months with Dr. Halford Chessman or Avital Dancy NP  and As needed         Rexene Edison, NP 08/10/2021

## 2021-08-10 NOTE — Assessment & Plan Note (Signed)
Severe sleep apnea with excellent control compliance on CPAP.  Patient is to keep up the good work.  No changes.  Medical letter was completed today for travel mini CPAP   Plan  Patient Instructions  Continue on CPAP at bedtime.  Keep up good . Continue to have healthy weight Activity as tolerated Do not drive if sleepy Saline nasal spray Twice daily   Saline nasal gel At bedtime   Follow-up in 6 months with Dr. Halford Chessman or Neeraj Housand NP and As needed

## 2021-08-10 NOTE — Progress Notes (Signed)
Reviewed and agree with assessment/plan.   Chesley Mires, MD [facility] Pulmonary/Critical Care 08/10/2021, 4:35 PM Pager:  [phone]

## 2021-08-10 NOTE — Patient Instructions (Signed)
Continue on CPAP at bedtime.  Keep up good . Continue to have healthy weight Activity as tolerated Do not drive if sleepy Saline nasal spray Twice daily   Saline nasal gel At bedtime   Follow-up in 6 months with Dr. Halford Chessman or Ankur Snowdon NP and As needed

## 2021-08-11 ENCOUNTER — Other Ambulatory Visit: Payer: Self-pay | Admitting: Family

## 2021-08-15 ENCOUNTER — Ambulatory Visit: Payer: Medicare Other | Admitting: Family

## 2021-08-25 ENCOUNTER — Ambulatory Visit: Payer: Medicare Other

## 2021-08-26 ENCOUNTER — Other Ambulatory Visit: Payer: Self-pay | Admitting: Family

## 2021-08-30 ENCOUNTER — Ambulatory Visit (INDEPENDENT_AMBULATORY_CARE_PROVIDER_SITE_OTHER): Payer: Medicare Other

## 2021-08-30 VITALS — BP 126/73 | HR 75 | Temp 97.6°F | Resp 16 | Ht 67.0 in | Wt 148.4 lb

## 2021-08-30 DIAGNOSIS — Z0000 Encounter for general adult medical examination without abnormal findings: Secondary | ICD-10-CM | POA: Diagnosis not present

## 2021-08-30 NOTE — Patient Instructions (Signed)
Megan Middleton , Thank you for taking time to come for your Medicare Wellness Visit. I appreciate your ongoing commitment to your health goals. Please review the following plan we discussed and let me know if I can assist you in the future.   Screening recommendations/referrals: Colonoscopy: 07/03/18 due 07/02/25 Mammogram: 07/29/21 due 07/30/22 Bone Density: 09/30/20 due 10/01/22 Recommended yearly ophthalmology/optometry visit for glaucoma screening and checkup Recommended yearly dental visit for hygiene and checkup  Vaccinations: Influenza vaccine: up to date Pneumococcal vaccine: declined Tdap vaccine: up to date Shingles vaccine: Due-May obtain vaccine at  your local pharmacy.    Covid-19:declined  Advanced directives: yes, not on file   Conditions/risks identified: see problem list   Next appointment: Follow up in one year for your annual wellness visit 09/01/22   Preventive Care 65 Years and Older, Female Preventive care refers to lifestyle choices and visits with your health care provider that can promote health and wellness. What does preventive care include? A yearly physical exam. This is also called an annual well check. Dental exams once or twice a year. Routine eye exams. Ask your health care provider how often you should have your eyes checked. Personal lifestyle choices, including: Daily care of your teeth and gums. Regular physical activity. Eating a healthy diet. Avoiding tobacco and drug use. Limiting alcohol use. Practicing safe sex. Taking low-dose aspirin every day. Taking vitamin and mineral supplements as recommended by your health care provider. What happens during an annual well check? The services and screenings done by your health care provider during your annual well check will depend on your age, overall health, lifestyle risk factors, and family history of disease. Counseling  Your health care provider may ask you questions about your: Alcohol use. Tobacco  use. Drug use. Emotional well-being. Home and relationship well-being. Sexual activity. Eating habits. History of falls. Memory and ability to understand (cognition). Work and work Statistician. Reproductive health. Screening  You may have the following tests or measurements: Height, weight, and BMI. Blood pressure. Lipid and cholesterol levels. These may be checked every 5 years, or more frequently if you are over 84 years old. Skin check. Lung cancer screening. You may have this screening every year starting at age 79 if you have a 30-pack-year history of smoking and currently smoke or have quit within the past 15 years. Fecal occult blood test (FOBT) of the stool. You may have this test every year starting at age 61. Flexible sigmoidoscopy or colonoscopy. You may have a sigmoidoscopy every 5 years or a colonoscopy every 10 years starting at age 19. Hepatitis C blood test. Hepatitis B blood test. Sexually transmitted disease (STD) testing. Diabetes screening. This is done by checking your blood sugar (glucose) after you have not eaten for a while (fasting). You may have this done every 1-3 years. Bone density scan. This is done to screen for osteoporosis. You may have this done starting at age 43. Mammogram. This may be done every 1-2 years. Talk to your health care provider about how often you should have regular mammograms. Talk with your health care provider about your test results, treatment options, and if necessary, the need for more tests. Vaccines  Your health care provider may recommend certain vaccines, such as: Influenza vaccine. This is recommended every year. Tetanus, diphtheria, and acellular pertussis (Tdap, Td) vaccine. You may need a Td booster every 10 years. Zoster vaccine. You may need this after age 46. Pneumococcal 13-valent conjugate (PCV13) vaccine. One dose is recommended after  age 55. Pneumococcal polysaccharide (PPSV23) vaccine. One dose is recommended  after age 39. Talk to your health care provider about which screenings and vaccines you need and how often you need them. This information is not intended to replace advice given to you by your health care provider. Make sure you discuss any questions you have with your health care provider. Document Released: 02/05/2015 Document Revised: 09/29/2015 Document Reviewed: 11/10/2014 Elsevier Interactive Patient Education  2017 [place] Prevention in the Home Falls can cause injuries. They can happen to people of all ages. There are many things you can do to make your home safe and to help prevent falls. What can I do on the outside of my home? Regularly fix the edges of walkways and driveways and fix any cracks. Remove anything that might make you trip as you walk through a door, such as a raised step or threshold. Trim any bushes or trees on the path to your home. Use bright outdoor lighting. Clear any walking paths of anything that might make someone trip, such as rocks or tools. Regularly check to see if handrails are loose or broken. Make sure that both sides of any steps have handrails. Any raised decks and porches should have guardrails on the edges. Have any leaves, snow, or ice cleared regularly. Use sand or salt on walking paths during winter. Clean up any spills in your garage right away. This includes oil or grease spills. What can I do in the bathroom? Use night lights. Install grab bars by the toilet and in the tub and shower. Do not use towel bars as grab bars. Use non-skid mats or decals in the tub or shower. If you need to sit down in the shower, use a plastic, non-slip stool. Keep the floor dry. Clean up any water that spills on the floor as soon as it happens. Remove soap buildup in the tub or shower regularly. Attach bath mats securely with double-sided non-slip rug tape. Do not have throw rugs and other things on the floor that can make you trip. What can I do  in the bedroom? Use night lights. Make sure that you have a light by your bed that is easy to reach. Do not use any sheets or blankets that are too big for your bed. They should not hang down onto the floor. Have a firm chair that has side arms. You can use this for support while you get dressed. Do not have throw rugs and other things on the floor that can make you trip. What can I do in the kitchen? Clean up any spills right away. Avoid walking on wet floors. Keep items that you use a lot in easy-to-reach places. If you need to reach something above you, use a strong step stool that has a grab bar. Keep electrical cords out of the way. Do not use floor polish or wax that makes floors slippery. If you must use wax, use non-skid floor wax. Do not have throw rugs and other things on the floor that can make you trip. What can I do with my stairs? Do not leave any items on the stairs. Make sure that there are handrails on both sides of the stairs and use them. Fix handrails that are broken or loose. Make sure that handrails are as long as the stairways. Check any carpeting to make sure that it is firmly attached to the stairs. Fix any carpet that is loose or worn. Avoid having throw rugs  at the top or bottom of the stairs. If you do have throw rugs, attach them to the floor with carpet tape. Make sure that you have a light switch at the top of the stairs and the bottom of the stairs. If you do not have them, ask someone to add them for you. What else can I do to help prevent falls? Wear shoes that: Do not have high heels. Have rubber bottoms. Are comfortable and fit you well. Are closed at the toe. Do not wear sandals. If you use a stepladder: Make sure that it is fully opened. Do not climb a closed stepladder. Make sure that both sides of the stepladder are locked into place. Ask someone to hold it for you, if possible. Clearly mark and make sure that you can see: Any grab bars or  handrails. First and last steps. Where the edge of each step is. Use tools that help you move around (mobility aids) if they are needed. These include: Canes. Walkers. Scooters. Crutches. Turn on the lights when you go into a dark area. Replace any light bulbs as soon as they burn out. Set up your furniture so you have a clear path. Avoid moving your furniture around. If any of your floors are uneven, fix them. If there are any pets around you, be aware of where they are. Review your medicines with your doctor. Some medicines can make you feel dizzy. This can increase your chance of falling. Ask your doctor what other things that you can do to help prevent falls. This information is not intended to replace advice given to you by your health care provider. Make sure you discuss any questions you have with your health care provider. Document Released: 11/05/2008 Document Revised: 06/17/2015 Document Reviewed: 02/13/2014 Elsevier Interactive Patient Education  2017 [employer].

## 2021-08-30 NOTE — Progress Notes (Signed)
Subjective:   Megan Middleton is a 66 y.o. female who presents for Medicare Annual (Subsequent) preventive examination.  Review of Systems     Cardiac Risk Factors include: advanced age (>25mn, >>30women);hypertension;dyslipidemia     Objective:    Today's Vitals   08/30/21 1546  BP: 126/73  Pulse: 75  Resp: 16  Temp: 97.6 F (36.4 C)  SpO2: 97%  Weight: 148 lb 6.4 oz (67.3 kg)  Height: '5\' 7"'$  (1.702 m)   Body mass index is 23.24 kg/m.     08/30/2021    3:43 PM 08/19/2020    8:15 AM 08/13/2019    8:11 AM 03/08/2014   12:09 PM 10/08/2013    7:48 AM 09/24/2013    8:03 AM  Advanced Directives  Does Patient Have a Medical Advance Directive? Yes Yes Yes No Yes Yes  Type of AParamedicof AParsonsLiving will [place]mers[place]Living will [place]Living will  Healthcare Power of [place] will  Does patient want to make changes to medical advance directive? No - Patient declined  No - Patient declined     Copy of HRaymondin Chart? No - copy requested No - copy requested No - copy requested     Would patient like information on creating a medical advance directive?    No - patient declined information      Current Medications (verified) Outpatient Encounter Medications as of 08/30/2021  Medication Sig   amLODipine (NORVASC) 5 MG tablet TAKE 1 TABLET (5 MG TOTAL) BY MOUTH DAILY.   calcium carbonate (OS-CAL) 600 MG TABS Take 600 mg by mouth daily. With vitamin d   escitalopram (LEXAPRO) 20 MG tablet TAKE 1 TABLET BY MOUTH EVERY DAY   NON FORMULARY JUICE PLUS   omeprazole (PRILOSEC) 20 MG capsule TAKE 1 CAPSULE BY MOUTH EVERY DAY (Patient taking differently: Take 20 mg by mouth daily. As needed)   famotidine (PEPCID) 20 MG tablet Take 20 mg by mouth at bedtime. (Patient not taking: Reported on 08/30/2021)   [DISCONTINUED] cephALEXin (KEFLEX) 500 MG capsule Take 1 capsule  (500 mg total) by mouth 3 (three) times daily. (Patient not taking: Reported on 08/10/2021)   No facility-administered encounter medications on file as of 08/30/2021.    Allergies (verified) Doxycycline and Morphine   History: Past Medical History:  Diagnosis Date   Allergy    seasonal   Gilberts syndrome    Gynecological examination    sees Dr. KPaula Compton   Hyperlipidemia    Hypertension    Osteopenia 10/02/2020   Squamous cell carcinoma    Sees Derm ATheora Gianotti  Tubular adenoma of colon 2015   Past Surgical History:  Procedure Laterality Date   collar bone surgery     jan,2020   COLONOSCOPY  10-08-13   per Dr. SFuller Plan adenomatous polyps, repear in 5 yrs   TUBAL LIGATION     Family History  Problem Relation Age of Onset   Hypertension Father    Breast cancer Sister    Lymphoma Mother    Leukemia Mother    Colon cancer Neg Hx    Esophageal cancer Neg Hx    Rectal cancer Neg Hx    Stomach cancer Neg Hx    Colon polyps Neg Hx    Social History   Socioeconomic History   Marital status: Widowed    Spouse name: Not on file   Number of  children: Not on file   Years of education: Not on file   Highest education level: Not on file  Occupational History   Not on file  Tobacco Use   Smoking status: Never   Smokeless tobacco: Never  Vaping Use   Vaping Use: Not on file  Substance and Sexual Activity   Alcohol use: Yes    Alcohol/week: 0.0 standard drinks of alcohol    Comment: occ / social   Drug use: No   Sexual activity: Not Currently  Other Topics Concern   Not on file  Social History Narrative   Originally from [place]   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[name]   Completed 2 year community college   Has one daughter- [name]   2 grandchildren   Enjoys volunteering, GYM, crafts, reading   Social Determinants of Health   Financial Resource Strain: Low Risk  (08/19/2020)   Overall Financial Resource Strain (CARDIA)    Difficulty  of Paying Living Expenses: Not hard at all  Food Insecurity: No Food Insecurity (08/19/2020)   Hunger Vital Sign    Worried About Running Out of Food in the Last Year: Never true    [place] in the Last Year: Never true  Transportation Needs: No Transportation Needs (08/19/2020)   PRAPARE - Hydrologist (Medical): No    Lack of Transportation (Non-Medical): No  Physical Activity: Sufficiently Active (08/19/2020)   Exercise Vital Sign    Days of Exercise per Week: 7 days    Minutes of Exercise per Session: 40 min  Stress: No Stress Concern Present (08/19/2020)   [place]    Feeling of Stress : Not at all  Social Connections: Moderately Integrated (08/19/2020)   Social Connection and Isolation Panel [NHANES]    Frequency of Communication with Friends and Family: More than three times a week    Frequency of Social Gatherings with Friends and Family: More than three times a week    Attends Religious Services: More than 4 times per year    Active Member of [name] or Organizations: Yes    Attends Archivist Meetings: 1 to 4 times per year    Marital Status: Widowed    Tobacco Counseling Counseling given: Not Answered   Clinical Intake:  Pre-visit preparation completed: Yes  Pain : No/denies pain     BMI - recorded: 23.24 Nutritional Status: BMI of 19-24  Normal Nutritional Risks: None Diabetes: No  How often do you need to have someone help you when you read instructions, pamphlets, or other written materials from your doctor or pharmacy?: 1 - Never  Diabetic?no  Interpreter Needed?: No  Information entered by :: [place] of Daily Living    08/30/2021    3:47 PM  In your present state of health, do you have any difficulty performing the following activities:  Hearing? 0  Vision? 0  Difficulty concentrating or making decisions? 0  Walking or  climbing stairs? 0  Dressing or bathing? 0  Preparing Food and eating ? N  Using the Toilet? N  In the past six months, have you accidently leaked urine? N  Do you have problems with loss of bowel control? N  Managing your Medications? N  Managing your Finances? N  Housekeeping or managing your Housekeeping? N    Patient Care Team: [name], NP as PCP - General (Internal Medicine) Marvel Plan,  Juliann Pulse, MD as Consulting Physician (Obstetrics and Gynecology) Earlie Server, MD as Consulting Physician (Orthopedic Surgery)  Indicate any recent Medical Services you may have received from other than [facility] providers in the past year (date may be approximate).     Assessment:   This is a routine wellness examination for Megan Middleton.  Hearing/Vision screen No results found.  Dietary issues and exercise activities discussed: Current Exercise Habits: Home exercise routine, Type of exercise: walking;stretching;strength training/weights, Time (Minutes): 60, Frequency (Times/Week): 5, Weekly Exercise (Minutes/Week): 300, Intensity: Mild, Exercise limited by: None identified   Goals Addressed   None    Depression Screen    08/30/2021    3:44 PM 08/19/2020    8:17 AM 08/13/2019    8:14 AM 07/25/2019    9:35 AM 04/23/2017   10:08 AM 04/03/2016    9:31 AM 01/03/2016    8:11 AM  PHQ 2/9 Scores  PHQ - 2 Score 0 0 0 0 0 0 0  PHQ- 9 Score    4 1      Fall Risk    08/30/2021    3:43 PM 08/19/2020    8:17 AM 08/13/2019    8:14 AM 07/25/2019    8:30 AM 01/03/2016    8:11 AM  Fall Risk   Falls in the past year? 0 0 0 0 No  Number falls in past yr: 0 0 0 0   Injury with Fall? 0 0 0 0   Risk for fall due to : No Fall Risks      Follow up Falls evaluation completed Falls prevention discussed Education provided;Falls prevention discussed      FALL RISK PREVENTION PERTAINING TO THE HOME:  Any stairs in or around the home? No  If so, are there any without handrails?  N/a Home free of loose  throw rugs in walkways, pet beds, electrical cords, etc? Yes  Adequate lighting in your home to reduce risk of falls? Yes   ASSISTIVE DEVICES UTILIZED TO PREVENT FALLS:  Life alert? No  Use of a cane, walker or w/c? No  Grab bars in the bathroom? No  Shower chair or bench in shower? No  Elevated toilet seat or a handicapped toilet? No   TIMED UP AND GO:  Was the test performed? Yes .  Length of time to ambulate 10 feet: 9 sec.   Gait steady and fast without use of assistive device  Cognitive Function:        08/30/2021    3:50 PM  6CIT Screen  What Year? 0 points  What month? 0 points  What time? 0 points  Count back from 20 0 points  Months in reverse 0 points  Repeat phrase 2 points  Total Score 2 points    Immunizations Immunization History  Administered Date(s) Administered   Fluad Quad(high Dose 65+) 10/09/2018, 10/07/2019, 10/31/2020   Influenza Split 11/09/2010, 11/10/2011, 11/05/2012   Influenza, High Dose Seasonal PF 10/30/2016   Influenza,inj,Quad PF,6+ Mos 10/16/2017   Influenza-Unspecified 11/06/2013, 11/26/2014, 10/27/2015   PFIZER(Purple Top)SARS-COV-2 Vaccination 02/12/2019, 03/05/2019, 11/27/2019   Pneumococcal Conjugate-13 07/09/2018   Rabies Immune Globulin 11/12/2019, 11/15/2019, 11/19/2019, 11/26/2019   Tdap 09/24/2010, 11/12/2019   Zoster Recombinat (Shingrix) 10/16/2017, 12/24/2017   Zoster, Live 04/27/2014    TDAP status: Up to date  Flu Vaccine status: Up to date  Pneumococcal vaccine status: Due, Education has been provided regarding the importance of this vaccine. Advised may receive this vaccine at local pharmacy or Health Dept. Aware to  provide a copy of the vaccination record if obtained from local pharmacy or Health Dept. Verbalized acceptance and understanding.  Covid-19 vaccine status: Declined, Education has been provided regarding the importance of this vaccine but patient still declined. Advised may receive this vaccine at local  pharmacy or Health Dept.or vaccine clinic. Aware to provide a copy of the vaccination record if obtained from local pharmacy or Health Dept. Verbalized acceptance and understanding.  Qualifies for Shingles Vaccine? Yes   Zostavax completed No   Shingrix Completed?: No.    Education has been provided regarding the importance of this vaccine. Patient has been advised to call insurance company to determine out of pocket expense if they have not yet received this vaccine. Advised may also receive vaccine at local pharmacy or Health Dept. Verbalized acceptance and understanding.  Screening Tests Health Maintenance  Topic Date Due   Hepatitis C Screening  Never done   Pneumonia Vaccine 29+ Years old (2 - PPSV23 or PCV20) 07/09/2019   COVID-19 Vaccine (4 - Pfizer series) 01/22/2020   INFLUENZA VACCINE  08/23/2021   MAMMOGRAM  07/30/2022   COLONOSCOPY (Pts 45-45yr Insurance coverage will need to be confirmed)  07/02/2025   TETANUS/TDAP  11/11/2029   DEXA SCAN  Completed   Zoster Vaccines- Shingrix  Completed   HPV VACCINES  Aged Out    Health Maintenance  Health Maintenance Due  Topic Date Due   Hepatitis C Screening  Never done   Pneumonia Vaccine 68 Years old (2 - PPSV23 or PCV20) 07/09/2019   COVID-19 Vaccine (4 - Pfizer series) 01/22/2020   INFLUENZA VACCINE  08/23/2021    Colorectal cancer screening: Type of screening: Colonoscopy. Completed 07/03/18. Repeat every 7 years  Mammogram status: Completed 07/29/21. Repeat every year  Bone Density status: Completed 09/30/20. Results reflect: Bone density results: OSTEOPENIA. Repeat every 2 years.  Lung Cancer Screening: (Low Dose CT Chest recommended if Age 68-80years, 30 pack-year currently smoking OR have quit w/in 15years.) does not qualify.   Lung Cancer Screening Referral: n/a  Additional Screening:  Hepatitis C Screening: does qualify; Completed not yet   Vision Screening: Recommended annual ophthalmology exams for early  detection of glaucoma and other disorders of the eye. Is the patient up to date with their annual eye exam?  Yes  Who is the provider or what is the name of the office in which the patient attends annual eye exams? Dr. AOrvan SeenIf pt is not established with a provider, would they like to be referred to a provider to establish care? No .   Dental Screening: Recommended annual dental exams for proper oral hygiene  Community Resource Referral / Chronic Care Management: CRR required this visit?  No   CCM required this visit?  No      Plan:     I have personally reviewed and noted the following in the patient's chart:   Medical and social history Use of alcohol, tobacco or illicit drugs  Current medications and supplements including opioid prescriptions.  Functional ability and status Nutritional status Physical activity Advanced directives List of other physicians Hospitalizations, surgeries, and ER visits in previous 12 months Vitals Screenings to include cognitive, depression, and falls Referrals and appointments  In addition, I have reviewed and discussed with patient certain preventive protocols, quality metrics, and best practice recommendations. A written personalized care plan for preventive services as well as general preventive health recommendations were provided to patient.     SDuard BradyChism, CMA   08/30/2021  Nurse Notes: none

## 2021-08-31 DIAGNOSIS — L814 Other melanin hyperpigmentation: Secondary | ICD-10-CM | POA: Diagnosis not present

## 2021-08-31 DIAGNOSIS — L570 Actinic keratosis: Secondary | ICD-10-CM | POA: Diagnosis not present

## 2021-08-31 DIAGNOSIS — C4441 Basal cell carcinoma of skin of scalp and neck: Secondary | ICD-10-CM | POA: Diagnosis not present

## 2021-08-31 DIAGNOSIS — L918 Other hypertrophic disorders of the skin: Secondary | ICD-10-CM | POA: Diagnosis not present

## 2021-08-31 DIAGNOSIS — X32XXXS Exposure to sunlight, sequela: Secondary | ICD-10-CM | POA: Diagnosis not present

## 2021-08-31 DIAGNOSIS — D485 Neoplasm of uncertain behavior of skin: Secondary | ICD-10-CM | POA: Diagnosis not present

## 2021-08-31 DIAGNOSIS — D2239 Melanocytic nevi of other parts of face: Secondary | ICD-10-CM | POA: Diagnosis not present

## 2021-09-10 ENCOUNTER — Other Ambulatory Visit: Payer: Self-pay | Admitting: Family

## 2021-09-13 DIAGNOSIS — L570 Actinic keratosis: Secondary | ICD-10-CM | POA: Diagnosis not present

## 2021-10-05 DIAGNOSIS — Z23 Encounter for immunization: Secondary | ICD-10-CM | POA: Diagnosis not present

## 2021-10-06 DIAGNOSIS — C4441 Basal cell carcinoma of skin of scalp and neck: Secondary | ICD-10-CM | POA: Diagnosis not present

## 2021-10-12 DIAGNOSIS — L905 Scar conditions and fibrosis of skin: Secondary | ICD-10-CM | POA: Diagnosis not present

## 2021-11-01 DIAGNOSIS — L570 Actinic keratosis: Secondary | ICD-10-CM | POA: Diagnosis not present

## 2021-11-02 DIAGNOSIS — L570 Actinic keratosis: Secondary | ICD-10-CM | POA: Diagnosis not present

## 2021-11-10 DIAGNOSIS — H2513 Age-related nuclear cataract, bilateral: Secondary | ICD-10-CM | POA: Diagnosis not present

## 2021-11-10 DIAGNOSIS — H43393 Other vitreous opacities, bilateral: Secondary | ICD-10-CM | POA: Diagnosis not present

## 2021-11-10 DIAGNOSIS — H524 Presbyopia: Secondary | ICD-10-CM | POA: Diagnosis not present

## 2021-11-10 DIAGNOSIS — H5203 Hypermetropia, bilateral: Secondary | ICD-10-CM | POA: Diagnosis not present

## 2021-11-21 ENCOUNTER — Other Ambulatory Visit: Payer: Self-pay | Admitting: Family

## 2021-11-23 ENCOUNTER — Ambulatory Visit (INDEPENDENT_AMBULATORY_CARE_PROVIDER_SITE_OTHER): Payer: Medicare Other | Admitting: Family

## 2021-11-23 ENCOUNTER — Encounter: Payer: Self-pay | Admitting: Family

## 2021-11-23 VITALS — BP 138/76 | HR 79 | Temp 98.2°F | Resp 16 | Wt 151.0 lb

## 2021-11-23 DIAGNOSIS — E785 Hyperlipidemia, unspecified: Secondary | ICD-10-CM | POA: Diagnosis not present

## 2021-11-23 DIAGNOSIS — H9190 Unspecified hearing loss, unspecified ear: Secondary | ICD-10-CM | POA: Diagnosis not present

## 2021-11-23 DIAGNOSIS — I10 Essential (primary) hypertension: Secondary | ICD-10-CM | POA: Diagnosis not present

## 2021-11-23 DIAGNOSIS — F419 Anxiety disorder, unspecified: Secondary | ICD-10-CM | POA: Diagnosis not present

## 2021-11-23 LAB — CBC WITH DIFFERENTIAL/PLATELET
Basophils Absolute: 0.1 10*3/uL (ref 0.0–0.1)
Basophils Relative: 1.4 % (ref 0.0–3.0)
Eosinophils Absolute: 0.2 10*3/uL (ref 0.0–0.7)
Eosinophils Relative: 2.8 % (ref 0.0–5.0)
HCT: 41.5 % (ref 36.0–46.0)
Hemoglobin: 14.2 g/dL (ref 12.0–15.0)
Lymphocytes Relative: 24 % (ref 12.0–46.0)
Lymphs Abs: 1.5 10*3/uL (ref 0.7–4.0)
MCHC: 34.3 g/dL (ref 30.0–36.0)
MCV: 92.5 fl (ref 78.0–100.0)
Monocytes Absolute: 0.3 10*3/uL (ref 0.1–1.0)
Monocytes Relative: 5.6 % (ref 3.0–12.0)
Neutro Abs: 4.1 10*3/uL (ref 1.4–7.7)
Neutrophils Relative %: 66.2 % (ref 43.0–77.0)
Platelets: 261 10*3/uL (ref 150.0–400.0)
RBC: 4.49 Mil/uL (ref 3.87–5.11)
RDW: 13.1 % (ref 11.5–15.5)
WBC: 6.2 10*3/uL (ref 4.0–10.5)

## 2021-11-23 LAB — LIPID PANEL
Cholesterol: 228 mg/dL — ABNORMAL HIGH (ref 0–200)
HDL: 75.2 mg/dL (ref >39.00)
LDL Cholesterol: 133 mg/dL — ABNORMAL HIGH (ref 0–99)
NonHDL: 152.94
Total CHOL/HDL Ratio: 3
Triglycerides: 98 mg/dL (ref 0.0–149.0)
VLDL: 19.6 mg/dL (ref 0.0–40.0)

## 2021-11-23 LAB — BASIC METABOLIC PANEL
BUN: 18 mg/dL (ref 6–23)
CO2: 31 mEq/L (ref 19–32)
Calcium: 9.3 mg/dL (ref 8.4–10.5)
Chloride: 100 mEq/L (ref 96–112)
Creatinine, Ser: 0.76 mg/dL (ref 0.40–1.20)
GFR: 80.44 mL/min (ref >60.00)
Glucose, Bld: 94 mg/dL (ref 70–99)
Potassium: 3.6 mEq/L (ref 3.5–5.1)
Sodium: 140 mEq/L (ref 135–145)

## 2021-11-23 LAB — TSH: TSH: 1.39 u[IU]/mL (ref 0.35–5.50)

## 2021-11-23 NOTE — Assessment & Plan Note (Signed)
Not on statin. Continues work on diet/exercise.

## 2021-11-23 NOTE — Addendum Note (Signed)
Addended by: [name] on: 11/23/2021 10:41 AM   Modules accepted: Orders

## 2021-11-23 NOTE — Assessment & Plan Note (Signed)
bp stable on amlodipine '5mg'$ . Continue same.

## 2021-11-23 NOTE — Progress Notes (Signed)
Subjective:     Patient ID: Megan Middleton, female    DOB: [birth year], 66 y.o.   MRN: [id number]  Chief Complaint  Patient presents with   Hypertension    Here for follow up   Constipation    Complains of constipation    Hypertension  Constipation   Patient is in today for follow up.   HTN- maintained on amlodipine '5mg'$ .  BP Readings from Last 3 Encounters:  11/23/21 138/76  08/30/21 126/73  08/10/21 120/62   GERD- maintained on omperazole '20mg'$  once daily prn. Notes intermittent symptoms.  She continues Pepcid QHS.     Constipation- She takes miralax in the AM in her coffee.  At night she takes metamucil HS.  She takes 1/2 cap of miralax.  She reports that she has been diagnosed with IBS in the past.  Tries to drink a lot of water.    Hearing loss- feels like she does not hear as well.   Anxiety- continues lexapro '20mg'$ .   Health Maintenance Due  Topic Date Due   Hepatitis C Screening  Never done   Pneumonia Vaccine 70+ Years old (2 - PPSV23 or PCV20) 07/09/2019   COVID-19 Vaccine (4 - Pfizer series) 01/22/2020    Past Medical History:  Diagnosis Date   Allergy    seasonal   Basal cell carcinoma 2023   left temple, sees dermatolog   Gilberts syndrome    Gynecological examination    sees Dr. [name]    Hyperlipidemia    Hypertension    Osteopenia 10/02/2020   Squamous cell carcinoma    Sees Derm [name]   Tubular adenoma of colon 2015    Past Surgical History:  Procedure Laterality Date   collar bone surgery     jan,2020   COLONOSCOPY  10-08-13   per Dr. Fuller Plan, adenomatous polyps, repear in 5 yrs   TUBAL LIGATION      Family History  Problem Relation Age of Onset   Hypertension Father    Breast cancer Sister    Lymphoma Mother    Leukemia Mother    Colon cancer Neg Hx    Esophageal cancer Neg Hx    Rectal cancer Neg Hx    Stomach cancer Neg Hx    Colon polyps Neg Hx     Social History   Socioeconomic History    Marital status: Widowed    Spouse name: Not on file   Number of children: Not on file   Years of education: Not on file   Highest education level: Not on file  Occupational History   Not on file  Tobacco Use   Smoking status: Never   Smokeless tobacco: Never  Vaping Use   Vaping Use: Not on file  Substance and Sexual Activity   Alcohol use: Yes    Alcohol/week: 0.0 standard drinks of alcohol    Comment: occ / social   Drug use: No   Sexual activity: Not Currently  Other Topics Concern   Not on file  Social History Narrative   Originally from [place]   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[name]   Completed 2 year community college   Has one daughter- [name]   2 grandchildren   Enjoys volunteering, GYM, crafts, reading   Social Determinants of Health   Financial Resource Strain: Low Risk  (08/19/2020)   Overall Financial Resource Strain (CARDIA)    Difficulty of Paying Living Expenses: Not hard at all  Food Insecurity: No Food Insecurity (08/19/2020)   Hunger Vital Sign    Worried About Running Out of Food in the Last Year: Never true    Ran Out of Food in the Last Year: Never true  Transportation Needs: No Transportation Needs (08/19/2020)   PRAPARE - Hydrologist (Medical): No    Lack of Transportation (Non-Medical): No  Physical Activity: Sufficiently Active (08/19/2020)   Exercise Vital Sign    Days of Exercise per Week: 7 days    Minutes of Exercise per Session: 40 min  Stress: No Stress Concern Present (08/19/2020)   [place]    Feeling of Stress : Not at all  Social Connections: Moderately Integrated (08/19/2020)   Social Connection and Isolation Panel [NHANES]    Frequency of Communication with Friends and Family: More than three times a week    Frequency of Social Gatherings with Friends and Family: More than three times a week    Attends Religious  Services: More than 4 times per year    Active Member of [name] or Organizations: Yes    Attends Archivist Meetings: 1 to 4 times per year    Marital Status: Widowed  Intimate Partner Violence: Not At Risk (08/19/2020)   Humiliation, Afraid, Rape, and Kick questionnaire    Fear of Current or Ex-Partner: No    Emotionally Abused: No    Physically Abused: No    Sexually Abused: No    Outpatient Medications Prior to Visit  Medication Sig Dispense Refill   amLODipine (NORVASC) 5 MG tablet TAKE 1 TABLET (5 MG TOTAL) BY MOUTH DAILY. 90 tablet 0   calcium carbonate (OS-CAL) 600 MG TABS Take 600 mg by mouth daily. With vitamin d     escitalopram (LEXAPRO) 20 MG tablet TAKE 1 TABLET BY MOUTH EVERY DAY 90 tablet 1   famotidine (PEPCID) 20 MG tablet Take 20 mg by mouth at bedtime.     NON FORMULARY JUICE PLUS     omeprazole (PRILOSEC) 20 MG capsule TAKE 1 CAPSULE BY MOUTH EVERY DAY (Patient taking differently: Take 20 mg by mouth daily. As needed) 90 capsule 1   No facility-administered medications prior to visit.    Allergies  Allergen Reactions   Doxycycline     GI side effects   Morphine Other (See Comments) and Nausea Only    Low blood pressure    Review of Systems  Gastrointestinal:  Positive for constipation.      See HPI Objective:    Physical Exam Constitutional:      General: She is not in acute distress.    Appearance: Normal appearance. She is well-developed.  HENT:     Head: Normocephalic and atraumatic.     Right Ear: External ear normal.     Left Ear: External ear normal.  Eyes:     General: No scleral icterus. Neck:     Thyroid: No thyromegaly.  Cardiovascular:     Rate and Rhythm: Normal rate and regular rhythm.     Heart sounds: Normal heart sounds. No murmur heard. Pulmonary:     Effort: Pulmonary effort is normal. No respiratory distress.     Breath sounds: Normal breath sounds. No wheezing.  Musculoskeletal:     Cervical back: Neck supple.   Skin:    General: Skin is warm and dry.  Neurological:     Mental Status: She is alert and oriented to person, place,  and time.  Psychiatric:        Mood and Affect: Mood normal.        Behavior: Behavior normal.        Thought Content: Thought content normal.        Judgment: Judgment normal.     BP 138/76 (BP Location: Right Arm, Patient Position: Sitting, Cuff Size: Small)   Pulse 79   Temp 98.2 F (36.8 C) (Oral)   Resp 16   Wt 151 lb (68.5 kg)   SpO2 96%   BMI 23.65 kg/m  Wt Readings from Last 3 Encounters:  11/23/21 151 lb (68.5 kg)  08/30/21 148 lb 6.4 oz (67.3 kg)  08/10/21 145 lb 9.6 oz (66 kg)       Assessment & Plan:   Problem List Items Addressed This Visit       Unprioritized   Hyperlipidemia - Primary    Not on statin. Continues work on diet/exercise.      Relevant Orders   Lipid panel   TSH   CBC with Differential/Platelet   HTN (hypertension)    bp stable on amlodipine '5mg'$ . Continue same.       Relevant Orders   Basic metabolic panel   TSH   CBC with Differential/Platelet   Anxiety    Stable on lexapro. Continue same.       Requesting labs as ordered- pt understands that insurance may not cover these tests.  I am having Prince Solian maintain her calcium carbonate, NON FORMULARY, famotidine, omeprazole, amLODipine, and escitalopram.  No orders of the defined types were placed in this encounter.

## 2021-11-23 NOTE — Assessment & Plan Note (Signed)
Stable on lexapro. Continue same.

## 2021-12-06 DIAGNOSIS — L570 Actinic keratosis: Secondary | ICD-10-CM | POA: Diagnosis not present

## 2021-12-07 ENCOUNTER — Encounter: Payer: Self-pay | Admitting: Family

## 2021-12-08 ENCOUNTER — Other Ambulatory Visit: Payer: Self-pay | Admitting: Family

## 2022-01-24 ENCOUNTER — Encounter: Payer: Self-pay | Admitting: Family

## 2022-02-01 ENCOUNTER — Ambulatory Visit (INDEPENDENT_AMBULATORY_CARE_PROVIDER_SITE_OTHER): Payer: Medicare Other | Admitting: Family

## 2022-02-01 VITALS — BP 131/67 | HR 63 | Temp 98.1°F | Resp 16 | Wt 153.0 lb

## 2022-02-01 DIAGNOSIS — J0100 Acute maxillary sinusitis, unspecified: Secondary | ICD-10-CM | POA: Diagnosis not present

## 2022-02-01 MED ORDER — AMOXICILLIN 500 MG PO CAPS: 500.0000 mg | ORAL_CAPSULE | Freq: Three times a day (TID) | ORAL | 0 refills | Status: DC

## 2022-02-01 NOTE — Progress Notes (Signed)
Subjective:   By signing my name below, I, Megan Middleton, attest that this documentation has been prepared under the direction and in the presence of Megan Alar, NP. 02/01/2022   Patient ID: Megan Middleton, female    DOB: Jul 18, 1955, 67 y.o.   MRN: [id number]  Chief Complaint  Patient presents with   Nasal Congestion    Complains of nasal, "head congestion" after covid.     HPI Patient is in today for a office visit.   Head congestion: She was diagnosed with Covid-19 10 days ago. She had a fever of 102. Since recovering from her symptoms, she continues having head congestion. She denies feeling pressure in her forehead and cheeks. She has seen an ENT specialist in the past for head congestion and was diagnosed with GERD. She clears out mucous that is mostly clear and occasionally bloody. She is regularly using a Neti pot to clear her sinus. Her symptoms worsened after she most recently recovered from Covid-19. She is using Flonase to manage her symptoms. She cannot take Augmentin due to it upsetting her stomach.    Past Medical History:  Diagnosis Date   Allergy    seasonal   Basal cell carcinoma 2023   left temple, sees dermatolog   Gilberts syndrome    Gynecological examination    sees Dr. Paula Middleton    Hyperlipidemia    Hypertension    Osteopenia 10/02/2020   Squamous cell carcinoma    Sees Derm Megan Middleton   Tubular adenoma of colon 2015    Past Surgical History:  Procedure Laterality Date   collar bone surgery     jan,2020   COLONOSCOPY  10-08-13   per Dr. Fuller Plan, adenomatous polyps, repear in 5 yrs   TUBAL LIGATION      Family History  Problem Relation Age of Onset   Hypertension Father    Breast cancer Sister    Lymphoma Mother    Leukemia Mother    Colon cancer Neg Hx    Esophageal cancer Neg Hx    Rectal cancer Neg Hx    Stomach cancer Neg Hx    Colon polyps Neg Hx     Social History   Socioeconomic History   Marital status:  Widowed    Spouse name: Not on file   Number of children: Not on file   Years of education: Not on file   Highest education level: Not on file  Occupational History   Not on file  Tobacco Use   Smoking status: Never   Smokeless tobacco: Never  Vaping Use   Vaping Use: Not on file  Substance and Sexual Activity   Alcohol use: Yes    Alcohol/week: 0.0 standard drinks of alcohol    Comment: occ / social   Drug use: No   Sexual activity: Not Currently  Other Topics Concern   Not on file  Social History Narrative   Originally from [place]   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[employer]   Completed 2 year community college   Has one daughter- Megan Middleton   2 grandchildren   Enjoys volunteering, GYM, crafts, reading   Social Determinants of Health   Financial Resource Strain: Low Risk  (08/19/2020)   Overall Financial Resource Strain (CARDIA)    Difficulty of Paying Living Expenses: Not hard at all  Food Insecurity: No Food Insecurity (08/19/2020)   Hunger Vital Sign    Worried About Running Out of Food in the Last Year:  Never true    Ran Out of Food in the Last Year: Never true  Transportation Needs: No Transportation Needs (08/19/2020)   PRAPARE - Hydrologist (Medical): No    Lack of Transportation (Non-Medical): No  Physical Activity: Sufficiently Active (08/19/2020)   Exercise Vital Sign    Days of Exercise per Week: 7 days    Minutes of Exercise per Session: 40 min  Stress: No Stress Concern Present (08/19/2020)   [place]    Feeling of Stress : Not at all  Social Connections: Moderately Integrated (08/19/2020)   Social Connection and Isolation Panel [NHANES]    Frequency of Communication with Friends and Family: More than three times a week    Frequency of Social Gatherings with Friends and Family: More than three times a week    Attends Religious Services: More than  4 times per year    Active Member of [name] or Organizations: Yes    Attends Archivist Meetings: 1 to 4 times per year    Marital Status: Widowed  Intimate Partner Violence: Not At Risk (08/19/2020)   Humiliation, Afraid, Rape, and Kick questionnaire    Fear of Current or Ex-Partner: No    Emotionally Abused: No    Physically Abused: No    Sexually Abused: No    Outpatient Medications Prior to Visit  Medication Sig Dispense Refill   amLODipine (NORVASC) 5 MG tablet TAKE 1 TABLET (5 MG TOTAL) BY MOUTH DAILY. 90 tablet 0   calcium carbonate (OS-CAL) 600 MG TABS Take 600 mg by mouth daily. With vitamin d     escitalopram (LEXAPRO) 20 MG tablet TAKE 1 TABLET BY MOUTH EVERY DAY 90 tablet 1   famotidine (PEPCID) 20 MG tablet Take 20 mg by mouth at bedtime.     NON FORMULARY JUICE PLUS     omeprazole (PRILOSEC) 20 MG capsule TAKE 1 CAPSULE BY MOUTH EVERY DAY (Patient taking differently: Take 20 mg by mouth daily. As needed) 90 capsule 1   No facility-administered medications prior to visit.    Allergies  Allergen Reactions   Doxycycline     GI side effects   Morphine Other (See Comments) and Nausea Only    Low blood pressure    Review of Systems  HENT:  Negative for sinus pain.        (+)head congestion       Objective:    Physical Exam Constitutional:      General: She is not in acute distress.    Appearance: Normal appearance. She is not ill-appearing.  HENT:     Head: Normocephalic and atraumatic.     Right Ear: External ear normal.     Left Ear: External ear normal.     Nose:     Right Sinus: Maxillary sinus tenderness present.     Left Sinus: Maxillary sinus tenderness present.     Mouth/Throat:     Mouth: Mucous membranes are moist.     Pharynx: Oropharynx is clear. No oropharyngeal exudate or posterior oropharyngeal erythema.  Eyes:     Extraocular Movements: Extraocular movements intact.     Pupils: Pupils are equal, round, and reactive to light.   Cardiovascular:     Rate and Rhythm: Normal rate and regular rhythm.     Heart sounds: Normal heart sounds. No murmur heard.    No gallop.  Pulmonary:     Effort: Pulmonary effort is  normal. No respiratory distress.     Breath sounds: Normal breath sounds. No wheezing or rales.  Skin:    General: Skin is warm and dry.  Neurological:     Mental Status: She is alert and oriented to person, place, and time.  Psychiatric:        Judgment: Judgment normal.     BP 131/67 (BP Location: Right Arm, Patient Position: Sitting, Cuff Size: Small)   Pulse 63   Temp 98.1 F (36.7 C) (Oral)   Resp 16   Wt 153 lb (69.4 kg)   SpO2 99%   BMI 23.96 kg/m  Wt Readings from Last 3 Encounters:  02/01/22 153 lb (69.4 kg)  11/23/21 151 lb (68.5 kg)  08/30/21 148 lb 6.4 oz (67.3 kg)       Assessment & Plan:  Acute maxillary sinusitis, recurrence not specified Assessment & Plan: New. Will rx with amoxicillin. Can't tolerate augmentin due to GI upset. Continue neti pot.  Continue flonase. Follow up if symptoms worsen or if symptoms fail to improve.    Other orders -     Amoxicillin; Take 1 capsule (500 mg total) by mouth 3 (three) times daily for 10 days.  Dispense: 30 capsule; Refill: 0    I, Nance Pear, NP, personally preformed the services described in this documentation.  All medical record entries made by the scribe were at my direction and in my presence.  I have reviewed the chart and discharge instructions (if applicable) and agree that the record reflects my personal performance and is accurate and complete. 02/01/2022   I,Megan Middleton,acting as a Education administrator for Nance Pear, NP.,have documented all relevant documentation on the behalf of Nance Pear, NP,as directed by  Nance Pear, NP while in the presence of Nance Pear, NP.   Nance Pear, NP

## 2022-02-01 NOTE — Assessment & Plan Note (Addendum)
New. Will rx with amoxicillin. Can't tolerate augmentin due to GI upset. Continue neti pot.  Continue flonase. Follow up if symptoms worsen or if symptoms fail to improve.

## 2022-02-09 DIAGNOSIS — H903 Sensorineural hearing loss, bilateral: Secondary | ICD-10-CM | POA: Diagnosis not present

## 2022-02-10 ENCOUNTER — Encounter: Payer: Self-pay | Admitting: Adult Health

## 2022-02-10 ENCOUNTER — Ambulatory Visit (INDEPENDENT_AMBULATORY_CARE_PROVIDER_SITE_OTHER): Payer: Medicare Other | Admitting: Adult Health

## 2022-02-10 ENCOUNTER — Ambulatory Visit: Payer: Medicare Other | Admitting: Adult Health

## 2022-02-10 VITALS — BP 110/70 | HR 78 | Temp 98.2°F | Ht 67.5 in | Wt 150.0 lb

## 2022-02-10 DIAGNOSIS — G4733 Obstructive sleep apnea (adult) (pediatric): Secondary | ICD-10-CM

## 2022-02-10 NOTE — Progress Notes (Signed)
$'@Patient'E$  ID: [name], female    DOB: 1955/11/28, 67 y.o.   MRN: [id number]  Chief Complaint  Patient presents with   Follow-up    Referring provider: [name], NP  HPI:  67 year old female seen for sleep consult February 23, 2021 found to have severe sleep apnea  TEST/EVENTS :  HST 04/21/21 >> AHI 39.9, SpO2 low 79%   02/10/2022 Follow up  OSA Patient presents for a 32-monthfollow-up.  She was seen February 2023 for sleep consult.  She was set up for home sleep study done 2023 that showed severe sleep apnea with AHI at 39.9/hour and SpO2 low at 79%.  Patient was started on CPAP.  Patient says she tries to wear CPAP every single night.  Definitely feels that she is benefiting from CPAP with decreased daytime sleepiness.  Patient wears a DreamWear nasal mask.  CPAP download shows good compliance with daily average usage at 9.5 hours.  Patient is on auto CPAP 5 to 15 cm H2O.  Daily average pressure 11.5 cm H2O.  AHI is 3.1/hour.  Patient does have 1 week where she did not have recording of her CPAP.  [place]  Took her travel CPAP.  Says she does not sleep without a CPAP   Allergies  Allergen Reactions   Doxycycline     GI side effects   Morphine Other (See Comments) and Nausea Only    Low blood pressure    Immunization History  Administered Date(s) Administered   Fluad Quad(high Dose 65+) 10/09/2018, 10/07/2019, 10/31/2020, 10/05/2021   Influenza Split 11/09/2010, 11/10/2011, 11/05/2012   Influenza, High Dose Seasonal PF 10/30/2016   Influenza,inj,Quad PF,6+ Mos 10/16/2017   Influenza-Unspecified 11/06/2013, 11/26/2014, 10/27/2015   PFIZER(Purple Top)SARS-COV-2 Vaccination 02/12/2019, 03/05/2019, 11/27/2019   Pneumococcal Conjugate-13 07/09/2018   Rabies Immune Globulin 11/12/2019, 11/15/2019, 11/19/2019, 11/26/2019   Tdap 09/24/2010, 11/12/2019   Zoster Recombinat (Shingrix) 10/16/2017, 12/24/2017   Zoster, Live  04/27/2014    Past Medical History:  Diagnosis Date   Allergy    seasonal   Basal cell carcinoma 2023   left temple, sees dermatolog   Gilberts syndrome    Gynecological examination    sees Dr. KPaula Compton   Hyperlipidemia    Hypertension    Osteopenia 10/02/2020   Squamous cell carcinoma    Sees Derm ATheora Gianotti  Tubular adenoma of colon 2015    Tobacco History: Social History   Tobacco Use  Smoking Status Never  Smokeless Tobacco Never   Counseling given: Not Answered   Outpatient Medications Prior to Visit  Medication Sig Dispense Refill   amLODipine (NORVASC) 5 MG tablet TAKE 1 TABLET (5 MG TOTAL) BY MOUTH DAILY. 90 tablet 0   calcium carbonate (OS-CAL) 600 MG TABS Take 600 mg by mouth daily. With vitamin d     escitalopram (LEXAPRO) 20 MG tablet TAKE 1 TABLET BY MOUTH EVERY DAY 90 tablet 1   famotidine (PEPCID) 20 MG tablet Take 20 mg by mouth at bedtime.     NON FORMULARY JUICE PLUS     omeprazole (PRILOSEC) 20 MG capsule TAKE 1 CAPSULE BY MOUTH EVERY DAY (Patient taking differently: Take 20 mg by mouth daily. As needed) 90 capsule 1   amoxicillin (AMOXIL) 500 MG capsule Take 1 capsule (500 mg total) by mouth 3 (three) times daily for 10 days. (Patient not taking: Reported on 02/10/2022) 30 capsule 0   No facility-administered medications prior to visit.  Review of Systems:   Constitutional:   No  weight loss, night sweats,  Fevers, chills, fatigue, or  lassitude.  HEENT:   No headaches,  Difficulty swallowing,  Tooth/dental problems, or  Sore throat,                No sneezing, itching, ear ache, nasal congestion, post nasal drip,   CV:  No chest pain,  Orthopnea, PND, swelling in lower extremities, anasarca, dizziness, palpitations, syncope.   GI  No heartburn, indigestion, abdominal pain, nausea, vomiting, diarrhea, change in bowel habits, loss of appetite, bloody stools.   Resp: No shortness of breath with exertion or at rest.  No excess  mucus, no productive cough,  No non-productive cough,  No coughing up of blood.  No change in color of mucus.  No wheezing.  No chest wall deformity  Skin: no rash or lesions.  GU: no dysuria, change in color of urine, no urgency or frequency.  No flank pain, no hematuria   MS:  No joint pain or swelling.  No decreased range of motion.  No back pain.    Physical Exam  BP 110/70 (BP Location: Left Arm, Patient Position: Sitting, Cuff Size: Normal)   Pulse 78   Temp 98.2 F (36.8 C) (Oral)   Ht 5' 7.5" (1.715 m)   Wt 150 lb (68 kg)   SpO2 97%   BMI 23.15 kg/m   GEN: A/Ox3; pleasant , NAD, well nourished    EACs-clear, TMs-wnl, NOSE-clear, THROAT-clear, no lesions, no postnasal drip or exudate noted.  Class 2-3 MP airway   NECK:  Supple w/ fair ROM; no JVD; normal carotid impulses w/o bruits; no thyromegaly or nodules palpated; no lymphadenopathy.    RESP  Clear  P & A; w/o, wheezes/ rales/ or rhonchi. no accessory muscle use, no dullness to percussion  CARD:  RRR, no m/r/g, no peripheral edema, pulses intact, no cyanosis or clubbing.  GI:   Soft & nt; nml bowel sounds; no organomegaly or masses detected.   Musco: Warm bil, no deformities or joint swelling noted.   Neuro: alert, no focal deficits noted.    Skin: Warm, no lesions or rashes    Lab Results:  CBC    Component Value Date/Time   WBC 6.2 11/23/2021 0924   RBC 4.49 11/23/2021 0924   HGB 14.2 11/23/2021 0924   HCT 41.5 11/23/2021 0924   PLT 261.0 11/23/2021 0924   MCV 92.5 11/23/2021 0924   MCHC 34.3 11/23/2021 0924   RDW 13.1 11/23/2021 0924   LYMPHSABS 1.5 11/23/2021 0924   MONOABS 0.3 11/23/2021 0924   EOSABS 0.2 11/23/2021 0924   BASOSABS 0.1 11/23/2021 0924    BMET    Component Value Date/Time   NA 140 11/23/2021 0924   NA 140 12/15/2019 1120   K 3.6 11/23/2021 0924   CL 100 11/23/2021 0924   CO2 31 11/23/2021 0924   GLUCOSE 94 11/23/2021 0924   BUN 18 11/23/2021 0924   BUN 13  12/15/2019 1120   CREATININE 0.76 11/23/2021 0924   CALCIUM 9.3 11/23/2021 0924   GFRNONAA 77 12/15/2019 1120   GFRAA 89 12/15/2019 1120    BNP No results found for: "BNP"  ProBNP No results found for: "PROBNP"  Imaging: No results found.        No data to display          No results found for: "NITRICOXIDE"      Assessment & Plan:   OSA (  obstructive sleep apnea) Excellent control and compliance on CPAP   Plan  Patient Instructions  Continue on CPAP at bedtime.  Keep up good . Continue to have healthy weight Activity as tolerated Do not drive if sleepy Saline nasal spray Twice daily   Saline nasal gel At bedtime   Follow-up in 1 year with Dr. Halford Chessman or Saralee Bolick NP and As needed        Rexene Edison, NP 02/10/2022

## 2022-02-10 NOTE — Assessment & Plan Note (Signed)
Excellent control and compliance on CPAP   Plan  Patient Instructions  Continue on CPAP at bedtime.  Keep up good . Continue to have healthy weight Activity as tolerated Do not drive if sleepy Saline nasal spray Twice daily   Saline nasal gel At bedtime   Follow-up in 1 year with Dr. Halford Chessman or Indica Marcott NP and As needed

## 2022-02-10 NOTE — Patient Instructions (Signed)
Continue on CPAP at bedtime.  Keep up good . Continue to have healthy weight Activity as tolerated Do not drive if sleepy Saline nasal spray Twice daily   Saline nasal gel At bedtime   Follow-up in 1 year with Dr. Halford Chessman or Renee Beale NP and As needed

## 2022-02-10 NOTE — Progress Notes (Signed)
Reviewed and agree with assessment/plan.   Chesley Mires, MD [facility] Pulmonary/Critical Care 02/10/2022, 2:32 PM Pager:  [phone]

## 2022-02-14 ENCOUNTER — Encounter: Payer: Self-pay | Admitting: Gastroenterology

## 2022-02-14 ENCOUNTER — Ambulatory Visit (INDEPENDENT_AMBULATORY_CARE_PROVIDER_SITE_OTHER): Payer: Medicare Other | Admitting: Gastroenterology

## 2022-02-14 ENCOUNTER — Ambulatory Visit: Payer: Medicare Other | Admitting: Gastroenterology

## 2022-02-14 VITALS — BP 124/70 | HR 64 | Ht 67.0 in | Wt 150.1 lb

## 2022-02-14 DIAGNOSIS — Z85828 Personal history of other malignant neoplasm of skin: Secondary | ICD-10-CM | POA: Diagnosis not present

## 2022-02-14 DIAGNOSIS — D1801 Hemangioma of skin and subcutaneous tissue: Secondary | ICD-10-CM | POA: Diagnosis not present

## 2022-02-14 DIAGNOSIS — K642 Third degree hemorrhoids: Secondary | ICD-10-CM

## 2022-02-14 DIAGNOSIS — K5900 Constipation, unspecified: Secondary | ICD-10-CM | POA: Diagnosis not present

## 2022-02-14 DIAGNOSIS — L578 Other skin changes due to chronic exposure to nonionizing radiation: Secondary | ICD-10-CM | POA: Diagnosis not present

## 2022-02-14 DIAGNOSIS — L570 Actinic keratosis: Secondary | ICD-10-CM | POA: Diagnosis not present

## 2022-02-14 DIAGNOSIS — Z86008 Personal history of in-situ neoplasm of other site: Secondary | ICD-10-CM | POA: Diagnosis not present

## 2022-02-14 DIAGNOSIS — R194 Change in bowel habit: Secondary | ICD-10-CM

## 2022-02-14 DIAGNOSIS — W908XXS Exposure to other nonionizing radiation, sequela: Secondary | ICD-10-CM | POA: Diagnosis not present

## 2022-02-14 DIAGNOSIS — L814 Other melanin hyperpigmentation: Secondary | ICD-10-CM | POA: Diagnosis not present

## 2022-02-14 NOTE — Progress Notes (Signed)
    Assessment     Change in bowel habits, constipation Grade III internal hemorrhoids   Recommendations    Benefiber daily, adequate daily water intake Miralax qd prn Prep H supp qd prn, rectal care instructions  Consider colonoscopy, hemorrhoid banding - discuss at REV REV in 1 month    HPI    This is a 67 year old female who relates a change in bowel habits beginning about 9 months ago with constipation.  She states she was taking MiraLAX for a while which was effective however recently she is generally having normal bowel movements with intermittent constipation instead of constant constipation.  She has noted prolapsing hemorrhoids that she manually reduces.  She does not relate any medication or diet changes around the time of her bowel habit changes.  Colonoscopy performed 3-1/2 years ago below. Denies weight loss, abdominal pain, diarrhea, change in stool caliber, melena, hematochezia, nausea, vomiting, dysphagia, reflux symptoms, chest pain.   Colonoscopy 06/2018   Labs / Imaging       Latest Ref Rng & Units 12/24/2020    9:36 AM 06/29/2020    7:52 AM 12/15/2019   11:20 AM  Hepatic Function  Total Protein 6.0 - 8.3 g/dL 6.7  6.8  7.0   Albumin 3.5 - 5.2 g/dL 4.7  4.7  5.0   AST 0 - 37 U/L '15  14  16   '$ ALT 0 - 35 U/L '11  10  14   '$ Alk Phosphatase 39 - 117 U/L 99  105  105   Total Bilirubin 0.2 - 1.2 mg/dL 2.5  2.3  2.7        Latest Ref Rng & Units 11/23/2021    9:24 AM 06/29/2020    7:52 AM 10/16/2017   12:48 PM  CBC  WBC 4.0 - 10.5 K/uL 6.2  5.4  7.6   Hemoglobin 12.0 - 15.0 g/dL 14.2  14.4  14.7   Hematocrit 36.0 - 46.0 % 41.5  42.2  42.8   Platelets 150.0 - 400.0 K/uL 261.0  238.0  241.0     Current Medications, Allergies, Past Medical History, Past Surgical History, Family History and Social History were reviewed in [employer] record.   Physical Exam: General: Well developed, well nourished, no acute distress Head: Normocephalic  and atraumatic Eyes: Sclerae anicteric, EOMI Ears: Normal auditory acuity Mouth: No deformities or lesions noted Lungs: Clear throughout to auscultation Heart: Regular rate and rhythm; No murmurs, rubs or bruits Abdomen: Soft, non tender and non distended. No masses, hepatosplenomegaly or hernias noted. Normal Bowel sounds Rectal: No lesions, no tenderness, Hemoccult negative mucus in the vault Musculoskeletal: Symmetrical with no gross deformities  Pulses:  Normal pulses noted Extremities: No edema or deformities noted Neurological: Alert oriented x 4, grossly nonfocal Psychological:  Alert and cooperative. Normal mood and affect   Lisvet Rasheed T. Fuller Plan, MD 02/14/2022, 10:45 AM

## 2022-02-14 NOTE — Patient Instructions (Signed)
Start over the [employer] daily and Miralax daily as needed for constipation.   You can also use over the counter preparation H suppositories as needed for bleeding.   RECTAL CARE INSTRUCTIONS:  1. Sitz Baths twice a day for 10 minutes each. 2. Thoroughly clean and dry the rectum. 3. Put Tucks pad against the rectum at night. 4. Clean the rectum with Balenol lotion after each bowel movement.   The [facility] providers would like to encourage you to use [facility] to communicate with providers for non-urgent requests or questions.  Due to long hold times on the telephone, sending your provider a message by [facility] may be a faster and more efficient way to get a response.  Please allow 48 business hours for a response.  Please remember that this is for non-urgent requests.   Thank you for choosing me and [facility].  Pricilla Riffle. Dagoberto Ligas., MD., Marval Regal

## 2022-02-18 ENCOUNTER — Other Ambulatory Visit: Payer: Self-pay | Admitting: Family

## 2022-03-20 ENCOUNTER — Ambulatory Visit (INDEPENDENT_AMBULATORY_CARE_PROVIDER_SITE_OTHER): Payer: Medicare Other | Admitting: Gastroenterology

## 2022-03-20 ENCOUNTER — Encounter: Payer: Self-pay | Admitting: Gastroenterology

## 2022-03-20 VITALS — BP 110/70 | HR 72 | Ht 67.5 in | Wt 151.5 lb

## 2022-03-20 DIAGNOSIS — K5900 Constipation, unspecified: Secondary | ICD-10-CM

## 2022-03-20 DIAGNOSIS — R194 Change in bowel habit: Secondary | ICD-10-CM | POA: Diagnosis not present

## 2022-03-20 DIAGNOSIS — K642 Third degree hemorrhoids: Secondary | ICD-10-CM | POA: Diagnosis not present

## 2022-03-20 MED ORDER — NA SULFATE-K SULFATE-MG SULF 17.5-3.13-1.6 GM/177ML PO SOLN: 1.0000 | Freq: Once | ORAL | 0 refills | Status: AC

## 2022-03-20 NOTE — Progress Notes (Signed)
    Assessment     Change in bowel habits, constipation  Grade III internal hemorrhoids   Recommendations    Schedule colonoscopy. The risks (including bleeding, perforation, infection, missed lesions, medication reactions and possible hospitalization or surgery if complications occur), benefits, and alternatives to colonoscopy with possible biopsy and possible polypectomy were discussed with the patient and they consent to proceed.   Continue fiber supplements qd, MiraLAX qd prn, Preparation H suppositories qd prn and rectal care instructions.  Consider hemorrhoid banding in the future if she has persistent hemorrhoidal symptoms.   HPI    This is a 67 year old female in for follow-up of change in bowel habits, constipation and grade 3 internal hemorrhoids.  With treatment from her prior visit she has not had hemorrhoidal prolapse.  She states she is having more regular bowel movements without straining.  Occasionally her stools are smaller and occasionally larger.  She still has a sense of urgency or incomplete evacuation.  As her hemorrhoid symptoms have substantially improved she does not want to proceed with hemorrhoid banding at this time.  She remains concerned about her change in bowel habits and confirming that hemorrhoids are the source of her prolapse.   Labs / Imaging       Latest Ref Rng & Units 12/24/2020    9:36 AM 06/29/2020    7:52 AM 12/15/2019   11:20 AM  Hepatic Function  Total Protein 6.0 - 8.3 g/dL 6.7  6.8  7.0   Albumin 3.5 - 5.2 g/dL 4.7  4.7  5.0   AST 0 - 37 U/L 15  14  16   $ ALT 0 - 35 U/L 11  10  14   $ Alk Phosphatase 39 - 117 U/L 99  105  105   Total Bilirubin 0.2 - 1.2 mg/dL 2.5  2.3  2.7        Latest Ref Rng & Units 11/23/2021    9:24 AM 06/29/2020    7:52 AM 10/16/2017   12:48 PM  CBC  WBC 4.0 - 10.5 K/uL 6.2  5.4  7.6   Hemoglobin 12.0 - 15.0 g/dL 14.2  14.4  14.7   Hematocrit 36.0 - 46.0 % 41.5  42.2  42.8   Platelets 150.0 - 400.0 K/uL 261.0   238.0  241.0     Current Medications, Allergies, Past Medical History, Past Surgical History, Family History and Social History were reviewed in [employer] record.   Physical Exam: General: Well developed, well nourished, no acute distress Head: Normocephalic and atraumatic Eyes: Sclerae anicteric, EOMI Ears: Normal auditory acuity Mouth: No deformities or lesions noted Lungs: Clear throughout to auscultation Heart: Regular rate and rhythm; No murmurs, rubs or bruits Abdomen: Soft, non tender and non distended. No masses, hepatosplenomegaly or hernias noted. Normal Bowel sounds Rectal: Deferred to colonoscopy  Musculoskeletal: Symmetrical with no gross deformities  Pulses:  Normal pulses noted Extremities: No edema or deformities noted Neurological: Alert oriented x 4, grossly nonfocal Psychological:  Alert and cooperative. Normal mood and affect   Courtni Balash T. Fuller Plan, MD 03/20/2022, 10:24 AM

## 2022-03-20 NOTE — Patient Instructions (Signed)
You have been scheduled for a colonoscopy. Please follow written instructions given to you at your visit today.  Please pick up your prep supplies at the pharmacy within the next 1-3 days. If you use inhalers (even only as needed), please bring them with you on the day of your procedure.  The [facility] providers would like to encourage you to use MYCHART to communicate with providers for non-urgent requests or questions.  Due to long hold times on the telephone, sending your provider a message by MYCHART may be a faster and more efficient way to get a response.  Please allow 48 business hours for a response.  Please remember that this is for non-urgent requests.   Due to recent changes in healthcare laws, you may see the results of your imaging and laboratory studies on MyChart before your provider has had a chance to review them.  We understand that in some cases there may be results that are confusing or concerning to you. Not all laboratory results come back in the same time frame and the provider may be waiting for multiple results in order to interpret others.  Please give us 48 hours in order for your provider to thoroughly review all the results before contacting the office for clarification of your results.   Thank you for choosing me and [facility].  Malcolm T. Stark, Jr., MD., FACG  

## 2022-04-05 ENCOUNTER — Ambulatory Visit (AMBULATORY_SURGERY_CENTER): Payer: Medicare Other | Admitting: Gastroenterology

## 2022-04-05 ENCOUNTER — Encounter: Payer: Self-pay | Admitting: Gastroenterology

## 2022-04-05 VITALS — BP 111/70 | HR 70 | Temp 98.4°F | Resp 15 | Ht 67.0 in | Wt 151.0 lb

## 2022-04-05 DIAGNOSIS — R194 Change in bowel habit: Secondary | ICD-10-CM

## 2022-04-05 DIAGNOSIS — K5900 Constipation, unspecified: Secondary | ICD-10-CM

## 2022-04-05 MED ORDER — SODIUM CHLORIDE 0.9 % IV SOLN: 500.0000 mL | Freq: Once | INTRAVENOUS | Status: DC

## 2022-04-05 NOTE — Progress Notes (Signed)
See 03/20/2022 H&P, no changes 

## 2022-04-05 NOTE — Op Note (Addendum)
[place] Patient Name: Megan Middleton Procedure Date: 04/05/2022 1:26 PM MRN: [id number] Endoscopist: Ladene Artist , MD, [id number] Age: 67 Referring MD:  Date of Birth: [birth year] Gender: Female Account #: [id number] Procedure:                Colonoscopy Indications:              Change in bowel habits, Constipation Medicines:                Monitored Anesthesia Care Procedure:                Pre-Anesthesia Assessment:                           - Prior to the procedure, a History and Physical                            was performed, and patient medications and                            allergies were reviewed. The patient's tolerance of                            previous anesthesia was also reviewed. The risks                            and benefits of the procedure and the sedation                            options and risks were discussed with the patient.                            All questions were answered, and informed consent                            was obtained. Prior Anticoagulants: The patient has                            taken no anticoagulant or antiplatelet agents. ASA                            Grade Assessment: II - A patient with mild systemic                            disease. After reviewing the risks and benefits,                            the patient was deemed in satisfactory condition to                            undergo the procedure.                           After obtaining informed consent, the colonoscope  was passed under direct vision. Throughout the                            procedure, the patient's blood pressure, pulse, and                            oxygen saturations were monitored continuously. The                            CF [id number] was introduced through the anus                            and advanced to the the cecum, identified by                            appendiceal orifice  and ileocecal valve. The                            ileocecal valve, appendiceal orifice, and rectum                            were photographed. The quality of the bowel                            preparation was excellent. The colonoscopy was                            performed without difficulty. The patient tolerated                            the procedure well. Scope In: 1:33:38 PM Scope Out: 1:50:02 PM Scope Withdrawal Time: 0 hours 11 minutes 43 seconds  Total Procedure Duration: 0 hours 16 minutes 24 seconds  Findings:                 The perianal and digital rectal examinations were                            normal.                           A few small-mouthed diverticula were found in the                            left colon.                           Internal hemorrhoids were found during                            retroflexion. The hemorrhoids were moderate and                            Grade III (internal hemorrhoids that prolapse but  require manual reduction).                           The exam was otherwise without abnormality on                            direct and retroflexion views. Complications:            No immediate complications. Estimated blood loss:                            None. Estimated Blood Loss:     Estimated blood loss: none. Impression:               - Mild diverticulosis in the left colon.                           - Internal hemorrhoids.                           - The examination was otherwise normal on direct                            and retroflexion views.                           - No specimens collected. Recommendation:           - Repeat colonoscopy in 10 years for surveillance.                           - Patient has a contact number available for                            emergencies. The signs and symptoms of potential                            delayed complications were discussed with the                             patient. Return to normal activities tomorrow.                            Written discharge instructions were provided to the                            patient.                           - High fiber diet.                           - Continue present medications. Ladene Artist, MD 04/05/2022 1:52:50 PM This report has been signed electronically.

## 2022-04-05 NOTE — Progress Notes (Signed)
Sedate, gd SR's, VSS, report to RN 

## 2022-04-05 NOTE — Progress Notes (Signed)
Pt's states no medical or surgical changes since previsit or office visit. 

## 2022-04-05 NOTE — Patient Instructions (Signed)
-  Handout on high fiber diet, hemorrhoids, and diverticulosis provided -repeat colonoscopy in 10 years for surveillance recommended.  -Continue present medications   YOU HAD AN ENDOSCOPIC PROCEDURE TODAY AT [facility] [place]:   Refer to the procedure report that was given to you for any specific questions about what was found during the examination.  If the procedure report does not answer your questions, please call your gastroenterologist to clarify.  If you requested that your care partner not be given the details of your procedure findings, then the procedure report has been included in a sealed envelope for you to review at your convenience later.  YOU SHOULD EXPECT: Some feelings of bloating in the abdomen. Passage of more gas than usual.  Walking can help get rid of the air that was put into your GI tract during the procedure and reduce the bloating. If you had a lower endoscopy (such as a colonoscopy or flexible sigmoidoscopy) you may notice spotting of blood in your stool or on the toilet paper. If you underwent a bowel prep for your procedure, you may not have a normal bowel movement for a few days.  Please Note:  You might notice some irritation and congestion in your nose or some drainage.  This is from the oxygen used during your procedure.  There is no need for concern and it should clear up in a day or so.  SYMPTOMS TO REPORT IMMEDIATELY:  Following lower endoscopy (colonoscopy or flexible sigmoidoscopy):  Excessive amounts of blood in the stool  Significant tenderness or worsening of abdominal pains  Swelling of the abdomen that is new, acute  Fever of 100F or higher  For urgent or emergent issues, a gastroenterologist can be reached at any hour by calling [phone]. Do not use MyChart messaging for urgent concerns.    DIET:  We do recommend a small meal at first, but then you may proceed to your regular diet.  Drink plenty of fluids but you should avoid  alcoholic beverages for 24 hours.  ACTIVITY:  You should plan to take it easy for the rest of today and you should NOT DRIVE or use heavy machinery until tomorrow (because of the sedation medicines used during the test).    FOLLOW UP: Our staff will call the number listed on your records the next business day following your procedure.  We will call around 7:15- 8:00 am to check on you and address any questions or concerns that you may have regarding the information given to you following your procedure. If we do not reach you, we will leave a message.     If any biopsies were taken you will be contacted by phone or by letter within the next 1-3 weeks.  Please call us at [phone] if you have not heard about the biopsies in 3 weeks.    SIGNATURES/CONFIDENTIALITY: You and/or your care partner have signed paperwork which will be entered into your electronic medical record.  These signatures attest to the fact that that the information above on your After Visit Summary has been reviewed and is understood.  Full responsibility of the confidentiality of this discharge information lies with you and/or your care-partner.

## 2022-04-06 ENCOUNTER — Telehealth: Payer: Self-pay

## 2022-04-06 NOTE — Telephone Encounter (Signed)
Follow up call to pt, no answer.  

## 2022-04-12 IMAGING — MG MM DIGITAL SCREENING BILAT W/ TOMO AND CAD
8 series · 9 of 24 positions shown · non-contrast
Comparison: Previous exam(s).

CLINICAL DATA: Screening.

EXAM:
DIGITAL SCREENING BILATERAL MAMMOGRAM WITH TOMOSYNTHESIS AND CAD
TECHNIQUE: Bilateral screening digital craniocaudal and mediolateral oblique
mammograms were obtained. Bilateral screening digital breast
tomosynthesis was performed. The images were evaluated with
computer-aided detection.

[L CC synth-2D]
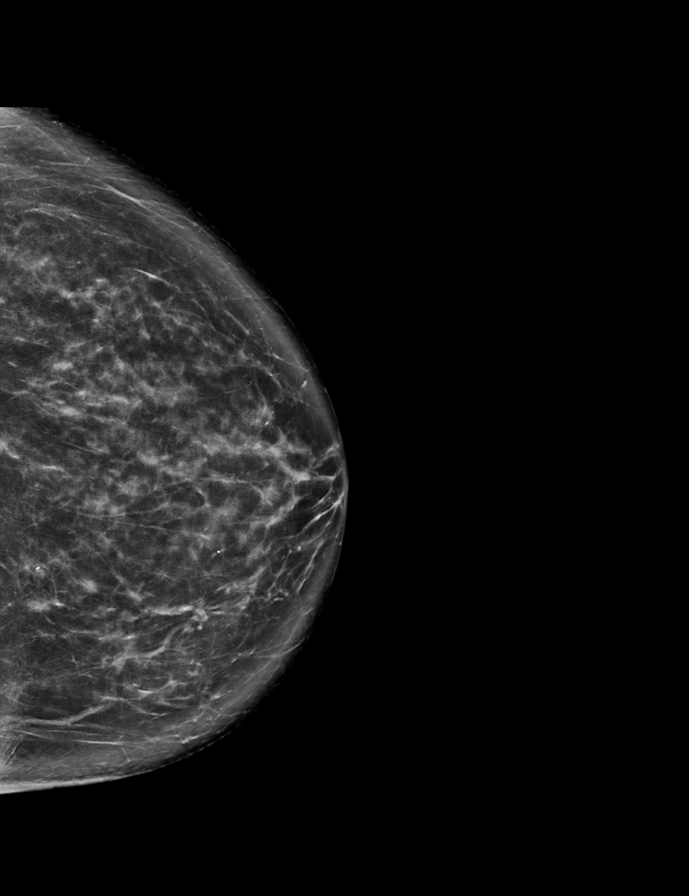

[R CC synth-2D]
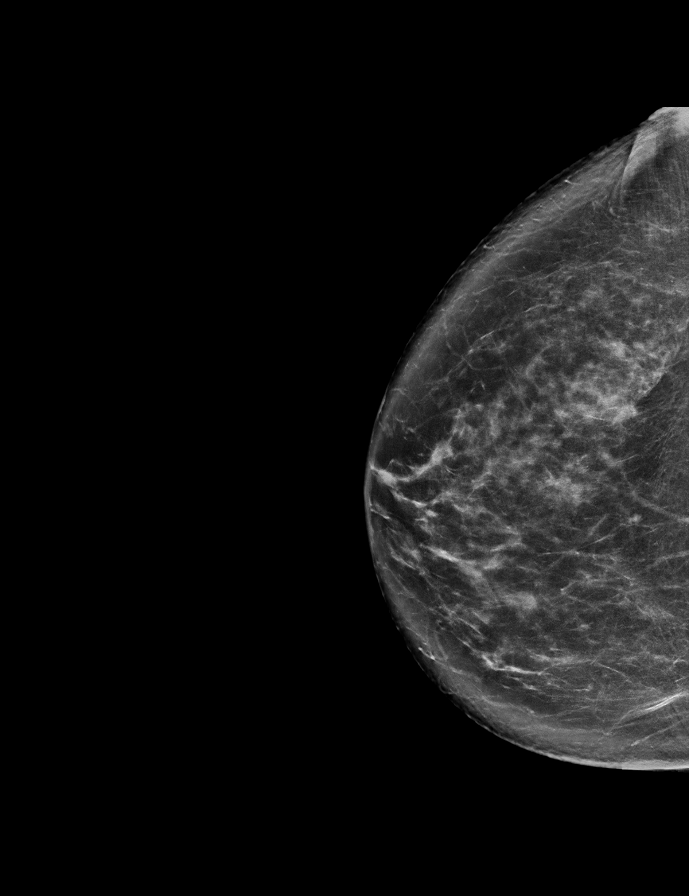

[R MLO synth-2D]
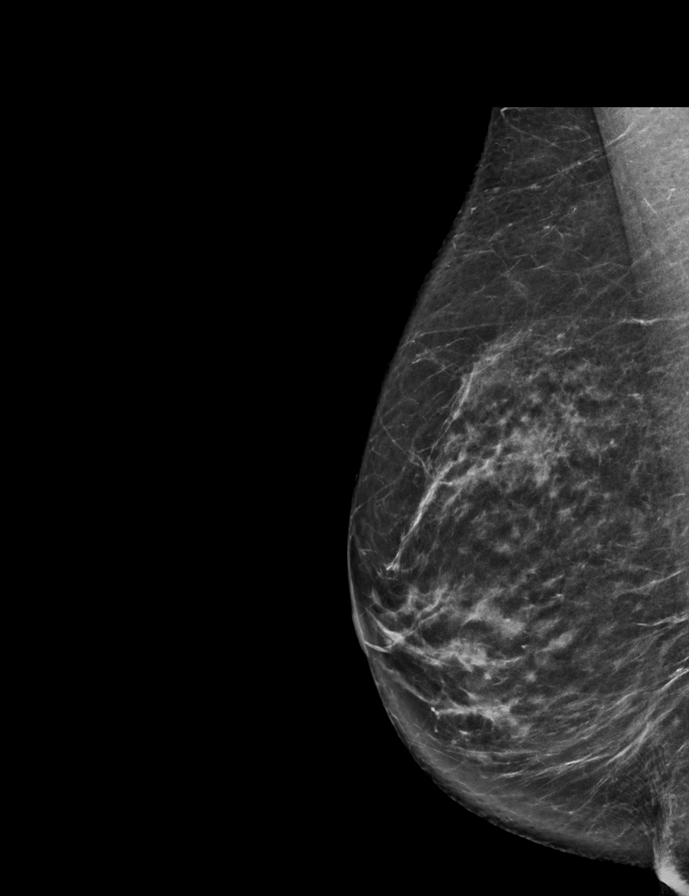

[L MLO synth-2D]
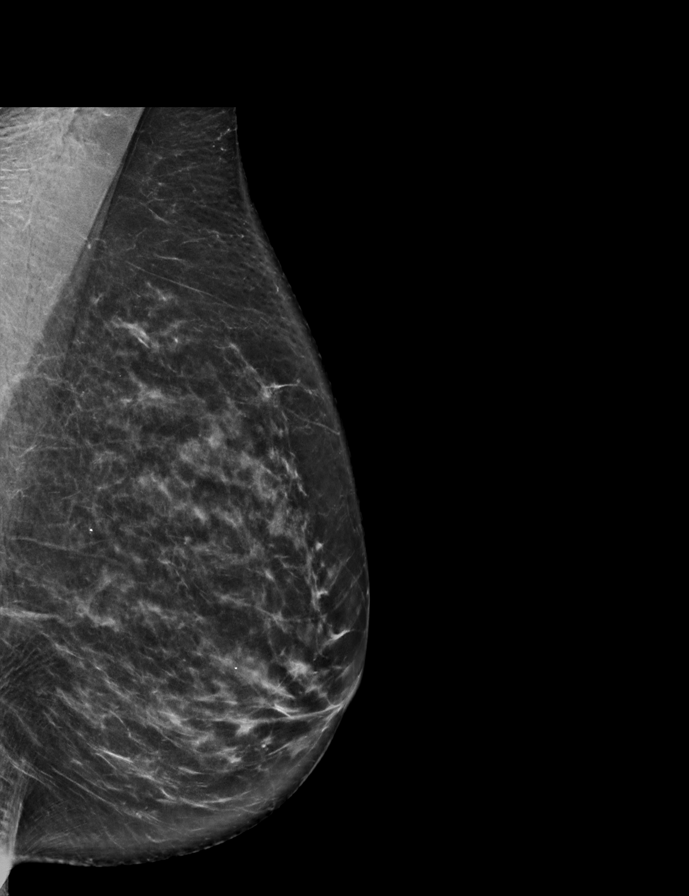

[L MLO tomo · 2 of 76 frames shown]
[frame 25/76]
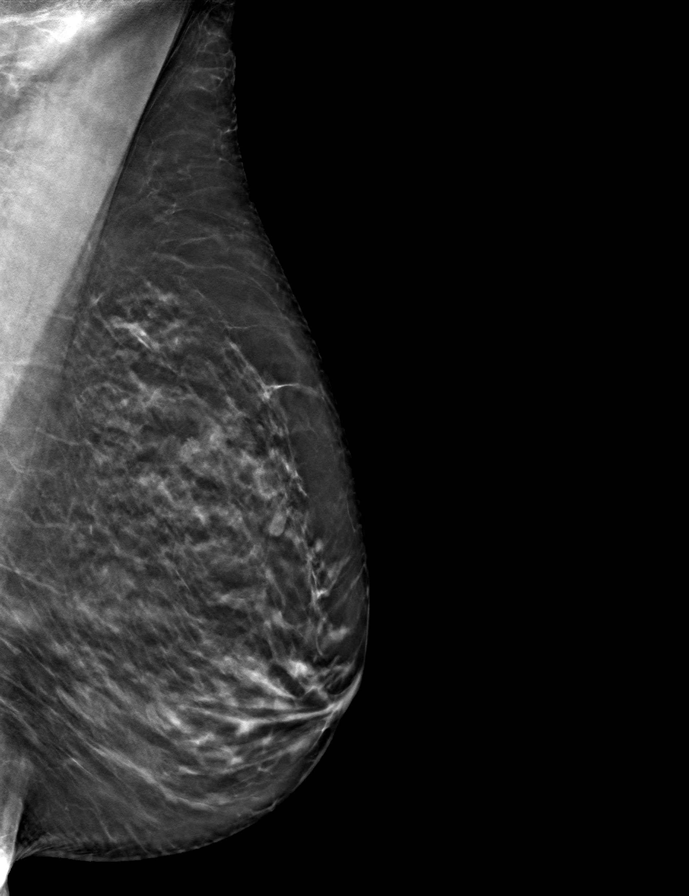
[frame 39/76]
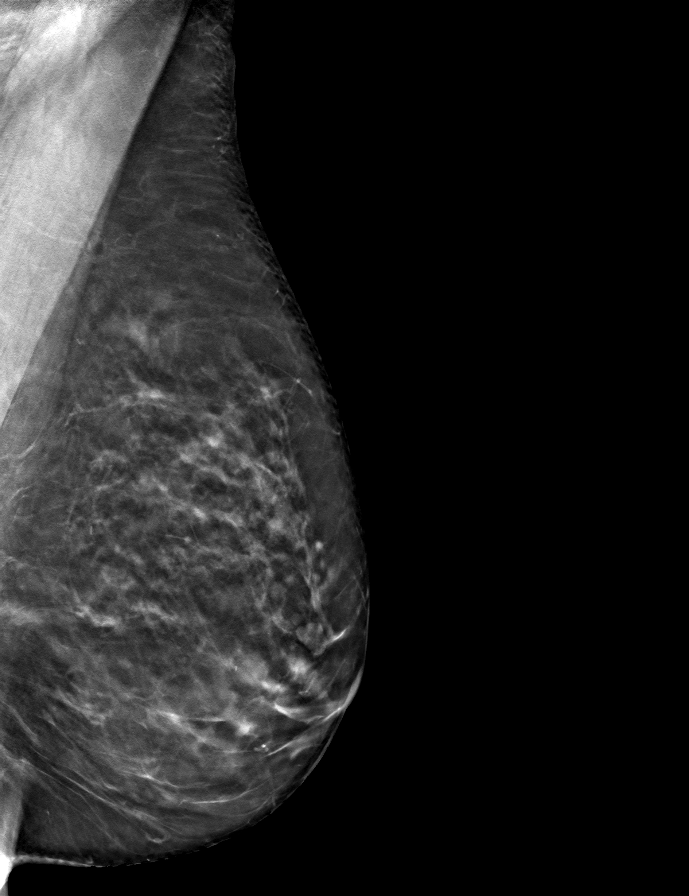

[L CC tomo · tomo slice 41/80.0]
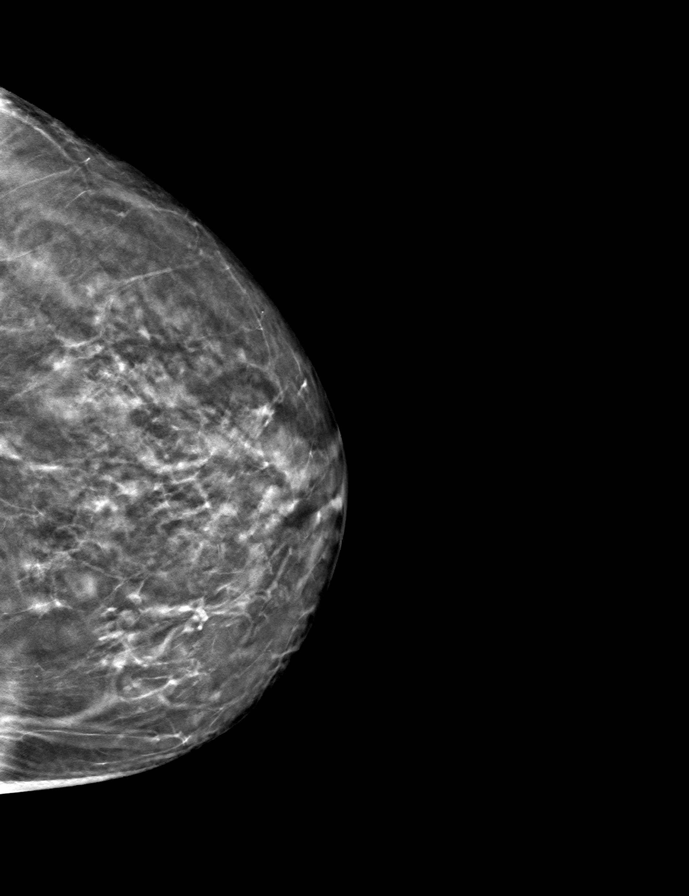

[R MLO tomo · tomo slice 37/72.0]
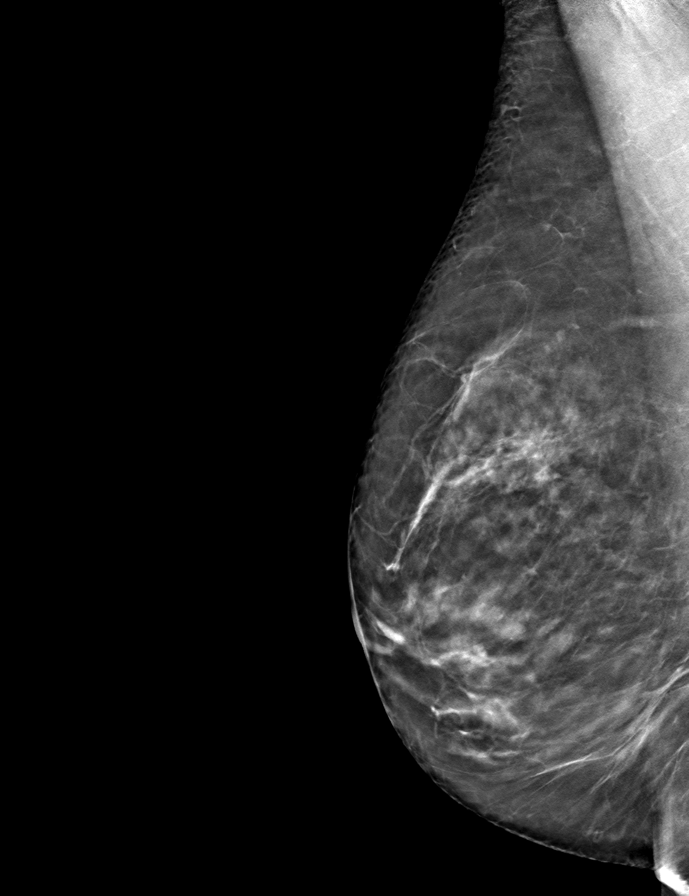

[R CC tomo · tomo slice 40/79.0]
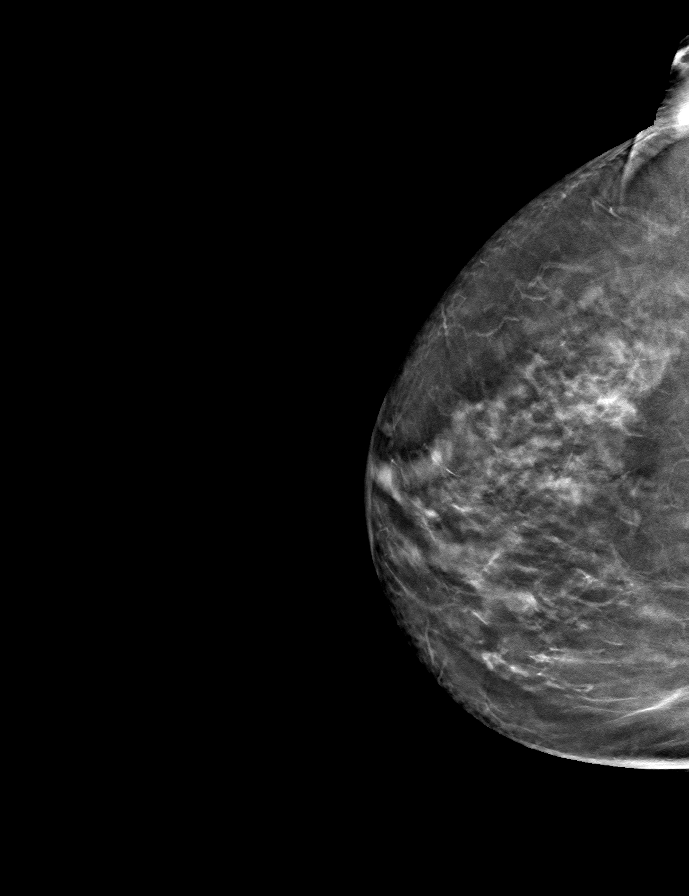

[9 of 24 positions shown; findings below may reference images not displayed]

ACR Breast Density Category c: The breast tissue is heterogeneously
dense, which may obscure small masses.
FINDINGS: There are no findings suspicious for malignancy. The images were
evaluated with computer-aided detection.
IMPRESSION: No mammographic evidence of malignancy. A result letter of this
screening mammogram will be mailed directly to the patient.

RECOMMENDATION:
Screening mammogram in one year. (Code:T4-5-GWO)

BI-RADS CATEGORY  1: Negative.

## 2022-04-19 DIAGNOSIS — L821 Other seborrheic keratosis: Secondary | ICD-10-CM | POA: Diagnosis not present

## 2022-04-19 DIAGNOSIS — L570 Actinic keratosis: Secondary | ICD-10-CM | POA: Diagnosis not present

## 2022-04-19 DIAGNOSIS — Z85828 Personal history of other malignant neoplasm of skin: Secondary | ICD-10-CM | POA: Diagnosis not present

## 2022-05-17 ENCOUNTER — Other Ambulatory Visit: Payer: Self-pay | Admitting: Family

## 2022-05-24 ENCOUNTER — Ambulatory Visit: Payer: Medicare Other | Admitting: Family

## 2022-06-01 DIAGNOSIS — L570 Actinic keratosis: Secondary | ICD-10-CM | POA: Diagnosis not present

## 2022-06-01 DIAGNOSIS — L820 Inflamed seborrheic keratosis: Secondary | ICD-10-CM | POA: Diagnosis not present

## 2022-06-11 ENCOUNTER — Other Ambulatory Visit: Payer: Self-pay | Admitting: Family

## 2022-07-26 ENCOUNTER — Other Ambulatory Visit: Payer: Self-pay | Admitting: Obstetrics and Gynecology

## 2022-07-26 DIAGNOSIS — Z1231 Encounter for screening mammogram for malignant neoplasm of breast: Secondary | ICD-10-CM

## 2022-08-09 ENCOUNTER — Ambulatory Visit
Admission: RE | Admit: 2022-08-09 | Discharge: 2022-08-09 | Disposition: A | Discharge status: Home / Self Care | Payer: Medicare Other | Source: Ambulatory Visit | Attending: Obstetrics and Gynecology | Admitting: Obstetrics and Gynecology

## 2022-08-09 DIAGNOSIS — Z1231 Encounter for screening mammogram for malignant neoplasm of breast: Secondary | ICD-10-CM | POA: Diagnosis not present

## 2022-08-20 ENCOUNTER — Other Ambulatory Visit: Payer: Self-pay | Admitting: Family

## 2022-08-24 DIAGNOSIS — L814 Other melanin hyperpigmentation: Secondary | ICD-10-CM | POA: Diagnosis not present

## 2022-08-24 DIAGNOSIS — Z86008 Personal history of in-situ neoplasm of other site: Secondary | ICD-10-CM | POA: Diagnosis not present

## 2022-08-24 DIAGNOSIS — L570 Actinic keratosis: Secondary | ICD-10-CM | POA: Diagnosis not present

## 2022-08-24 DIAGNOSIS — X32XXXS Exposure to sunlight, sequela: Secondary | ICD-10-CM | POA: Diagnosis not present

## 2022-08-24 DIAGNOSIS — L821 Other seborrheic keratosis: Secondary | ICD-10-CM | POA: Diagnosis not present

## 2022-08-24 DIAGNOSIS — Z85828 Personal history of other malignant neoplasm of skin: Secondary | ICD-10-CM | POA: Diagnosis not present

## 2022-08-27 DIAGNOSIS — S9032XA Contusion of left foot, initial encounter: Secondary | ICD-10-CM | POA: Diagnosis not present

## 2022-09-11 ENCOUNTER — Ambulatory Visit (INDEPENDENT_AMBULATORY_CARE_PROVIDER_SITE_OTHER): Payer: Medicare Other | Admitting: *Deleted

## 2022-09-11 VITALS — BP 122/71 | HR 73 | Ht 67.0 in | Wt 150.0 lb

## 2022-09-11 DIAGNOSIS — Z0000 Encounter for general adult medical examination without abnormal findings: Secondary | ICD-10-CM

## 2022-09-11 NOTE — Progress Notes (Signed)
Subjective:   Megan Middleton is a 67 y.o. female who presents for Medicare Annual (Subsequent) preventive examination.  Visit Complete: In person  Review of Systems     Cardiac Risk Factors include: advanced age (>50men, >33 women);dyslipidemia;hypertension     Objective:    Today's Vitals   09/11/22 0932  BP: 122/71  Pulse: 73  Weight: 150 lb (68 kg)  Height: 5\' 7"  (1.702 m)   Body mass index is 23.49 kg/m.     09/11/2022    9:38 AM 08/30/2021    3:43 PM 08/19/2020    8:15 AM 08/13/2019    8:11 AM 03/08/2014   12:09 PM 10/08/2013    7:48 AM 09/24/2013    8:03 AM  Advanced Directives  Does Patient Have a Medical Advance Directive? Yes Yes Yes Yes No Yes Yes  Type of Estate agent of [place];Living will Healthcare Power of [place];Living will Healthcare Power of [place];Living will Healthcare Power of [place];Living will  Healthcare Power of [employer] of [place];Living will  Does patient want to make changes to medical advance directive? No - Patient declined No - Patient declined  No - Patient declined     Copy of Healthcare Power of Attorney in Chart? No - copy requested No - copy requested No - copy requested No - copy requested     Would patient like information on creating a medical advance directive?     No - patient declined information      Current Medications (verified) Outpatient Encounter Medications as of 09/11/2022  Medication Sig   amLODipine (NORVASC) 5 MG tablet TAKE 1 TABLET (5 MG TOTAL) BY MOUTH DAILY.   calcium carbonate (OS-CAL) 600 MG TABS Take 600 mg by mouth daily. With vitamin d   escitalopram (LEXAPRO) 20 MG tablet TAKE 1 TABLET BY MOUTH EVERY DAY   famotidine (PEPCID) 20 MG tablet Take 20 mg by mouth at bedtime.   NON FORMULARY JUICE PLUS   omeprazole (PRILOSEC) 20 MG capsule TAKE 1 CAPSULE BY MOUTH EVERY DAY   No facility-administered encounter medications on file as of 09/11/2022.    Allergies  (verified) Doxycycline and Morphine   History: Past Medical History:  Diagnosis Date   Allergy    seasonal   Basal cell carcinoma 2023   left temple, sees dermatolog   Gilberts syndrome    Gynecological examination    sees Dr. Huel Cote    Hyperlipidemia    Hypertension    Osteopenia 10/02/2020   Squamous cell carcinoma    Sees Derm Royden Purl   Tubular adenoma of colon 2015   Past Surgical History:  Procedure Laterality Date   collar bone surgery     jan,2020   COLONOSCOPY  10/08/2013   per Dr. Russella Dar, adenomatous polyps, repear in 5 yrs   FRACTURE SURGERY  January 2020   Clavicle left   TUBAL LIGATION     Family History  Problem Relation Age of Onset   Hypertension Father    Breast cancer Sister    Lymphoma Mother    Leukemia Mother    Cancer Mother    Colon cancer Neg Hx    Esophageal cancer Neg Hx    Rectal cancer Neg Hx    Stomach cancer Neg Hx    Colon polyps Neg Hx    Social History   Socioeconomic History   Marital status: Widowed    Spouse name: Not on file   Number of children: Not on  file   Years of education: Not on file   Highest education level: Not on file  Occupational History   Not on file  Tobacco Use   Smoking status: Never   Smokeless tobacco: Never  Vaping Use   Vaping status: Not on file  Substance and Sexual Activity   Alcohol use: Yes    Alcohol/week: 0.0 standard drinks of alcohol    Comment: occ / social   Drug use: No   Sexual activity: Not Currently  Other Topics Concern   Not on file  Social History Narrative   Originally from [place]   Retired from school system- Diplomatic Services operational officer for [employer]   Completed 2 year community college   Has one daughter- [name]   2 grandchildren   Enjoys volunteering, GYM, crafts, reading   Social Determinants of Health   Financial Resource Strain: Low Risk  (09/11/2022)   Overall Financial Resource Strain (CARDIA)    Difficulty of Paying Living Expenses: Not hard at all   Food Insecurity: No Food Insecurity (09/11/2022)   Hunger Vital Sign    Worried About Running Out of Food in the Last Year: Never true    Ran Out of Food in the Last Year: Never true  Transportation Needs: No Transportation Needs (09/11/2022)   PRAPARE - Administrator, Civil Service (Medical): No    Lack of Transportation (Non-Medical): No  Physical Activity: Sufficiently Active (09/11/2022)   Exercise Vital Sign    Days of Exercise per Week: 7 days    Minutes of Exercise per Session: 30 min  Stress: No Stress Concern Present (09/11/2022)   [employer] of Occupational Health - Occupational Stress Questionnaire    Feeling of Stress : Not at all  Social Connections: Moderately Integrated (09/11/2022)   Social Connection and Isolation Panel [NHANES]    Frequency of Communication with Friends and Family: Three times a week    Frequency of Social Gatherings with Friends and Family: More than three times a week    Attends Religious Services: More than 4 times per year    Active Member of Clubs or Organizations: Yes    Attends Banker Meetings: More than 4 times per year    Marital Status: Widowed    Tobacco Counseling Counseling given: Not Answered   Clinical Intake:  Pre-visit preparation completed: Yes  Pain : No/denies pain  BMI - recorded: 23.49 Nutritional Status: BMI of 19-24  Normal Nutritional Risks: None Diabetes: No  How often do you need to have someone help you when you read instructions, pamphlets, or other written materials from your doctor or pharmacy?: 1 - Never  Interpreter Needed?: No  Information entered by :: Donne Anon, CMA   Activities of Daily Living    09/11/2022    9:36 AM  In your present state of health, do you have any difficulty performing the following activities:  Hearing? 1  Vision? 0  Difficulty concentrating or making decisions? 0  Walking or climbing stairs? 0  Dressing or bathing? 0  Doing errands,  shopping? 0  Preparing Food and eating ? N  Using the Toilet? N  In the past six months, have you accidently leaked urine? N  Do you have problems with loss of bowel control? N  Managing your Medications? N  Managing your Finances? N  Housekeeping or managing your Housekeeping? N    Patient Care Team: Sandford Craze, NP as PCP - General (Internal Medicine) Huel Cote, MD as Consulting  Physician (Obstetrics and Gynecology) Frederico Hamman, MD as Consulting Physician (Orthopedic Surgery)  Indicate any recent Medical Services you may have received from other than [facility] providers in the past year (date may be approximate).     Assessment:   This is a routine wellness examination for [place].  Hearing/Vision screen No results found.  Dietary issues and exercise activities discussed:     Goals Addressed   None    Depression Screen    09/11/2022    9:48 AM 08/30/2021    3:44 PM 08/19/2020    8:17 AM 08/13/2019    8:14 AM 07/25/2019    9:35 AM 04/23/2017   10:08 AM 04/03/2016    9:31 AM  PHQ 2/9 Scores  PHQ - 2 Score 0 0 0 0 0 0 0  PHQ- 9 Score     4 1     Fall Risk    09/11/2022    9:38 AM 08/30/2021    3:43 PM 08/19/2020    8:17 AM 08/13/2019    8:14 AM 07/25/2019    8:30 AM  Fall Risk   Falls in the past year? 0 0 0 0 0  Number falls in past yr: 0 0 0 0 0  Injury with Fall? 0 0 0 0 0  Risk for fall due to : No Fall Risks No Fall Risks     Follow up Falls evaluation completed Falls evaluation completed Falls prevention discussed Education provided;Falls prevention discussed     MEDICARE RISK AT HOME: Medicare Risk at Home Any stairs in or around the home?: No If so, are there any without handrails?: No Home free of loose throw rugs in walkways, pet beds, electrical cords, etc?: Yes Adequate lighting in your home to reduce risk of falls?: Yes Life alert?: No Use of a cane, walker or w/c?: No Grab bars in the bathroom?: No Shower chair or bench in shower?:  No Elevated toilet seat or a handicapped toilet?: No  TIMED UP AND GO:  Was the test performed?  Yes  Length of time to ambulate 10 feet: 5 sec Gait steady and fast without use of assistive device    Cognitive Function:        09/11/2022    9:49 AM 08/30/2021    3:50 PM  6CIT Screen  What Year? 0 points 0 points  What month? 0 points 0 points  What time? 0 points 0 points  Count back from 20 0 points 0 points  Months in reverse 0 points 0 points  Repeat phrase 0 points 2 points  Total Score 0 points 2 points    Immunizations Immunization History  Administered Date(s) Administered   Fluad Quad(high Dose 65+) 10/09/2018, 10/07/2019, 10/31/2020, 10/05/2021   Influenza Split 11/09/2010, 11/10/2011, 11/05/2012   Influenza, High Dose Seasonal PF 10/30/2016   Influenza,inj,Quad PF,6+ Mos 10/16/2017   Influenza-Unspecified 11/06/2013, 11/26/2014, 10/27/2015   PFIZER(Purple Top)SARS-COV-2 Vaccination 02/12/2019, 03/05/2019, 11/27/2019   Pneumococcal Conjugate-13 07/09/2018   Rabies Immune Globulin 11/12/2019, 11/15/2019, 11/19/2019, 11/26/2019   Tdap 09/24/2010, 11/12/2019   Zoster Recombinant(Shingrix) 10/16/2017, 12/24/2017   Zoster, Live 04/27/2014    TDAP status: Up to date  Flu Vaccine status: Due, Education has been provided regarding the importance of this vaccine. Advised may receive this vaccine at local pharmacy or Health Dept. Aware to provide a copy of the vaccination record if obtained from local pharmacy or Health Dept. Verbalized acceptance and understanding.  Pneumococcal vaccine status: Due, Education has been provided regarding the  importance of this vaccine. Advised may receive this vaccine at local pharmacy or Health Dept. Aware to provide a copy of the vaccination record if obtained from local pharmacy or Health Dept. Verbalized acceptance and understanding.  Covid-19 vaccine status: Information provided on how to obtain vaccines.   Qualifies for Shingles  Vaccine? Yes   Zostavax completed Yes   Shingrix Completed?: Yes  Screening Tests Health Maintenance  Topic Date Due   Hepatitis C Screening  Never done   Pneumonia Vaccine 28+ Years old (2 of 2 - PPSV23 or PCV20) 07/09/2019   COVID-19 Vaccine (4 - 2023-24 season) 09/23/2021   Medicare Annual Wellness (AWV)  08/31/2022   INFLUENZA VACCINE  08/24/2022   MAMMOGRAM  08/09/2023   DTaP/Tdap/Td (3 - Td or Tdap) 11/11/2029   Colonoscopy  04/04/2032   DEXA SCAN  Completed   Zoster Vaccines- Shingrix  Completed   HPV VACCINES  Aged Out    Health Maintenance  Health Maintenance Due  Topic Date Due   Hepatitis C Screening  Never done   Pneumonia Vaccine 89+ Years old (2 of 2 - PPSV23 or PCV20) 07/09/2019   COVID-19 Vaccine (4 - 2023-24 season) 09/23/2021   Medicare Annual Wellness (AWV)  08/31/2022   INFLUENZA VACCINE  08/24/2022    Colorectal cancer screening: Type of screening: Colonoscopy. Completed 04/05/22. Repeat every 10 years  Mammogram status: Completed 08/09/22. Repeat every year  Bone Density status: Completed 09/30/20. Results reflect: Bone density results: OSTEOPENIA. Repeat every 2 years.  Lung Cancer Screening: (Low Dose CT Chest recommended if Age 76-80 years, 20 pack-year currently smoking OR have quit w/in 15years.) does not qualify.   Additional Screening:  Hepatitis C Screening: does qualify; Completed N/a  Vision Screening: Recommended annual ophthalmology exams for early detection of glaucoma and other disorders of the eye. Is the patient up to date with their annual eye exam?  Yes  Who is the provider or what is the name of the office in which the patient attends annual eye exams? Dr. Renaldo Fiddler If pt is not established with a provider, would they like to be referred to a provider to establish care? No .   Dental Screening: Recommended annual dental exams for proper oral hygiene  Diabetic Foot Exam: N/a  Community Resource Referral / Chronic Care  Management: CRR required this visit?  No   CCM required this visit?  No     Plan:     I have personally reviewed and noted the following in the patient's chart:   Medical and social history Use of alcohol, tobacco or illicit drugs  Current medications and supplements including opioid prescriptions. Patient is not currently taking opioid prescriptions. Functional ability and status Nutritional status Physical activity Advanced directives List of other physicians Hospitalizations, surgeries, and ER visits in previous 12 months Vitals Screenings to include cognitive, depression, and falls Referrals and appointments  In addition, I have reviewed and discussed with patient certain preventive protocols, quality metrics, and best practice recommendations. A written personalized care plan for preventive services as well as general preventive health recommendations were provided to patient.     Donne Anon, CMA   09/11/2022   After Visit Summary: Sent to mychart  Nurse Notes: None

## 2022-09-11 NOTE — Patient Instructions (Signed)
[name] , Thank you for taking time to come for your Medicare Wellness Visit. I appreciate your ongoing commitment to your health goals. Please review the following plan we discussed and let me know if I can assist you in the future.   These are the goals we discussed:  Goals      Maintain healthy active lifestyle        This is a list of the screening recommended for you and due dates:  Health Maintenance  Topic Date Due   Hepatitis C Screening  Never done   Pneumonia Vaccine (2 of 2 - PPSV23 or PCV20) 07/09/2019   COVID-19 Vaccine (4 - 2023-24 season) 09/23/2021   Flu Shot  08/24/2022   Mammogram  08/09/2023   Medicare Annual Wellness Visit  09/11/2023   DTaP/Tdap/Td vaccine (3 - Td or Tdap) 11/11/2029   Colon Cancer Screening  04/04/2032   DEXA scan (bone density measurement)  Completed   Zoster (Shingles) Vaccine  Completed   HPV Vaccine  Aged Out     Next appointment: Follow up in one year for your annual wellness visit.   Preventive Care 43 Years and Older, Female Preventive care refers to lifestyle choices and visits with your health care provider that can promote health and wellness. What does preventive care include? A yearly physical exam. This is also called an annual well check. Dental exams once or twice a year. Routine eye exams. Ask your health care provider how often you should have your eyes checked. Personal lifestyle choices, including: Daily care of your teeth and gums. Regular physical activity. Eating a healthy diet. Avoiding tobacco and drug use. Limiting alcohol use. Practicing safe sex. Taking low-dose aspirin every day. Taking vitamin and mineral supplements as recommended by your health care provider. What happens during an annual well check? The services and screenings done by your health care provider during your annual well check will depend on your age, overall health, lifestyle risk factors, and family history of disease. Counseling   Your health care provider may ask you questions about your: Alcohol use. Tobacco use. Drug use. Emotional well-being. Home and relationship well-being. Sexual activity. Eating habits. History of falls. Memory and ability to understand (cognition). Work and work Astronomer. Reproductive health. Screening  You may have the following tests or measurements: Height, weight, and BMI. Blood pressure. Lipid and cholesterol levels. These may be checked every 5 years, or more frequently if you are over 27 years old. Skin check. Lung cancer screening. You may have this screening every year starting at age 56 if you have a 30-pack-year history of smoking and currently smoke or have quit within the past 15 years. Fecal occult blood test (FOBT) of the stool. You may have this test every year starting at age 89. Flexible sigmoidoscopy or colonoscopy. You may have a sigmoidoscopy every 5 years or a colonoscopy every 10 years starting at age 66. Hepatitis C blood test. Hepatitis B blood test. Sexually transmitted disease (STD) testing. Diabetes screening. This is done by checking your blood sugar (glucose) after you have not eaten for a while (fasting). You may have this done every 1-3 years. Bone density scan. This is done to screen for osteoporosis. You may have this done starting at age 51. Mammogram. This may be done every 1-2 years. Talk to your health care provider about how often you should have regular mammograms. Talk with your health care provider about your test results, treatment options, and if necessary, the need  for more tests. Vaccines  Your health care provider may recommend certain vaccines, such as: Influenza vaccine. This is recommended every year. Tetanus, diphtheria, and acellular pertussis (Tdap, Td) vaccine. You may need a Td booster every 10 years. Zoster vaccine. You may need this after age 45. Pneumococcal 13-valent conjugate (PCV13) vaccine. One dose is recommended  after age 23. Pneumococcal polysaccharide (PPSV23) vaccine. One dose is recommended after age 52. Talk to your health care provider about which screenings and vaccines you need and how often you need them. This information is not intended to replace advice given to you by your health care provider. Make sure you discuss any questions you have with your health care provider. Document Released: 02/05/2015 Document Revised: 09/29/2015 Document Reviewed: 11/10/2014 Elsevier Interactive Patient Education  2017 [employer].  Fall Prevention in the Home Falls can cause injuries. They can happen to people of all ages. There are many things you can do to make your home safe and to help prevent falls. What can I do on the outside of my home? Regularly fix the edges of walkways and driveways and fix any cracks. Remove anything that might make you trip as you walk through a door, such as a raised step or threshold. Trim any bushes or trees on the path to your home. Use bright outdoor lighting. Clear any walking paths of anything that might make someone trip, such as rocks or tools. Regularly check to see if handrails are loose or broken. Make sure that both sides of any steps have handrails. Any raised decks and porches should have guardrails on the edges. Have any leaves, snow, or ice cleared regularly. Use sand or salt on walking paths during winter. Clean up any spills in your garage right away. This includes oil or grease spills. What can I do in the bathroom? Use night lights. Install grab bars by the toilet and in the tub and shower. Do not use towel bars as grab bars. Use non-skid mats or decals in the tub or shower. If you need to sit down in the shower, use a plastic, non-slip stool. Keep the floor dry. Clean up any water that spills on the floor as soon as it happens. Remove soap buildup in the tub or shower regularly. Attach bath mats securely with double-sided non-slip rug tape. Do not  have throw rugs and other things on the floor that can make you trip. What can I do in the bedroom? Use night lights. Make sure that you have a light by your bed that is easy to reach. Do not use any sheets or blankets that are too big for your bed. They should not hang down onto the floor. Have a firm chair that has side arms. You can use this for support while you get dressed. Do not have throw rugs and other things on the floor that can make you trip. What can I do in the kitchen? Clean up any spills right away. Avoid walking on wet floors. Keep items that you use a lot in easy-to-reach places. If you need to reach something above you, use a strong step stool that has a grab bar. Keep electrical cords out of the way. Do not use floor polish or wax that makes floors slippery. If you must use wax, use non-skid floor wax. Do not have throw rugs and other things on the floor that can make you trip. What can I do with my stairs? Do not leave any items on the stairs.  Make sure that there are handrails on both sides of the stairs and use them. Fix handrails that are broken or loose. Make sure that handrails are as long as the stairways. Check any carpeting to make sure that it is firmly attached to the stairs. Fix any carpet that is loose or worn. Avoid having throw rugs at the top or bottom of the stairs. If you do have throw rugs, attach them to the floor with carpet tape. Make sure that you have a light switch at the top of the stairs and the bottom of the stairs. If you do not have them, ask someone to add them for you. What else can I do to help prevent falls? Wear shoes that: Do not have high heels. Have rubber bottoms. Are comfortable and fit you well. Are closed at the toe. Do not wear sandals. If you use a stepladder: Make sure that it is fully opened. Do not climb a closed stepladder. Make sure that both sides of the stepladder are locked into place. Ask someone to hold it for  you, if possible. Clearly mark and make sure that you can see: Any grab bars or handrails. First and last steps. Where the edge of each step is. Use tools that help you move around (mobility aids) if they are needed. These include: Canes. Walkers. Scooters. Crutches. Turn on the lights when you go into a dark area. Replace any light bulbs as soon as they burn out. Set up your furniture so you have a clear path. Avoid moving your furniture around. If any of your floors are uneven, fix them. If there are any pets around you, be aware of where they are. Review your medicines with your doctor. Some medicines can make you feel dizzy. This can increase your chance of falling. Ask your doctor what other things that you can do to help prevent falls. This information is not intended to replace advice given to you by your health care provider. Make sure you discuss any questions you have with your health care provider. Document Released: 11/05/2008 Document Revised: 06/17/2015 Document Reviewed: 02/13/2014 Elsevier Interactive Patient Education  2017 [employer].

## 2022-10-23 DIAGNOSIS — Z23 Encounter for immunization: Secondary | ICD-10-CM | POA: Diagnosis not present

## 2022-11-14 ENCOUNTER — Other Ambulatory Visit: Payer: Self-pay | Admitting: Family

## 2022-11-16 DIAGNOSIS — H5203 Hypermetropia, bilateral: Secondary | ICD-10-CM | POA: Diagnosis not present

## 2022-11-16 DIAGNOSIS — H43393 Other vitreous opacities, bilateral: Secondary | ICD-10-CM | POA: Diagnosis not present

## 2022-11-16 DIAGNOSIS — H524 Presbyopia: Secondary | ICD-10-CM | POA: Diagnosis not present

## 2022-11-16 DIAGNOSIS — H2513 Age-related nuclear cataract, bilateral: Secondary | ICD-10-CM | POA: Diagnosis not present

## 2022-11-27 ENCOUNTER — Ambulatory Visit: Payer: Medicare Other | Admitting: Family

## 2022-11-29 ENCOUNTER — Ambulatory Visit: Payer: Medicare Other | Admitting: Family

## 2022-11-29 VITALS — BP 122/62 | HR 71 | Temp 98.8°F | Resp 16 | Ht 67.0 in | Wt 150.0 lb

## 2022-11-29 DIAGNOSIS — I10 Essential (primary) hypertension: Secondary | ICD-10-CM | POA: Diagnosis not present

## 2022-11-29 DIAGNOSIS — K219 Gastro-esophageal reflux disease without esophagitis: Secondary | ICD-10-CM

## 2022-11-29 DIAGNOSIS — Z23 Encounter for immunization: Secondary | ICD-10-CM | POA: Diagnosis not present

## 2022-11-29 DIAGNOSIS — Z780 Asymptomatic menopausal state: Secondary | ICD-10-CM | POA: Diagnosis not present

## 2022-11-29 DIAGNOSIS — F419 Anxiety disorder, unspecified: Secondary | ICD-10-CM

## 2022-11-29 DIAGNOSIS — E785 Hyperlipidemia, unspecified: Secondary | ICD-10-CM

## 2022-11-29 LAB — COMPREHENSIVE METABOLIC PANEL
ALT: 16 U/L (ref 0–35)
AST: 18 U/L (ref 0–37)
Albumin: 4.7 g/dL (ref 3.5–5.2)
Alkaline Phosphatase: 99 U/L (ref 39–117)
BUN: 14 mg/dL (ref 6–23)
CO2: 31 meq/L (ref 19–32)
Calcium: 9.4 mg/dL (ref 8.4–10.5)
Chloride: 101 meq/L (ref 96–112)
Creatinine, Ser: 0.86 mg/dL (ref 0.40–1.20)
GFR: 68.86 mL/min (ref >60.00)
Glucose, Bld: 88 mg/dL (ref 70–99)
Potassium: 4 meq/L (ref 3.5–5.1)
Sodium: 140 meq/L (ref 135–145)
Total Bilirubin: 2.4 mg/dL — ABNORMAL HIGH (ref 0.2–1.2)
Total Protein: 6.8 g/dL (ref 6.0–8.3)

## 2022-11-29 LAB — LIPID PANEL
Cholesterol: 213 mg/dL — ABNORMAL HIGH (ref 0–200)
HDL: 67.6 mg/dL (ref >39.00)
LDL Cholesterol: 116 mg/dL — ABNORMAL HIGH (ref 0–99)
NonHDL: 145.17
Total CHOL/HDL Ratio: 3
Triglycerides: 148 mg/dL (ref 0.0–149.0)
VLDL: 29.6 mg/dL (ref 0.0–40.0)

## 2022-11-29 NOTE — Patient Instructions (Signed)
VISIT SUMMARY:  You came in today for a routine check-up. We discussed your history of high cholesterol, anxiety, and acid reflux. Your blood pressure is well controlled, and we reviewed your current medications. We also talked about your family history of leukemia and your concerns about your health.  YOUR PLAN:  -HYPERTENSION: Hypertension, or high blood pressure, is when the force of the blood against your artery walls is too high. Your blood pressure is well controlled with your current medication, so please continue taking it as prescribed.  -GASTROESOPHAGEAL REFLUX DISEASE (GERD): GERD is a condition where stomach acid frequently flows back into the tube connecting your mouth and stomach. You are managing it with omeprazole and Pepcid. Continue taking these medications as you have been.  -ANXIETY: Anxiety is a feeling of worry or fear that can be strong enough to interfere with daily activities. Your anxiety is stable with Lexapro, so please continue taking it as prescribed.  -GENERAL HEALTH MAINTENANCE: We will administer the Pneumovax 23 vaccine today as a booster. We will also order a bone density scan to check on your bone health and a metabolic panel and cholesterol level to monitor your overall health. Please return for a follow-up visit in 6 months.  INSTRUCTIONS:  Please return for a follow-up visit in 6 months. We will also be conducting a bone density scan and ordering a metabolic panel and cholesterol level to monitor your health.

## 2022-11-29 NOTE — Assessment & Plan Note (Signed)
  Patient reports intermittent use of omeprazole and consistent use of Pepcid. -Continue current regimen as tolerated by the patient.

## 2022-11-29 NOTE — Assessment & Plan Note (Signed)
  Stable on Lexapro. -Continue Lexapro as currently prescribed.

## 2022-11-29 NOTE — Progress Notes (Signed)
Subjective:     Patient ID: Megan Middleton, female    DOB: 24-Jun-1955, 67 y.o.   MRN: [id number]  Chief Complaint  Patient presents with   Annual Exam    HPI  Discussed the use of AI scribe software for clinical note transcription with the patient, who gave verbal consent to proceed.  History of Present Illness   The patient, with a history of high cholesterol, anxiety, and acid reflux, presents for a routine follow up. The patient reports that her cholesterol has been a little high for years. She is currently taking Lexapro for anxiety and omeprazole for acid reflux. She sometimes stops taking omeprazole but resumes when she experiences acid reflux symptoms.          Health Maintenance Due  Topic Date Due   Pneumonia Vaccine 46+ Years old (2 of 2 - PPSV23 or PCV20) 07/09/2019   COVID-19 Vaccine (4 - 2023-24 season) 09/24/2022    Past Medical History:  Diagnosis Date   Allergy    seasonal   Basal cell carcinoma 2023   left temple, sees dermatolog   Gilberts syndrome    Gynecological examination    sees Dr. Huel Cote    Hyperlipidemia    Hypertension    Osteopenia 10/02/2020   Squamous cell carcinoma    Sees Derm Royden Purl   Tubular adenoma of colon 2015    Past Surgical History:  Procedure Laterality Date   collar bone surgery     jan,2020   COLONOSCOPY  10/08/2013   per Dr. Russella Dar, adenomatous polyps, repear in 5 yrs   FRACTURE SURGERY  January 2020   Clavicle left   TUBAL LIGATION      Family History  Problem Relation Age of Onset   Hypertension Father    Breast cancer Sister    Lymphoma Mother    Leukemia Mother    Cancer Mother    Colon cancer Neg Hx    Esophageal cancer Neg Hx    Rectal cancer Neg Hx    Stomach cancer Neg Hx    Colon polyps Neg Hx     Social History   Socioeconomic History   Marital status: Widowed    Spouse name: Not on file   Number of children: Not on file   Years of education: Not on file   Highest  education level: Associate degree: academic program  Occupational History   Not on file  Tobacco Use   Smoking status: Never   Smokeless tobacco: Never  Vaping Use   Vaping status: Not on file  Substance and Sexual Activity   Alcohol use: Yes    Alcohol/week: 0.0 standard drinks of alcohol    Comment: occ / social   Drug use: No   Sexual activity: Not Currently  Other Topics Concern   Not on file  Social History Narrative   Originally from [place]   Retired from school system- Diplomatic Services operational officer for [employer]   Completed 2 year community college   Has one daughter- Megan Middleton   2 grandchildren   Enjoys volunteering, GYM, crafts, reading   Social Determinants of Health   Financial Resource Strain: Low Risk  (11/28/2022)   Overall Financial Resource Strain (CARDIA)    Difficulty of Paying Living Expenses: Not hard at all  Food Insecurity: No Food Insecurity (11/28/2022)   Hunger Vital Sign    Worried About Running Out of Food in the Last Year: Never true    Ran Out of Food  in the Last Year: Never true  Transportation Needs: No Transportation Needs (11/28/2022)   PRAPARE - Administrator, Civil Service (Medical): No    Lack of Transportation (Non-Medical): No  Physical Activity: Insufficiently Active (11/28/2022)   Exercise Vital Sign    Days of Exercise per Week: 3 days    Minutes of Exercise per Session: 30 min  Stress: No Stress Concern Present (11/28/2022)   [employer] of Occupational Health - Occupational Stress Questionnaire    Feeling of Stress : Only a little  Social Connections: Moderately Integrated (11/28/2022)   Social Connection and Isolation Panel [NHANES]    Frequency of Communication with Friends and Family: More than three times a week    Frequency of Social Gatherings with Friends and Family: Three times a week    Attends Religious Services: 1 to 4 times per year    Active Member of Clubs or Organizations: Yes    Attends Banker  Meetings: 1 to 4 times per year    Marital Status: Widowed  Intimate Partner Violence: Not At Risk (09/11/2022)   Humiliation, Afraid, Rape, and Kick questionnaire    Fear of Current or Ex-Partner: No    Emotionally Abused: No    Physically Abused: No    Sexually Abused: No    Outpatient Medications Prior to Visit  Medication Sig Dispense Refill   amLODipine (NORVASC) 5 MG tablet TAKE 1 TABLET (5 MG TOTAL) BY MOUTH DAILY. 90 tablet 1   calcium carbonate (OS-CAL) 600 MG TABS Take 600 mg by mouth daily. With vitamin d     escitalopram (LEXAPRO) 20 MG tablet TAKE 1 TABLET BY MOUTH EVERY DAY 90 tablet 1   famotidine (PEPCID) 20 MG tablet Take 20 mg by mouth at bedtime.     NON FORMULARY JUICE PLUS     omeprazole (PRILOSEC) 20 MG capsule TAKE 1 CAPSULE BY MOUTH EVERY DAY 90 capsule 1   No facility-administered medications prior to visit.    Allergies  Allergen Reactions   Doxycycline     GI side effects   Morphine Other (See Comments) and Nausea Only    Low blood pressure    ROS See HPI    Objective:    Physical Exam Constitutional:      General: She is not in acute distress.    Appearance: Normal appearance. She is well-developed.  HENT:     Head: Normocephalic and atraumatic.     Right Ear: External ear normal.     Left Ear: External ear normal.  Eyes:     General: No scleral icterus. Neck:     Thyroid: No thyromegaly.  Cardiovascular:     Rate and Rhythm: Normal rate and regular rhythm.     Heart sounds: Normal heart sounds. No murmur heard. Pulmonary:     Effort: Pulmonary effort is normal. No respiratory distress.     Breath sounds: Normal breath sounds. No wheezing.  Musculoskeletal:     Cervical back: Neck supple.  Skin:    General: Skin is warm and dry.  Neurological:     Mental Status: She is alert and oriented to person, place, and time.  Psychiatric:        Mood and Affect: Mood normal.        Behavior: Behavior normal.        Thought Content:  Thought content normal.        Judgment: Judgment normal.      BP 122/62 (BP  Location: Right Arm, Patient Position: Sitting, Cuff Size: Small)   Pulse 71   Temp 98.8 F (37.1 C) (Oral)   Resp 16   Ht 5\' 7"  (1.702 m)   Wt 150 lb (68 kg)   SpO2 96%   BMI 23.49 kg/m  Wt Readings from Last 3 Encounters:  11/29/22 150 lb (68 kg)  09/11/22 150 lb (68 kg)  04/05/22 151 lb (68.5 kg)       Assessment & Plan:   Problem List Items Addressed This Visit       Unprioritized   Hyperlipidemia   Relevant Orders   Comp Met (CMET)   Lipid panel   HTN (hypertension)     Well controlled on current medication regimen. Blood pressure at today's visit was 122/62. -Continue current antihypertensive medication.      GERD (gastroesophageal reflux disease)     Patient reports intermittent use of omeprazole and consistent use of Pepcid. -Continue current regimen as tolerated by the patient.      Anxiety     Stable on Lexapro. -Continue Lexapro as currently prescribed.      Other Visit Diagnoses     Postmenopausal estrogen deficiency    -  Primary   Relevant Orders   DG Bone Density   Need for 23-valent pneumococcal polysaccharide vaccine       Relevant Orders   Pneumococcal polysaccharide vaccine 23-valent greater than or equal to 2yo subcutaneous/IM      General Health Maintenance / Followup Plans -Pneumovax 23 vaccine to be administered today as a booster following Prevnar 13 given in 2020. -Order bone density scan to follow up on mild bone thinning detected in 2022. -Order metabolic panel and cholesterol level. -Return visit in 6 months.  I am having Babs Bertin maintain her calcium carbonate, NON FORMULARY, famotidine, omeprazole, amLODipine, and escitalopram.  No orders of the defined types were placed in this encounter.

## 2022-11-29 NOTE — Assessment & Plan Note (Signed)
  Well controlled on current medication regimen. Blood pressure at today's visit was 122/62. -Continue current antihypertensive medication.

## 2022-12-04 DIAGNOSIS — J40 Bronchitis, not specified as acute or chronic: Secondary | ICD-10-CM | POA: Diagnosis not present

## 2022-12-04 DIAGNOSIS — J029 Acute pharyngitis, unspecified: Secondary | ICD-10-CM | POA: Diagnosis not present

## 2022-12-14 ENCOUNTER — Ambulatory Visit (HOSPITAL_BASED_OUTPATIENT_CLINIC_OR_DEPARTMENT_OTHER)
Admission: RE | Admit: 2022-12-14 | Discharge: 2022-12-14 | Disposition: A | Discharge status: Home / Self Care | Payer: Medicare Other | Source: Ambulatory Visit | Attending: Family | Admitting: Family

## 2022-12-14 DIAGNOSIS — Z780 Asymptomatic menopausal state: Secondary | ICD-10-CM | POA: Diagnosis not present

## 2022-12-14 DIAGNOSIS — M8588 Other specified disorders of bone density and structure, other site: Secondary | ICD-10-CM | POA: Diagnosis not present

## 2023-01-10 ENCOUNTER — Other Ambulatory Visit: Payer: Self-pay | Admitting: Family

## 2023-02-21 ENCOUNTER — Other Ambulatory Visit: Payer: Self-pay | Admitting: Family

## 2023-03-06 DIAGNOSIS — L814 Other melanin hyperpigmentation: Secondary | ICD-10-CM | POA: Diagnosis not present

## 2023-03-06 DIAGNOSIS — D1801 Hemangioma of skin and subcutaneous tissue: Secondary | ICD-10-CM | POA: Diagnosis not present

## 2023-03-06 DIAGNOSIS — C44722 Squamous cell carcinoma of skin of right lower limb, including hip: Secondary | ICD-10-CM | POA: Diagnosis not present

## 2023-03-06 DIAGNOSIS — Z85828 Personal history of other malignant neoplasm of skin: Secondary | ICD-10-CM | POA: Diagnosis not present

## 2023-03-06 DIAGNOSIS — D485 Neoplasm of uncertain behavior of skin: Secondary | ICD-10-CM | POA: Diagnosis not present

## 2023-03-06 DIAGNOSIS — L570 Actinic keratosis: Secondary | ICD-10-CM | POA: Diagnosis not present

## 2023-03-06 DIAGNOSIS — L821 Other seborrheic keratosis: Secondary | ICD-10-CM | POA: Diagnosis not present

## 2023-04-17 DIAGNOSIS — C44722 Squamous cell carcinoma of skin of right lower limb, including hip: Secondary | ICD-10-CM | POA: Diagnosis not present

## 2023-04-23 DIAGNOSIS — B999 Unspecified infectious disease: Secondary | ICD-10-CM | POA: Diagnosis not present

## 2023-04-26 DIAGNOSIS — L089 Local infection of the skin and subcutaneous tissue, unspecified: Secondary | ICD-10-CM | POA: Diagnosis not present

## 2023-05-01 DIAGNOSIS — L905 Scar conditions and fibrosis of skin: Secondary | ICD-10-CM | POA: Diagnosis not present

## 2023-05-14 ENCOUNTER — Encounter: Payer: Self-pay | Admitting: Family

## 2023-05-14 ENCOUNTER — Ambulatory Visit: Payer: Self-pay | Admitting: *Deleted

## 2023-05-14 NOTE — Telephone Encounter (Signed)
  Chief Complaint: R ankle swelling- redness in calve Symptoms: swelling, redness- post staph treatment for wound infection Frequency: ongoing symptoms- 04/17/23 Pertinent Negatives: Patient denies fever Disposition: [] ED /[] Urgent Care (no appt availability in office) / [] Appointment(In office/virtual)/ []  [facility]/ [] Home Care/ [] Refused Recommended Disposition /[] [place] [facility]/ [x]  Follow-up with PCP Additional Notes: Patient needs appointment- no appointment within disposition- call sent to office for review and scheduling    Copied from CRM [id number]. Topic: Clinical - Red Word Triage >> May 14, 2023 10:41 AM [name] wrote: [facility] that prompted transfer to Nurse Triage: Skin cancer removed .[name] Got a staph  infection.. Patient adv the site is very swollen and warm - above ankle bone.. Reason for Disposition  [1] Redness AND [2] painful when touched AND [3] no fever  Answer Assessment - Initial Assessment Questions 1. LOCATION: "Which ankle is swollen?" "Where is the swelling?"     R ankle swelling into the lower leg 2. ONSET: "When did the swelling start?"     3/25- ongoing problem since skin biopsy  3. SWELLING: "How bad is the swelling?" Or, "How large is it?" (e.g., mild, moderate, severe; size of localized swelling)    - NONE: No joint swelling.   - LOCALIZED: Localized; small area of puffy or swollen skin (e.g., insect bite, skin irritation).   - MILD: Joint looks or feels mildly swollen or puffy.   - MODERATE: Swollen; interferes with normal activities (e.g., work or school); decreased range of movement; may be limping.   - SEVERE: Very swollen; can't move swollen joint at all; limping a lot or unable to walk.     localized 4. PAIN: "Is there any pain?" If Yes, ask: "How bad is it?" (Scale 1-10; or mild, moderate, severe)   - NONE (0): no pain.   - MILD (1-3): doesn't interfere with normal activities.    - MODERATE (4-7): interferes with normal activities  (e.g., work or school) or awakens from sleep, limping.    - SEVERE (8-10): excruciating pain, unable to do any normal activities, unable to walk.      none 5. CAUSE: "What do you think caused the ankle swelling?"     Residual infection- redness in back of leg 6. OTHER SYMPTOMS: "Do you have any other symptoms?" (e.g., fever, chest pain, difficulty breathing, calf pain)     Warn to touch- ankle  Protocols used: Ankle Swelling-A-AH

## 2023-05-15 ENCOUNTER — Encounter: Payer: Self-pay | Admitting: Family

## 2023-05-15 ENCOUNTER — Ambulatory Visit (INDEPENDENT_AMBULATORY_CARE_PROVIDER_SITE_OTHER): Admitting: Family

## 2023-05-15 VITALS — BP 122/70 | HR 84 | Temp 97.8°F | Ht 67.0 in | Wt 155.8 lb

## 2023-05-15 DIAGNOSIS — R600 Localized edema: Secondary | ICD-10-CM | POA: Diagnosis not present

## 2023-05-15 DIAGNOSIS — L03115 Cellulitis of right lower limb: Secondary | ICD-10-CM | POA: Diagnosis not present

## 2023-05-15 LAB — CBC WITH DIFFERENTIAL/PLATELET
Basophils Absolute: 0.1 10*3/uL (ref 0.0–0.1)
Basophils Relative: 1.2 % (ref 0.0–3.0)
Eosinophils Absolute: 0.2 10*3/uL (ref 0.0–0.7)
Eosinophils Relative: 2.6 % (ref 0.0–5.0)
HCT: 41.6 % (ref 36.0–46.0)
Hemoglobin: 14.2 g/dL (ref 12.0–15.0)
Lymphocytes Relative: 27.7 % (ref 12.0–46.0)
Lymphs Abs: 1.8 10*3/uL (ref 0.7–4.0)
MCHC: 34.1 g/dL (ref 30.0–36.0)
MCV: 95 fl (ref 78.0–100.0)
Monocytes Absolute: 0.4 10*3/uL (ref 0.1–1.0)
Monocytes Relative: 5.9 % (ref 3.0–12.0)
Neutro Abs: 4.2 10*3/uL (ref 1.4–7.7)
Neutrophils Relative %: 62.6 % (ref 43.0–77.0)
Platelets: 273 10*3/uL (ref 150.0–400.0)
RBC: 4.39 Mil/uL (ref 3.87–5.11)
RDW: 13.5 % (ref 11.5–15.5)
WBC: 6.6 10*3/uL (ref 4.0–10.5)

## 2023-05-15 NOTE — Progress Notes (Signed)
 Megan Middleton is a 68 y.o. female with the following history as recorded in EpicCare:  Patient Active Problem List   Diagnosis Date Noted   OSA (obstructive sleep apnea) 05/10/2021   Trigger ring finger of right hand 12/24/2020   Osteopenia 10/02/2020   GERD (gastroesophageal reflux disease) 05/31/2020   Anxiety 05/31/2020   Acute maxillary sinusitis 05/31/2020   Preventative health care 03/22/2015   Hyperlipidemia 12/05/2012   HTN (hypertension) 12/05/2012    Current Outpatient Medications  Medication Sig Dispense Refill   amLODipine  (NORVASC ) 5 MG tablet Take 1 tablet (5 mg total) by mouth daily. 90 tablet 1   calcium carbonate (OS-CAL) 600 MG TABS Take 600 mg by mouth daily. With vitamin d      escitalopram  (LEXAPRO ) 20 MG tablet TAKE 1 TABLET BY MOUTH EVERY DAY 90 tablet 1   famotidine  (PEPCID ) 20 MG tablet Take 20 mg by mouth at bedtime.     NON FORMULARY JUICE PLUS     omeprazole  (PRILOSEC) 20 MG capsule TAKE 1 CAPSULE BY MOUTH EVERY DAY 90 capsule 1   No current facility-administered medications for this visit.    Allergies: Doxycycline  and Morphine  Past Medical History:  Diagnosis Date   Allergy    seasonal   Basal cell carcinoma 2023   left temple, sees dermatolog   Gilberts syndrome    Gynecological examination    sees Dr. Rogene Claude    Hyperlipidemia    Hypertension    Osteopenia 10/02/2020   Squamous cell carcinoma    Sees Derm Alethia Andrea   Tubular adenoma of colon 2015    Past Surgical History:  Procedure Laterality Date   collar bone surgery     jan,2020   COLONOSCOPY  10/08/2013   per Dr. Sandrea Cruel, adenomatous polyps, repear in 5 yrs   FRACTURE SURGERY  January 2020   Clavicle left   TUBAL LIGATION      Family History  Problem Relation Age of Onset   Hypertension Father    Breast cancer Sister    Lymphoma Mother    Leukemia Mother    Cancer Mother    Colon cancer Neg Hx    Esophageal cancer Neg Hx    Rectal cancer Neg Hx     Stomach cancer Neg Hx    Colon polyps Neg Hx     Social History   Tobacco Use   Smoking status: Never   Smokeless tobacco: Never  Substance Use Topics   Alcohol use: Yes    Alcohol/week: 0.0 standard drinks of alcohol    Comment: occ / social    Subjective:   Had Mohs surgery on lower right leg at the end of March; developed a staph infection and was prescribed course of Bactrim- some improvement with initial treatment; was prescribed a 2nd course of antibiotics but unfortunately she lost that prescription while she was on vacation and was unable to complete the prescription; has not been on any antibiotics since April 10; patient is concerned about worsening swelling in her right lower extremity and is concerned she has a possible blood clot;  Of note, her dermatologist has called in another prescription for Bactrim for her to start today- she needs to go to her pharmacy to discuss cost options for the medication and does plan to leave here to go to her pharmacy;    Objective:  Vitals:   05/15/23 1417  BP: 122/70  Pulse: 84  Temp: 97.8 F (36.6 C)  TempSrc: Oral  SpO2: 97%  Weight: 155 lb 12.8 oz (70.7 kg)  Height: 5\' 7"  (1.702 m)    General: Well developed, well nourished, in no acute distress  Skin : Warm and dry. Scabbed lesion noted on medial side of right lower extremity Head: Normocephalic and atraumatic  Lungs: Respirations unlabored;  Extremities: mild edema, no cyanosis, no clubbing  Vessels: Symmetric bilaterally  Neurologic: Alert and oriented; speech intact; face symmetrical; moves all extremities well; CNII-XII intact without focal deficit   Assessment:  1. Pedal edema   2. Cellulitis of right lower extremity     Plan:  Check CBC today; repeat wound culture; she has prescription for Bactrim from her dermatologist and plans to start this today; Will update vascular ultrasound due to the swelling noted in lower extremity and recent travel but suspect related  to infection;  Follow up to be determined- she does have a 6 month follow up with her PCP in early May which can be used to re-check the wound as well.   No follow-ups on file.  Orders Placed This Encounter  Procedures   WOUND CULTURE    Source:   right leg   CBC with Differential/Platelet    Requested Prescriptions    No prescriptions requested or ordered in this encounter

## 2023-05-15 NOTE — Patient Instructions (Signed)
 Please take the antibiotics as prescribed by your surgeon. We are going to check your WBC and the culture as discussed today. We will also order the ultrasound and they should contact you to schedule.

## 2023-05-16 ENCOUNTER — Ambulatory Visit (HOSPITAL_COMMUNITY)
Admission: RE | Admit: 2023-05-16 | Discharge: 2023-05-16 | Disposition: A | Discharge status: Home / Self Care | Source: Ambulatory Visit | Attending: Family | Admitting: Family

## 2023-05-16 DIAGNOSIS — R600 Localized edema: Secondary | ICD-10-CM | POA: Insufficient documentation

## 2023-05-17 ENCOUNTER — Telehealth: Payer: Self-pay

## 2023-05-17 NOTE — Telephone Encounter (Signed)
 Please advise if new evaluation/ follow up needed  Copied from CRM [id number]. Topic: Clinical - Medical Advice >> May 17, 2023 11:35 AM Elita Guitar wrote: Reason for CRM: Patient called back to let Camilo Cella know that she did start taking the antibiotics. However, her ankle is still swollen, not red or painful, but still swollen. She [facility] clots [facility] and the results were good.  Please advise.

## 2023-05-18 NOTE — Telephone Encounter (Signed)
Spoke with pt, pt is aware of results and expressed understanding.  

## 2023-05-19 LAB — WOUND CULTURE
MICRO NUMBER:: 16359456
RESULT:: NO GROWTH
SPECIMEN QUALITY:: ADEQUATE

## 2023-05-20 ENCOUNTER — Other Ambulatory Visit: Payer: Self-pay | Admitting: Family

## 2023-05-21 ENCOUNTER — Encounter: Payer: Self-pay | Admitting: Family

## 2023-05-30 ENCOUNTER — Ambulatory Visit: Payer: Medicare Other | Admitting: Family

## 2023-05-30 VITALS — BP 108/60 | HR 76 | Temp 98.4°F | Resp 16 | Ht 67.0 in | Wt 152.0 lb

## 2023-05-30 DIAGNOSIS — T148XXA Other injury of unspecified body region, initial encounter: Secondary | ICD-10-CM

## 2023-05-30 DIAGNOSIS — F419 Anxiety disorder, unspecified: Secondary | ICD-10-CM | POA: Diagnosis not present

## 2023-05-30 DIAGNOSIS — K219 Gastro-esophageal reflux disease without esophagitis: Secondary | ICD-10-CM | POA: Diagnosis not present

## 2023-05-30 DIAGNOSIS — G4733 Obstructive sleep apnea (adult) (pediatric): Secondary | ICD-10-CM

## 2023-05-30 DIAGNOSIS — E785 Hyperlipidemia, unspecified: Secondary | ICD-10-CM

## 2023-05-30 DIAGNOSIS — M8580 Other specified disorders of bone density and structure, unspecified site: Secondary | ICD-10-CM | POA: Diagnosis not present

## 2023-05-30 MED ORDER — FAMOTIDINE 20 MG PO TABS: 20.0000 mg | ORAL_TABLET | Freq: Two times a day (BID) | ORAL | 1 refills | Status: AC

## 2023-05-30 NOTE — Assessment & Plan Note (Signed)
Stable on lexapro- continue same.  

## 2023-05-30 NOTE — Assessment & Plan Note (Signed)
 Stable on CPAP, seeing Tammy Parrett once a year for follow up.

## 2023-05-30 NOTE — Assessment & Plan Note (Signed)
 Still having some symptoms with pepcid HS and prn omeprazole  20mg .  She is hesitant to continue omeprazole  due to potential side effects.  Will d/c omeprazole  and increase pepcid to bid.

## 2023-05-30 NOTE — Assessment & Plan Note (Signed)
 BP Readings from Last 3 Encounters:  05/30/23 108/60  05/15/23 122/70  11/29/22 122/62   p

## 2023-05-30 NOTE — Assessment & Plan Note (Signed)
 Continue calcium and weight bearing exercise. Dexa up to date.

## 2023-05-30 NOTE — Progress Notes (Signed)
 Subjective:     Patient ID: Megan Middleton, female    DOB: 1955-01-28, 68 y.o.   MRN: [id number]  Chief Complaint  Patient presents with   Hypertension    Here for follow up   Anxiety    Here for follow up    Hypertension Associated symptoms include anxiety.  Anxiety      Discussed the use of AI scribe software for clinical note transcription with the patient, who gave verbal consent to proceed.  History of Present Illness  Megan Middleton is a 68 year old female who presents with a leg infection post-surgery.  Post-surgery, she developed significant swelling and pain in her leg. Initially, antibiotics were not prescribed despite the wound's concerning appearance. She adhered to post-operative care instructions, including using a vinegar solution and antibacterial soap. A few days later, the leg became increasingly painful and swollen, impairing her ability to walk. A dermatologist suspected an infection and prescribed a 7-day course of antibiotics, with a culture confirming a staph infection. Despite treatment, swelling and oozing persisted. She elevated the leg and applied ice. CBC was normal and US  was neg for DVT.  She was placed on another course of antibiotics.   Currently, the leg exhibits some swelling and oozing, though less severe. The wound is healing with granulation tissue present. Swelling remains more significant than in her other leg. She manages gastroesophageal reflux disease with intermittent omeprazole  20 mg and experiences persistent throat discomfort. Her mood is stable on Lexapro  without significant anxiety or notable side effects.   Health Maintenance Due  Topic Date Due   COVID-19 Vaccine (4 - 2024-25 season) 09/24/2022    Past Medical History:  Diagnosis Date   Allergy    seasonal   Basal cell carcinoma 2023   left temple, sees dermatolog   Gilberts syndrome    Gynecological examination    sees Dr. Rogene Claude    Hyperlipidemia     Hypertension    Osteopenia 10/02/2020   Squamous cell carcinoma    Sees Derm Alethia Andrea   Tubular adenoma of colon 2015    Past Surgical History:  Procedure Laterality Date   collar bone surgery     jan,2020   COLONOSCOPY  10/08/2013   per Dr. Sandrea Cruel, adenomatous polyps, repear in 5 yrs   FRACTURE SURGERY  January 2020   Clavicle left   TUBAL LIGATION      Family History  Problem Relation Age of Onset   Hypertension Father    Breast cancer Sister    Lymphoma Mother    Leukemia Mother    Cancer Mother    Colon cancer Neg Hx    Esophageal cancer Neg Hx    Rectal cancer Neg Hx    Stomach cancer Neg Hx    Colon polyps Neg Hx     Social History   Socioeconomic History   Marital status: Widowed    Spouse name: Not on file   Number of children: Not on file   Years of education: Not on file   Highest education level: Associate degree: academic program  Occupational History   Not on file  Tobacco Use   Smoking status: Never   Smokeless tobacco: Never  Vaping Use   Vaping status: Not on file  Substance and Sexual Activity   Alcohol use: Yes    Alcohol/week: 0.0 standard drinks of alcohol    Comment: occ / social   Drug use: No   Sexual activity: Not  Currently  Other Topics Concern   Not on file  Social History Narrative   Originally from [place]   Retired from school system- Diplomatic Services operational officer for [employer]   Completed 2 year community college   Has one daughter- Megan Middleton   2 grandchildren   Enjoys volunteering, GYM, crafts, reading   Social Drivers of Health   Financial Resource Strain: Low Risk  (05/29/2023)   Overall Financial Resource Strain (CARDIA)    Difficulty of Paying Living Expenses: Not hard at all  Food Insecurity: No Food Insecurity (05/29/2023)   Hunger Vital Sign    Worried About Running Out of Food in the Last Year: Never true    Ran Out of Food in the Last Year: Never true  Transportation Needs: No Transportation Needs (05/29/2023)   PRAPARE -  Administrator, Civil Service (Medical): No    Lack of Transportation (Non-Medical): No  Physical Activity: Sufficiently Active (05/29/2023)   Exercise Vital Sign    Days of Exercise per Week: 4 days    Minutes of Exercise per Session: 40 min  Stress: No Stress Concern Present (05/29/2023)   [employer] of Occupational Health - Occupational Stress Questionnaire    Feeling of Stress : Only a little  Social Connections: Moderately Integrated (05/29/2023)   Social Connection and Isolation Panel [NHANES]    Frequency of Communication with Friends and Family: Once a week    Frequency of Social Gatherings with Friends and Family: Three times a week    Attends Religious Services: 1 to 4 times per year    Active Member of Clubs or Organizations: Yes    Attends Banker Meetings: 1 to 4 times per year    Marital Status: Widowed  Intimate Partner Violence: Not At Risk (09/11/2022)   Humiliation, Afraid, Rape, and Kick questionnaire    Fear of Current or Ex-Partner: No    Emotionally Abused: No    Physically Abused: No    Sexually Abused: No    Outpatient Medications Prior to Visit  Medication Sig Dispense Refill   amLODipine  (NORVASC ) 5 MG tablet Take 1 tablet (5 mg total) by mouth daily. 90 tablet 1   calcium carbonate (OS-CAL) 600 MG TABS Take 600 mg by mouth daily. With vitamin d      escitalopram  (LEXAPRO ) 20 MG tablet TAKE 1 TABLET BY MOUTH EVERY DAY 90 tablet 1   NON FORMULARY JUICE PLUS     famotidine (PEPCID) 20 MG tablet Take 20 mg by mouth at bedtime.     omeprazole  (PRILOSEC) 20 MG capsule TAKE 1 CAPSULE BY MOUTH EVERY DAY 90 capsule 1   No facility-administered medications prior to visit.    Allergies  Allergen Reactions   Doxycycline      GI side effects   Morphine Other (See Comments) and Nausea Only    Low blood pressure    ROS See HPI    Objective:    Physical Exam Constitutional:      General: She is not in acute distress.     Appearance: Normal appearance. She is well-developed.  HENT:     Head: Normocephalic and atraumatic.     Right Ear: External ear normal.     Left Ear: External ear normal.  Eyes:     General: No scleral icterus. Neck:     Thyroid : No thyromegaly.  Cardiovascular:     Rate and Rhythm: Normal rate and regular rhythm.     Heart sounds: Normal heart sounds. No  murmur heard. Pulmonary:     Effort: Pulmonary effort is normal. No respiratory distress.     Breath sounds: Normal breath sounds. No wheezing.  Musculoskeletal:     Cervical back: Neck supple.  Skin:    General: Skin is warm and dry.     Comments: Small wound approx 1 cm wide left shin with granulation tissue noted  Neurological:     Mental Status: She is alert and oriented to person, place, and time.  Psychiatric:        Mood and Affect: Mood normal.        Behavior: Behavior normal.        Thought Content: Thought content normal.        Judgment: Judgment normal.       BP 108/60 (BP Location: Right Arm, Patient Position: Sitting)   Pulse 76   Temp 98.4 F (36.9 C) (Oral)   Resp 16   Ht 5\' 7"  (1.702 m)   Wt 152 lb (68.9 kg)   SpO2 97%   BMI 23.81 kg/m  Wt Readings from Last 3 Encounters:  05/30/23 152 lb (68.9 kg)  05/15/23 155 lb 12.8 oz (70.7 kg)  11/29/22 150 lb (68 kg)       Assessment & Plan:   Problem List Items Addressed This Visit       Unprioritized   Skin wound from surgical incision   Completed antibiotics- no sign of infection currently.  Reassurance provided.       Osteopenia   Continue calcium and weight bearing exercise. Dexa up to date.       OSA (obstructive sleep apnea)   Stable on CPAP, seeing Tammy Parrett once a year for follow up.       Hyperlipidemia   Lab Results  Component Value Date   CHOL 213 (H) 11/29/2022   HDL 67.60 11/29/2022   LDLCALC 116 (H) 11/29/2022   LDLDIRECT 110.0 07/09/2018   TRIG 148.0 11/29/2022   CHOLHDL 3 11/29/2022   Continue healthy diet and  regular exercise.       GERD (gastroesophageal reflux disease) - Primary   Still having some symptoms with pepcid HS and prn omeprazole  20mg .  She is hesitant to continue omeprazole  due to potential side effects.  Will d/c omeprazole  and increase pepcid to bid.       Relevant Medications   famotidine (PEPCID) 20 MG tablet   Anxiety   Stable on lexapro - continue same.        I have discontinued Jhaniyah Bones. Manley's omeprazole . I have also changed her famotidine. Additionally, I am having her maintain her calcium carbonate, NON FORMULARY, amLODipine , and escitalopram .  Meds ordered this encounter  Medications   famotidine (PEPCID) 20 MG tablet    Sig: Take 1 tablet (20 mg total) by mouth 2 (two) times daily.    Dispense:  180 tablet    Refill:  1    Supervising Provider:   Randie Bustle A [[id number]]

## 2023-05-30 NOTE — Assessment & Plan Note (Signed)
 Lab Results  Component Value Date   CHOL 213 (H) 11/29/2022   HDL 67.60 11/29/2022   LDLCALC 116 (H) 11/29/2022   LDLDIRECT 110.0 07/09/2018   TRIG 148.0 11/29/2022   CHOLHDL 3 11/29/2022   Continue healthy diet and regular exercise.

## 2023-05-30 NOTE — Assessment & Plan Note (Signed)
 Completed antibiotics- no sign of infection currently.  Reassurance provided.

## 2023-07-09 ENCOUNTER — Other Ambulatory Visit: Payer: Self-pay | Admitting: Family

## 2023-07-09 DIAGNOSIS — Z1231 Encounter for screening mammogram for malignant neoplasm of breast: Secondary | ICD-10-CM

## 2023-07-19 DIAGNOSIS — Z85828 Personal history of other malignant neoplasm of skin: Secondary | ICD-10-CM | POA: Diagnosis not present

## 2023-07-19 DIAGNOSIS — L821 Other seborrheic keratosis: Secondary | ICD-10-CM | POA: Diagnosis not present

## 2023-07-19 DIAGNOSIS — L814 Other melanin hyperpigmentation: Secondary | ICD-10-CM | POA: Diagnosis not present

## 2023-07-19 DIAGNOSIS — L570 Actinic keratosis: Secondary | ICD-10-CM | POA: Diagnosis not present

## 2023-07-19 DIAGNOSIS — Z86008 Personal history of in-situ neoplasm of other site: Secondary | ICD-10-CM | POA: Diagnosis not present

## 2023-07-19 DIAGNOSIS — C44722 Squamous cell carcinoma of skin of right lower limb, including hip: Secondary | ICD-10-CM | POA: Diagnosis not present

## 2023-07-19 DIAGNOSIS — X32XXXS Exposure to sunlight, sequela: Secondary | ICD-10-CM | POA: Diagnosis not present

## 2023-07-19 DIAGNOSIS — D485 Neoplasm of uncertain behavior of skin: Secondary | ICD-10-CM | POA: Diagnosis not present

## 2023-07-19 DIAGNOSIS — D1801 Hemangioma of skin and subcutaneous tissue: Secondary | ICD-10-CM | POA: Diagnosis not present

## 2023-07-30 DIAGNOSIS — C44722 Squamous cell carcinoma of skin of right lower limb, including hip: Secondary | ICD-10-CM | POA: Diagnosis not present

## 2023-08-19 ENCOUNTER — Other Ambulatory Visit: Payer: Self-pay | Admitting: Family

## 2023-08-22 DIAGNOSIS — Z85828 Personal history of other malignant neoplasm of skin: Secondary | ICD-10-CM | POA: Diagnosis not present

## 2023-08-22 DIAGNOSIS — Z08 Encounter for follow-up examination after completed treatment for malignant neoplasm: Secondary | ICD-10-CM | POA: Diagnosis not present

## 2023-08-22 DIAGNOSIS — L570 Actinic keratosis: Secondary | ICD-10-CM | POA: Diagnosis not present

## 2023-08-22 DIAGNOSIS — L820 Inflamed seborrheic keratosis: Secondary | ICD-10-CM | POA: Diagnosis not present

## 2023-08-24 ENCOUNTER — Ambulatory Visit

## 2023-08-29 ENCOUNTER — Ambulatory Visit
Admission: RE | Admit: 2023-08-29 | Discharge: 2023-08-29 | Disposition: A | Discharge status: Home / Self Care | Source: Ambulatory Visit | Attending: Family | Admitting: Family

## 2023-08-29 ENCOUNTER — Ambulatory Visit

## 2023-08-29 DIAGNOSIS — Z1231 Encounter for screening mammogram for malignant neoplasm of breast: Secondary | ICD-10-CM

## 2023-09-01 ENCOUNTER — Ambulatory Visit: Payer: Self-pay | Admitting: Family

## 2023-09-06 DIAGNOSIS — Z85828 Personal history of other malignant neoplasm of skin: Secondary | ICD-10-CM | POA: Diagnosis not present

## 2023-09-06 DIAGNOSIS — Z08 Encounter for follow-up examination after completed treatment for malignant neoplasm: Secondary | ICD-10-CM | POA: Diagnosis not present

## 2023-09-06 DIAGNOSIS — L820 Inflamed seborrheic keratosis: Secondary | ICD-10-CM | POA: Diagnosis not present

## 2023-09-12 ENCOUNTER — Ambulatory Visit

## 2023-09-19 NOTE — Telephone Encounter (Signed)
 Copied from CRM #[id number]. Topic: Clinical - Medication Question >> Sep 18, 2023  9:42 AM Anairis L wrote: Reason for CRM: Positive for COVID 09/18/2023 started to  feel sick Friday/Sunday is requesting medication to be sent.  [id number] - [place], [place] - [address] [place] [address] Phone: [phone] Fax: [phone] Hours: Not open 24 hours

## 2023-09-19 NOTE — Telephone Encounter (Signed)
 Per patient she is feeling bette and no need for medications

## 2023-10-09 ENCOUNTER — Ambulatory Visit

## 2023-10-11 DIAGNOSIS — Z23 Encounter for immunization: Secondary | ICD-10-CM | POA: Diagnosis not present

## 2023-10-12 ENCOUNTER — Ambulatory Visit: Admitting: *Deleted

## 2023-10-12 VITALS — Ht 67.0 in | Wt 149.0 lb

## 2023-10-12 DIAGNOSIS — Z0000 Encounter for general adult medical examination without abnormal findings: Secondary | ICD-10-CM

## 2023-10-12 NOTE — Patient Instructions (Addendum)
 Megan Middleton , Thank you for taking time out of your busy schedule to complete your Annual Wellness Visit with me. I enjoyed our conversation and look forward to speaking with you again next year. I, as well as your care team,  appreciate your ongoing commitment to your health goals. Please review the following plan we discussed and let me know if I can assist you in the future. Your Game plan/ To Do List     Follow up Visits: Next Medicare AWV with our clinical staff: 10/17/23 9:40am, telephone    Next Office Visit with your provider: 11/30/23 10:40am, Eleanor Ponto, NP  Clinician Recommendations:  Aim for 30 minutes of exercise or brisk walking, 6-8 glasses of water, and 5 servings of fruits and vegetables each day.       This is a list of the screening recommended for you and due dates:  Health Maintenance  Topic Date Due   COVID-19 Vaccine (4 - 2025-26 season) 10/11/2024*   Breast Cancer Screening  08/28/2024   Medicare Annual Wellness Visit  10/11/2024   DTaP/Tdap/Td vaccine (3 - Td or Tdap) 11/11/2029   Colon Cancer Screening  04/04/2032   Pneumococcal Vaccine for age over 21  Completed   Flu Shot  Completed   DEXA scan (bone density measurement)  Completed   Zoster (Shingles) Vaccine  Completed   HPV Vaccine  Aged Out   Meningitis B Vaccine  Aged Out   Hepatitis C Screening  Discontinued  *Topic was postponed. The date shown is not the original due date.    Advanced directives: (Copy Requested) Please bring a copy of your health care power of attorney and living will to the office to be added to your chart at your convenience. You can mail to [facility] [address] [place], [place] [address] or email to [email or web address] Advance Care Planning is important because it:  [x]  Makes sure you receive the medical care that is consistent with your values, goals, and preferences  [x]  It provides guidance to your family and loved ones and reduces their  decisional burden about whether or not they are making the right decisions based on your wishes.  Follow the link provided in your after visit summary or read over the paperwork we have mailed to you to help you started getting your Advance Directives in place. If you need assistance in completing these, please reach out to us  so that we can help you!  See attachments for Preventive Care and Fall Prevention Tips.

## 2023-10-12 NOTE — Progress Notes (Signed)
 Subjective:   Megan Middleton is a 68 y.o. who presents for a Medicare Wellness preventive visit.  As a reminder, Annual Wellness Visits don't include a physical exam, and some assessments may be limited, especially if this visit is performed virtually. We may recommend an in-person follow-up visit with your provider if needed.  Visit Complete: Virtual I connected with  Megan Middleton on 10/12/23 by a audio enabled telemedicine application and verified that I am speaking with the correct person using two identifiers.  Patient Location: Home  Provider Location: Office/Clinic  I discussed the limitations of evaluation and management by telemedicine. The patient expressed understanding and agreed to proceed.  Vital Signs: Because this visit was a virtual/telehealth visit, some criteria may be missing or patient reported. Any vitals not documented were not able to be obtained and vitals that have been documented are patient reported.  VideoDeclined- This patient declined Librarian, academic. Therefore the visit was completed with audio only.  Persons Participating in Visit: Patient.  AWV Questionnaire: Yes: Patient Medicare AWV questionnaire was completed by the patient on 10/05/23; I have confirmed that all information answered by patient is correct and no changes since this date.  Cardiac Risk Factors include: advanced age (>69men, >9 women);dyslipidemia;hypertension;Other (see comment), Risk factor comments: OSA     Objective:    Today's Vitals   10/12/23 1023  Weight: 149 lb (67.6 kg)  Height: 5' 7 (1.702 m)   Body mass index is 23.34 kg/m.     10/12/2023   10:31 AM 09/11/2022    9:38 AM 08/30/2021    3:43 PM 08/19/2020    8:15 AM 08/13/2019    8:11 AM 03/08/2014   12:09 PM 10/08/2013    7:48 AM  Advanced Directives  Does Patient Have a Medical Advance Directive? Yes Yes Yes Yes Yes No  Yes   Type of Estate agent of  [place];Living will Healthcare Power of [place];Living will Healthcare Power of [place];Living will Healthcare Power of [place];Living will Healthcare Power of [place];Living will  Healthcare Power of Attorney   Does patient want to make changes to medical advance directive? No - Patient declined No - Patient declined No - Patient declined  No - Patient declined    Copy of Healthcare Power of Attorney in Chart? No - copy requested No - copy requested No - copy requested No - copy requested No - copy requested    Would patient like information on creating a medical advance directive?      No - patient declined information       Data saved with a previous flowsheet row definition    Current Medications (verified) Outpatient Encounter Medications as of 10/12/2023  Medication Sig   amLODipine  (NORVASC ) 5 MG tablet Take 1 tablet (5 mg total) by mouth daily.   calcium carbonate (OS-CAL) 600 MG TABS Take 600 mg by mouth daily. With vitamin d    escitalopram  (LEXAPRO ) 20 MG tablet TAKE 1 TABLET BY MOUTH EVERY DAY   famotidine  (PEPCID ) 20 MG tablet Take 1 tablet (20 mg total) by mouth 2 (two) times daily. (Patient taking differently: Take 20 mg by mouth 2 (two) times daily. Only takes once a day)   NON FORMULARY JUICE PLUS   OVER THE COUNTER MEDICATION Take 1 capsule by mouth daily. TOTAL BEETS   No facility-administered encounter medications on file as of 10/12/2023.    Allergies (verified) Doxycycline  and Morphine   History: Past Medical History:  Diagnosis  Date   Allergy    seasonal   Basal cell carcinoma 2023   left temple, sees dermatolog   Gilberts syndrome    Gynecological examination    sees Dr. Nathanel Bunker    Hyperlipidemia    Hypertension    Osteopenia 10/02/2020   Squamous cell carcinoma    Sees Derm Bernarda Rao   Tubular adenoma of colon 2015   Past Surgical History:  Procedure Laterality Date   collar bone surgery     jan,2020   COLONOSCOPY  10/08/2013   per  Dr. Aneita, adenomatous polyps, repear in 5 yrs   FRACTURE SURGERY  January 2020   Clavicle left   TUBAL LIGATION     Family History  Problem Relation Age of Onset   Hypertension Father    Breast cancer Sister    Lymphoma Mother    Leukemia Mother    Cancer Mother    Colon cancer Neg Hx    Esophageal cancer Neg Hx    Rectal cancer Neg Hx    Stomach cancer Neg Hx    Colon polyps Neg Hx    Social History   Socioeconomic History   Marital status: Widowed    Spouse name: Not on file   Number of children: Not on file   Years of education: Not on file   Highest education level: Associate degree: academic program  Occupational History   Not on file  Tobacco Use   Smoking status: Never   Smokeless tobacco: Never  Vaping Use   Vaping status: Not on file  Substance and Sexual Activity   Alcohol use: Yes    Alcohol/week: 0.0 standard drinks of alcohol    Comment: occ / social   Drug use: No   Sexual activity: Not Currently  Other Topics Concern   Not on file  Social History Narrative   Originally from [place]   Retired from school system- Diplomatic Services operational officer for [employer]   Completed 2 year community college   Has one daughter- Marjorie Chu   2 grandchildren   Enjoys volunteering, GYM, crafts, reading   Social Drivers of Health   Financial Resource Strain: Low Risk  (10/05/2023)   Overall Financial Resource Strain (CARDIA)    Difficulty of Paying Living Expenses: Not hard at all  Food Insecurity: No Food Insecurity (10/05/2023)   Hunger Vital Sign    Worried About Running Out of Food in the Last Year: Never true    Ran Out of Food in the Last Year: Never true  Transportation Needs: No Transportation Needs (10/05/2023)   PRAPARE - Administrator, Civil Service (Medical): No    Lack of Transportation (Non-Medical): No  Physical Activity: Insufficiently Active (10/05/2023)   Exercise Vital Sign    Days of Exercise per Week: 3 days    Minutes of Exercise per Session:  40 min  Stress: No Stress Concern Present (10/05/2023)   [employer] of Occupational Health - Occupational Stress Questionnaire    Feeling of Stress: Only a little  Social Connections: Moderately Integrated (10/05/2023)   Social Connection and Isolation Panel    Frequency of Communication with Friends and Family: Twice a week    Frequency of Social Gatherings with Friends and Family: Three times a week    Attends Religious Services: More than 4 times per year    Active Member of Clubs or Organizations: Yes    Attends Banker Meetings: 1 to 4 times per year  Marital Status: Widowed    Tobacco Counseling Counseling given: Not Answered    Clinical Intake:  Pre-visit preparation completed: Yes  Pain : No/denies pain     BMI - recorded: 23.34 Nutritional Status: BMI of 19-24  Normal Nutritional Risks: None Diabetes: No  Lab Results  Component Value Date   HGBA1C 5.3 10/07/2019   HGBA1C 5.2 04/03/2016     How often do you need to have someone help you when you read instructions, pamphlets, or other written materials from your doctor or pharmacy?: 1 - Never What is the last grade level you completed in school?: associate's degree  Interpreter Needed?: No  Information entered by :: Lolita Libra, CMA(AAMA)   Activities of Daily Living     10/05/2023    4:18 PM  In your present state of health, do you have any difficulty performing the following activities:  Hearing? 0  Vision? 0  Difficulty concentrating or making decisions? 0  Walking or climbing stairs? 0  Dressing or bathing? 0  Doing errands, shopping? 0  Preparing Food and eating ? N  Using the Toilet? N  In the past six months, have you accidently leaked urine? N  Do you have problems with loss of bowel control? N  Managing your Medications? N  Managing your Finances? N  Housekeeping or managing your Housekeeping? N    Patient Care Team: Daryl Setter, NP as PCP - General  (Internal Medicine) Estelle Service, MD as Consulting Physician (Obstetrics and Gynecology) Shari Sieving, MD as Consulting Physician (Orthopedic Surgery) Latisha Omega Darleene DELENA, OD (Ophthalmology)  I have updated your Care Teams any recent Medical Services you may have received from other providers in the past year.     Assessment:   This is a routine wellness examination for Megan Middleton.  Hearing/Vision screen Hearing Screening - Comments:: Denies hearing difficulties.  Vision Screening - Comments:: Up to date with routine eye exams with Dr Latisha    Goals Addressed               This Visit's Progress     Patient Stated (pt-stated)        To go to the gym or walk 4-5 days       Depression Screen     10/12/2023   10:29 AM 09/11/2022    9:48 AM 08/30/2021    3:44 PM 08/19/2020    8:17 AM 08/13/2019    8:14 AM 07/25/2019    9:35 AM 04/23/2017   10:08 AM  PHQ 2/9 Scores  PHQ - 2 Score 0 0 0 0 0 0 0  PHQ- 9 Score 1     4 1     Fall Risk     10/05/2023    4:18 PM 09/11/2022    9:38 AM 08/30/2021    3:43 PM 08/19/2020    8:17 AM 08/13/2019    8:14 AM  Fall Risk   Falls in the past year? 0 0 0 0 0  Number falls in past yr: 0 0 0 0 0  Injury with Fall? 0 0 0 0 0  Risk for fall due to :  No Fall Risks No Fall Risks    Follow up  Falls evaluation completed Falls evaluation completed  Falls prevention discussed  Education provided;Falls prevention discussed      Data saved with a previous flowsheet row definition    MEDICARE RISK AT HOME:  Medicare Risk at Home Any stairs in or around the home?: (Patient-Rptd) No  Home free of loose throw rugs in walkways, pet beds, electrical cords, etc?: (Patient-Rptd) Yes Adequate lighting in your home to reduce risk of falls?: (Patient-Rptd) Yes Life alert?: (Patient-Rptd) No Use of a cane, walker or w/c?: (Patient-Rptd) No Grab bars in the bathroom?: (Patient-Rptd) No Shower chair or bench in shower?: (Patient-Rptd) No Elevated toilet  seat or a handicapped toilet?: (Patient-Rptd) No  TIMED UP AND GO:  Was the test performed?  No,audio  Cognitive Function: 6CIT completed        10/12/2023   10:32 AM 09/11/2022    9:49 AM 08/30/2021    3:50 PM  6CIT Screen  What Year? 0 points 0 points 0 points  What month? 0 points 0 points 0 points  What time? 0 points 0 points 0 points  Count back from 20 0 points 0 points 0 points  Months in reverse 0 points 0 points 0 points  Repeat phrase 0 points 0 points 2 points  Total Score 0 points 0 points 2 points    Immunizations Immunization History  Administered Date(s) Administered   Fluad Quad(high Dose 65+) 10/09/2018, 10/07/2019, 10/31/2020, 10/05/2021   INFLUENZA, HIGH DOSE SEASONAL PF 10/30/2016, 10/23/2022, 10/11/2023   Influenza Split 11/09/2010, 11/10/2011, 11/05/2012   Influenza,inj,Quad PF,6+ Mos 10/16/2017   Influenza-Unspecified 11/06/2013, 11/26/2014, 10/27/2015   PFIZER(Purple Top)SARS-COV-2 Vaccination 02/12/2019, 03/05/2019, 11/27/2019   Pneumococcal Conjugate-13 07/09/2018   Pneumococcal Polysaccharide-23 11/29/2022   Rabies Immune Globulin 11/12/2019, 11/15/2019, 11/19/2019, 11/26/2019   Tdap 09/24/2010, 11/12/2019   Zoster Recombinant(Shingrix ) 10/16/2017, 12/24/2017   Zoster, Live 04/27/2014    Screening Tests Health Maintenance  Topic Date Due   COVID-19 Vaccine (4 - 2025-26 season) 10/11/2024 (Originally 09/24/2023)   Mammogram  08/28/2024   Medicare Annual Wellness (AWV)  10/11/2024   DTaP/Tdap/Td (3 - Td or Tdap) 11/11/2029   Colonoscopy  04/04/2032   Pneumococcal Vaccine: 50+ Years  Completed   Influenza Vaccine  Completed   DEXA SCAN  Completed   Zoster Vaccines- Shingrix   Completed   HPV VACCINES  Aged Out   Meningococcal B Vaccine  Aged Out   Hepatitis C Screening  Discontinued    Health Maintenance Items Addressed: HM up to date  Additional Screening:  Vision Screening: Recommended annual ophthalmology exams for early detection of  glaucoma and other disorders of the eye. Is the patient up to date with their annual eye exam?  Yes  Who is the provider or what is the name of the office in which the patient attends annual eye exams? Dr Latisha  Dental Screening: Recommended annual dental exams for proper oral hygiene  Community Resource Referral / Chronic Care Management: CRR required this visit?  No   CCM required this visit?  No   Plan:    I have personally reviewed and noted the following in the patient's chart:   Medical and social history Use of alcohol, tobacco or illicit drugs  Current medications and supplements including opioid prescriptions. Patient is not currently taking opioid prescriptions. Functional ability and status Nutritional status Physical activity Advanced directives List of other physicians Hospitalizations, surgeries, and ER visits in previous 12 months Vitals Screenings to include cognitive, depression, and falls Referrals and appointments  In addition, I have reviewed and discussed with patient certain preventive protocols, quality metrics, and best practice recommendations. A written personalized care plan for preventive services as well as general preventive health recommendations were provided to patient.   Lolita Libra, CMA   10/12/2023   After Visit Summary: (MyChart) Due  to this being a telephonic visit, the after visit summary with patients personalized plan was offered to patient via MyChart   Notes: Nothing significant to report at this time.

## 2023-11-11 ENCOUNTER — Other Ambulatory Visit: Payer: Self-pay | Admitting: Family

## 2023-11-21 DIAGNOSIS — L570 Actinic keratosis: Secondary | ICD-10-CM | POA: Diagnosis not present

## 2023-11-30 ENCOUNTER — Encounter: Payer: Self-pay | Admitting: Family

## 2023-11-30 ENCOUNTER — Ambulatory Visit: Admitting: Family

## 2023-11-30 VITALS — BP 116/64 | HR 75 | Temp 98.2°F | Resp 16 | Ht 67.0 in | Wt 151.4 lb

## 2023-11-30 DIAGNOSIS — E785 Hyperlipidemia, unspecified: Secondary | ICD-10-CM | POA: Diagnosis not present

## 2023-11-30 DIAGNOSIS — M8580 Other specified disorders of bone density and structure, unspecified site: Secondary | ICD-10-CM

## 2023-11-30 DIAGNOSIS — I10 Essential (primary) hypertension: Secondary | ICD-10-CM

## 2023-11-30 DIAGNOSIS — F419 Anxiety disorder, unspecified: Secondary | ICD-10-CM

## 2023-11-30 DIAGNOSIS — K219 Gastro-esophageal reflux disease without esophagitis: Secondary | ICD-10-CM | POA: Diagnosis not present

## 2023-11-30 LAB — COMPREHENSIVE METABOLIC PANEL WITH GFR
ALT: 14 U/L (ref 0–35)
AST: 17 U/L (ref 0–37)
Albumin: 4.8 g/dL (ref 3.5–5.2)
Alkaline Phosphatase: 92 U/L (ref 39–117)
BUN: 12 mg/dL (ref 6–23)
CO2: 31 meq/L (ref 19–32)
Calcium: 9.3 mg/dL (ref 8.4–10.5)
Chloride: 101 meq/L (ref 96–112)
Creatinine, Ser: 0.81 mg/dL (ref 0.40–1.20)
GFR: 73.47 mL/min (ref >60.00)
Glucose, Bld: 87 mg/dL (ref 70–99)
Potassium: 3.8 meq/L (ref 3.5–5.1)
Sodium: 143 meq/L (ref 135–145)
Total Bilirubin: 2.3 mg/dL — ABNORMAL HIGH (ref 0.2–1.2)
Total Protein: 6.9 g/dL (ref 6.0–8.3)

## 2023-11-30 LAB — LIPID PANEL
Cholesterol: 245 mg/dL — ABNORMAL HIGH (ref 0–200)
HDL: 74.6 mg/dL (ref >39.00)
LDL Cholesterol: 146 mg/dL — ABNORMAL HIGH (ref 0–99)
NonHDL: 169.91
Total CHOL/HDL Ratio: 3
Triglycerides: 119 mg/dL (ref 0.0–149.0)
VLDL: 23.8 mg/dL (ref 0.0–40.0)

## 2023-11-30 NOTE — Assessment & Plan Note (Signed)
 Dexa up to date.  Continues oscal.

## 2023-11-30 NOTE — Assessment & Plan Note (Signed)
 Off of omeprazole , managing with pepcid  bid.  Recommend tums prn.

## 2023-11-30 NOTE — Assessment & Plan Note (Signed)
BP stable on amlodipine. Continue same.  

## 2023-11-30 NOTE — Assessment & Plan Note (Signed)
 Stable on lexapro .

## 2023-11-30 NOTE — Progress Notes (Signed)
 Subjective:     Patient ID: Megan Middleton, female    DOB: 06-May-1955, 68 y.o.   MRN: [id number]  Chief Complaint  Patient presents with   Follow-up    Patient is here for her 6 month follow up    HPI  Discussed the use of AI scribe software for clinical note transcription with the patient, who gave verbal consent to proceed.  History of Present Illness  Megan Middleton is a 68 year old female who presents for a routine follow-up visit. She reports that she is doing well for the most part off of prilosec, but taking pepcid  bid. Has gerd breakthrough symptoms every couple of days.   Continues good compliance with her CPAP.  Anxiety remains well controlled on lexapro .   She has received the flu vaccine for this season but has not yet received the COVID vaccine.     There are no preventive care reminders to display for this patient.  Past Medical History:  Diagnosis Date   Allergy    seasonal   Basal cell carcinoma 2023   left temple, sees dermatolog   Gilberts syndrome    Gynecological examination    sees Dr. Nathanel Bunker    Hyperlipidemia    Hypertension    Osteopenia 10/02/2020   Squamous cell carcinoma    Sees Derm Bernarda Rao   Tubular adenoma of colon 2015    Past Surgical History:  Procedure Laterality Date   collar bone surgery     jan,2020   COLONOSCOPY  10/08/2013   per Dr. Aneita, adenomatous polyps, repear in 5 yrs   FRACTURE SURGERY  January 2020   Clavicle left   TUBAL LIGATION      Family History  Problem Relation Age of Onset   Hypertension Father    Breast cancer Sister    Lymphoma Mother    Leukemia Mother    Cancer Mother    Colon cancer Neg Hx    Esophageal cancer Neg Hx    Rectal cancer Neg Hx    Stomach cancer Neg Hx    Colon polyps Neg Hx     Social History   Socioeconomic History   Marital status: Widowed    Spouse name: Not on file   Number of children: Not on file   Years of education: Not on  file   Highest education level: Associate degree: academic program  Occupational History   Not on file  Tobacco Use   Smoking status: Never   Smokeless tobacco: Never  Vaping Use   Vaping status: Not on file  Substance and Sexual Activity   Alcohol use: Yes    Alcohol/week: 0.0 standard drinks of alcohol    Comment: occ / social   Drug use: No   Sexual activity: Not Currently  Other Topics Concern   Not on file  Social History Narrative   Originally from [place]   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[employer]   Completed 2 year community college   Has one daughter- Marjorie Chu   2 grandchildren   Enjoys volunteering, GYM, crafts, reading   Social Drivers of Health   Financial Resource Strain: Low Risk  (11/29/2023)   Overall Financial Resource Strain (CARDIA)    Difficulty of Paying Living Expenses: Not hard at all  Food Insecurity: No Food Insecurity (11/29/2023)   Hunger Vital Sign    Worried About Running Out of Food in the Last Year: Never true  Ran Out of Food in the Last Year: Never true  Transportation Needs: No Transportation Needs (11/29/2023)   PRAPARE - Administrator, Civil Service (Medical): No    Lack of Transportation (Non-Medical): No  Physical Activity: Insufficiently Active (11/29/2023)   Exercise Vital Sign    Days of Exercise per Week: 4 days    Minutes of Exercise per Session: 30 min  Stress: No Stress Concern Present (11/29/2023)   [employer] of Occupational Health - Occupational Stress Questionnaire    Feeling of Stress: Only a little  Social Connections: Moderately Integrated (11/29/2023)   Social Connection and Isolation Panel    Frequency of Communication with Friends and Family: Once a week    Frequency of Social Gatherings with Friends and Family: More than three times a week    Attends Religious Services: More than 4 times per year    Active Member of [employer] or Organizations: Yes    Attends Banker  Meetings: More than 4 times per year    Marital Status: Widowed  Intimate Partner Violence: Not At Risk (10/12/2023)   Humiliation, Afraid, Rape, and Kick questionnaire    Fear of Current or Ex-Partner: No    Emotionally Abused: No    Physically Abused: No    Sexually Abused: No    Outpatient Medications Prior to Visit  Medication Sig Dispense Refill   amLODipine  (NORVASC ) 5 MG tablet Take 1 tablet (5 mg total) by mouth daily. 90 tablet 1   calcium carbonate (OS-CAL) 600 MG TABS Take 600 mg by mouth daily. With vitamin d      escitalopram  (LEXAPRO ) 20 MG tablet TAKE 1 TABLET BY MOUTH EVERY DAY 90 tablet 1   famotidine  (PEPCID ) 20 MG tablet Take 1 tablet (20 mg total) by mouth 2 (two) times daily. (Patient taking differently: Take 20 mg by mouth 2 (two) times daily. Only takes once a day) 180 tablet 1   NON FORMULARY JUICE PLUS     OVER THE COUNTER MEDICATION Take 1 capsule by mouth daily. TOTAL BEETS     No facility-administered medications prior to visit.    Allergies  Allergen Reactions   Doxycycline      GI side effects   Morphine Other (See Comments) and Nausea Only    Low blood pressure    ROS See HPI    Objective:    Physical Exam Constitutional:      General: She is not in acute distress.    Appearance: Normal appearance. She is well-developed.  HENT:     Head: Normocephalic and atraumatic.     Right Ear: External ear normal.     Left Ear: External ear normal.  Eyes:     General: No scleral icterus. Neck:     Thyroid : No thyromegaly.  Cardiovascular:     Rate and Rhythm: Normal rate and regular rhythm.     Heart sounds: Normal heart sounds. No murmur heard. Pulmonary:     Effort: Pulmonary effort is normal. No respiratory distress.     Breath sounds: Normal breath sounds. No wheezing.  Musculoskeletal:     Cervical back: Neck supple.  Skin:    General: Skin is warm and dry.  Neurological:     Mental Status: She is alert and oriented to person, place, and  time.  Psychiatric:        Mood and Affect: Mood normal.        Behavior: Behavior normal.  Thought Content: Thought content normal.        Judgment: Judgment normal.      BP 116/64 (BP Location: Right Arm, Patient Position: Sitting, Cuff Size: Normal)   Pulse 75   Temp 98.2 F (36.8 C) (Oral)   Resp 16   Ht 5' 7 (1.702 m)   Wt 151 lb 6.4 oz (68.7 kg)   SpO2 98%   BMI 23.71 kg/m  Wt Readings from Last 3 Encounters:  11/30/23 151 lb 6.4 oz (68.7 kg)  10/12/23 149 lb (67.6 kg)  05/30/23 152 lb (68.9 kg)       Assessment & Plan:   Problem List Items Addressed This Visit       Unprioritized   Osteopenia   Dexa up to date.  Continues oscal.       Hyperlipidemia   Relevant Orders   Comp Met (CMET)   Lipid panel   HTN (hypertension)   BP stable on amlodipine . Continue same.       GERD (gastroesophageal reflux disease) - Primary   Off of omeprazole , managing with pepcid  bid.  Recommend tums prn.       Anxiety   Stable on lexapro .        I am having [place] CANDIE Mitchell Rhoda maintain her calcium carbonate, NON FORMULARY, famotidine , amLODipine , OVER THE COUNTER MEDICATION, and escitalopram .  No orders of the defined types were placed in this encounter.

## 2023-12-03 ENCOUNTER — Ambulatory Visit: Payer: Self-pay | Admitting: Family

## 2023-12-24 DIAGNOSIS — H2511 Age-related nuclear cataract, right eye: Secondary | ICD-10-CM | POA: Diagnosis not present

## 2023-12-24 DIAGNOSIS — H2513 Age-related nuclear cataract, bilateral: Secondary | ICD-10-CM | POA: Diagnosis not present

## 2023-12-24 DIAGNOSIS — Z01818 Encounter for other preprocedural examination: Secondary | ICD-10-CM | POA: Diagnosis not present

## 2023-12-25 DIAGNOSIS — L570 Actinic keratosis: Secondary | ICD-10-CM | POA: Diagnosis not present

## 2024-01-10 DIAGNOSIS — H25811 Combined forms of age-related cataract, right eye: Secondary | ICD-10-CM | POA: Diagnosis not present

## 2024-02-13 ENCOUNTER — Other Ambulatory Visit: Payer: Self-pay | Admitting: Family

## 2024-05-30 ENCOUNTER — Ambulatory Visit: Admitting: Family

## 2024-10-16 ENCOUNTER — Ambulatory Visit
